# Patient Record
Sex: Female | Born: 1955 | Race: White | Hispanic: No | Marital: Married | Smoking: Current every day smoker
Health system: Southern US, Community
[De-identification: ages and names within clinical notes are randomized; demographics above are authoritative.]

## PROBLEM LIST (undated history)

## (undated) DIAGNOSIS — E785 Hyperlipidemia, unspecified: Secondary | ICD-10-CM

## (undated) DIAGNOSIS — I2510 Atherosclerotic heart disease of native coronary artery without angina pectoris: Secondary | ICD-10-CM

## (undated) DIAGNOSIS — N809 Endometriosis, unspecified: Secondary | ICD-10-CM

## (undated) DIAGNOSIS — I959 Hypotension, unspecified: Secondary | ICD-10-CM

## (undated) DIAGNOSIS — C4490 Unspecified malignant neoplasm of skin, unspecified: Secondary | ICD-10-CM

## (undated) DIAGNOSIS — M5020 Other cervical disc displacement, unspecified cervical region: Secondary | ICD-10-CM

## (undated) DIAGNOSIS — I10 Essential (primary) hypertension: Secondary | ICD-10-CM

## (undated) DIAGNOSIS — Z720 Tobacco use: Secondary | ICD-10-CM

## (undated) HISTORY — PX: ABDOMINAL HYSTERECTOMY: SHX81

## (undated) HISTORY — PX: CARDIAC CATHETERIZATION: SHX172

## (undated) HISTORY — PX: TONSILLECTOMY: SUR1361

## (undated) HISTORY — PX: CHOLECYSTECTOMY: SHX55

---

## 1998-08-06 ENCOUNTER — Encounter: Payer: Self-pay | Admitting: Neurosurgery

## 1998-08-06 ENCOUNTER — Ambulatory Visit (HOSPITAL_COMMUNITY): Admission: RE | Admit: 1998-08-06 | Discharge: 1998-08-06 | Payer: Self-pay | Admitting: Neurosurgery

## 1998-08-15 ENCOUNTER — Ambulatory Visit (HOSPITAL_COMMUNITY): Admission: RE | Admit: 1998-08-15 | Discharge: 1998-08-15 | Payer: Self-pay | Admitting: Neurosurgery

## 1998-09-10 ENCOUNTER — Encounter
Admission: RE | Admit: 1998-09-10 | Discharge: 1998-12-09 | Payer: Self-pay | Admitting: Physical Medicine & Rehabilitation

## 2002-01-29 ENCOUNTER — Emergency Department (HOSPITAL_COMMUNITY): Admission: EM | Admit: 2002-01-29 | Discharge: 2002-01-29 | Payer: Self-pay | Admitting: *Deleted

## 2002-01-29 ENCOUNTER — Encounter: Payer: Self-pay | Admitting: *Deleted

## 2002-08-21 ENCOUNTER — Encounter: Payer: Self-pay | Admitting: General Surgery

## 2002-08-21 ENCOUNTER — Encounter: Payer: Self-pay | Admitting: Emergency Medicine

## 2002-08-21 ENCOUNTER — Inpatient Hospital Stay (HOSPITAL_COMMUNITY): Admission: EM | Admit: 2002-08-21 | Discharge: 2002-08-25 | Payer: Self-pay | Admitting: Emergency Medicine

## 2003-01-31 ENCOUNTER — Encounter: Payer: Self-pay | Admitting: General Surgery

## 2003-01-31 ENCOUNTER — Ambulatory Visit (HOSPITAL_COMMUNITY): Admission: RE | Admit: 2003-01-31 | Discharge: 2003-01-31 | Payer: Self-pay | Admitting: General Surgery

## 2003-02-13 ENCOUNTER — Ambulatory Visit (HOSPITAL_COMMUNITY): Admission: RE | Admit: 2003-02-13 | Discharge: 2003-02-13 | Payer: Self-pay | Admitting: General Surgery

## 2003-02-14 ENCOUNTER — Ambulatory Visit (HOSPITAL_COMMUNITY): Admission: RE | Admit: 2003-02-14 | Discharge: 2003-02-14 | Payer: Self-pay | Admitting: General Surgery

## 2003-02-14 ENCOUNTER — Encounter: Payer: Self-pay | Admitting: General Surgery

## 2003-02-18 ENCOUNTER — Encounter (HOSPITAL_COMMUNITY): Admission: RE | Admit: 2003-02-18 | Discharge: 2003-03-20 | Payer: Self-pay | Admitting: General Surgery

## 2003-02-18 ENCOUNTER — Encounter: Payer: Self-pay | Admitting: General Surgery

## 2003-03-15 ENCOUNTER — Observation Stay (HOSPITAL_COMMUNITY): Admission: RE | Admit: 2003-03-15 | Discharge: 2003-03-16 | Payer: Self-pay | Admitting: General Surgery

## 2005-07-16 ENCOUNTER — Emergency Department (HOSPITAL_COMMUNITY): Admission: EM | Admit: 2005-07-16 | Discharge: 2005-07-16 | Payer: Self-pay | Admitting: Emergency Medicine

## 2006-01-13 ENCOUNTER — Encounter (INDEPENDENT_AMBULATORY_CARE_PROVIDER_SITE_OTHER): Payer: Self-pay | Admitting: Family Medicine

## 2006-01-25 ENCOUNTER — Ambulatory Visit (HOSPITAL_COMMUNITY): Admission: RE | Admit: 2006-01-25 | Discharge: 2006-01-25 | Payer: Self-pay | Admitting: Preventative Medicine

## 2006-01-28 ENCOUNTER — Ambulatory Visit (HOSPITAL_COMMUNITY): Admission: RE | Admit: 2006-01-28 | Discharge: 2006-01-28 | Payer: Self-pay | Admitting: General Surgery

## 2006-03-30 ENCOUNTER — Encounter: Payer: Self-pay | Admitting: Neurosurgery

## 2006-07-05 ENCOUNTER — Encounter (INDEPENDENT_AMBULATORY_CARE_PROVIDER_SITE_OTHER): Payer: Self-pay | Admitting: Family Medicine

## 2006-07-14 ENCOUNTER — Ambulatory Visit: Payer: Self-pay | Admitting: Family Medicine

## 2006-07-15 ENCOUNTER — Encounter (INDEPENDENT_AMBULATORY_CARE_PROVIDER_SITE_OTHER): Payer: Self-pay | Admitting: Family Medicine

## 2006-07-15 LAB — CONVERTED CEMR LAB
Cholesterol: 242 mg/dL
HDL: 44 mg/dL
LDL Cholesterol: 159 mg/dL

## 2006-07-25 ENCOUNTER — Ambulatory Visit: Payer: Self-pay | Admitting: Family Medicine

## 2006-07-25 ENCOUNTER — Ambulatory Visit (HOSPITAL_COMMUNITY): Admission: RE | Admit: 2006-07-25 | Discharge: 2006-07-25 | Payer: Self-pay | Admitting: Family Medicine

## 2006-07-26 ENCOUNTER — Ambulatory Visit: Payer: Self-pay | Admitting: Family Medicine

## 2006-08-16 ENCOUNTER — Ambulatory Visit: Payer: Self-pay | Admitting: Family Medicine

## 2006-09-23 ENCOUNTER — Inpatient Hospital Stay (HOSPITAL_COMMUNITY): Admission: EM | Admit: 2006-09-23 | Discharge: 2006-09-25 | Payer: Self-pay | Admitting: Emergency Medicine

## 2006-09-30 ENCOUNTER — Encounter (INDEPENDENT_AMBULATORY_CARE_PROVIDER_SITE_OTHER): Payer: Self-pay | Admitting: Family Medicine

## 2006-10-19 DIAGNOSIS — J45909 Unspecified asthma, uncomplicated: Secondary | ICD-10-CM | POA: Insufficient documentation

## 2006-10-19 DIAGNOSIS — M545 Low back pain, unspecified: Secondary | ICD-10-CM | POA: Insufficient documentation

## 2006-10-19 DIAGNOSIS — J42 Unspecified chronic bronchitis: Secondary | ICD-10-CM | POA: Insufficient documentation

## 2006-10-19 DIAGNOSIS — R7989 Other specified abnormal findings of blood chemistry: Secondary | ICD-10-CM | POA: Insufficient documentation

## 2006-10-19 DIAGNOSIS — F17200 Nicotine dependence, unspecified, uncomplicated: Secondary | ICD-10-CM | POA: Insufficient documentation

## 2006-10-19 DIAGNOSIS — I2510 Atherosclerotic heart disease of native coronary artery without angina pectoris: Secondary | ICD-10-CM

## 2006-10-19 DIAGNOSIS — E785 Hyperlipidemia, unspecified: Secondary | ICD-10-CM

## 2006-10-28 ENCOUNTER — Ambulatory Visit: Payer: Self-pay | Admitting: Family Medicine

## 2007-01-27 ENCOUNTER — Ambulatory Visit: Payer: Self-pay | Admitting: Family Medicine

## 2007-01-27 DIAGNOSIS — K219 Gastro-esophageal reflux disease without esophagitis: Secondary | ICD-10-CM

## 2007-01-27 LAB — CONVERTED CEMR LAB

## 2007-02-06 ENCOUNTER — Ambulatory Visit (HOSPITAL_COMMUNITY): Admission: RE | Admit: 2007-02-06 | Discharge: 2007-02-06 | Payer: Self-pay | Admitting: Family Medicine

## 2007-02-06 ENCOUNTER — Encounter (INDEPENDENT_AMBULATORY_CARE_PROVIDER_SITE_OTHER): Payer: Self-pay | Admitting: Family Medicine

## 2007-02-14 ENCOUNTER — Encounter (INDEPENDENT_AMBULATORY_CARE_PROVIDER_SITE_OTHER): Payer: Self-pay | Admitting: Family Medicine

## 2007-04-20 ENCOUNTER — Encounter (INDEPENDENT_AMBULATORY_CARE_PROVIDER_SITE_OTHER): Payer: Self-pay | Admitting: Family Medicine

## 2007-04-25 ENCOUNTER — Ambulatory Visit: Payer: Self-pay | Admitting: Family Medicine

## 2007-04-25 LAB — CONVERTED CEMR LAB: HDL goal, serum: 40 mg/dL

## 2007-06-06 ENCOUNTER — Ambulatory Visit: Payer: Self-pay | Admitting: Family Medicine

## 2007-06-06 DIAGNOSIS — G2581 Restless legs syndrome: Secondary | ICD-10-CM | POA: Insufficient documentation

## 2007-06-09 ENCOUNTER — Telehealth (INDEPENDENT_AMBULATORY_CARE_PROVIDER_SITE_OTHER): Payer: Self-pay | Admitting: *Deleted

## 2007-06-09 ENCOUNTER — Encounter (INDEPENDENT_AMBULATORY_CARE_PROVIDER_SITE_OTHER): Payer: Self-pay | Admitting: Family Medicine

## 2007-06-09 LAB — CONVERTED CEMR LAB
Albumin: 4.4 g/dL (ref 3.5–5.2)
CO2: 22 meq/L (ref 19–32)
Calcium: 9.5 mg/dL (ref 8.4–10.5)
Chloride: 105 meq/L (ref 96–112)
Cholesterol: 242 mg/dL — ABNORMAL HIGH (ref 0–200)
Glucose, Bld: 92 mg/dL (ref 70–99)
Potassium: 4.1 meq/L (ref 3.5–5.3)
Sodium: 142 meq/L (ref 135–145)
Total Protein: 7.3 g/dL (ref 6.0–8.3)
Triglycerides: 149 mg/dL (ref <150)

## 2007-06-26 ENCOUNTER — Ambulatory Visit: Payer: Self-pay | Admitting: Family Medicine

## 2007-08-07 ENCOUNTER — Ambulatory Visit: Payer: Self-pay | Admitting: Family Medicine

## 2007-11-07 ENCOUNTER — Ambulatory Visit: Payer: Self-pay | Admitting: Family Medicine

## 2007-11-10 ENCOUNTER — Encounter (INDEPENDENT_AMBULATORY_CARE_PROVIDER_SITE_OTHER): Payer: Self-pay | Admitting: Family Medicine

## 2007-11-10 ENCOUNTER — Telehealth (INDEPENDENT_AMBULATORY_CARE_PROVIDER_SITE_OTHER): Payer: Self-pay | Admitting: *Deleted

## 2007-11-10 LAB — CONVERTED CEMR LAB
AST: 12 units/L (ref 0–37)
Albumin: 4.5 g/dL (ref 3.5–5.2)
BUN: 7 mg/dL (ref 6–23)
Calcium: 9.9 mg/dL (ref 8.4–10.5)
Chloride: 106 meq/L (ref 96–112)
Glucose, Bld: 92 mg/dL (ref 70–99)
HDL: 60 mg/dL (ref >39)
Potassium: 4 meq/L (ref 3.5–5.3)
Total Protein: 7.2 g/dL (ref 6.0–8.3)
Triglycerides: 141 mg/dL (ref <150)

## 2008-08-15 ENCOUNTER — Emergency Department (HOSPITAL_COMMUNITY): Admission: EM | Admit: 2008-08-15 | Discharge: 2008-08-15 | Payer: Self-pay | Admitting: Emergency Medicine

## 2009-05-29 ENCOUNTER — Ambulatory Visit: Payer: Self-pay | Admitting: Family Medicine

## 2009-06-05 ENCOUNTER — Ambulatory Visit: Payer: Self-pay | Admitting: Family Medicine

## 2009-06-05 DIAGNOSIS — C4490 Unspecified malignant neoplasm of skin, unspecified: Secondary | ICD-10-CM

## 2009-06-09 ENCOUNTER — Ambulatory Visit: Payer: Self-pay | Admitting: Family Medicine

## 2009-08-05 ENCOUNTER — Encounter (INDEPENDENT_AMBULATORY_CARE_PROVIDER_SITE_OTHER): Payer: Self-pay | Admitting: Family Medicine

## 2011-04-16 NOTE — H&P (Signed)
NAME:  [name], [name] NO.:  [id number]   MEDICAL RECORD NO.:  [id number]           PATIENT TYPE:  AMB   LOCATION:                                FACILITY:  [facility]   PHYSICIAN:  Dalia Heading, M.D.  DATE OF BIRTH:  Apr 30, 1956   DATE OF ADMISSION:  DATE OF DISCHARGE:  LH                                HISTORY & PHYSICAL   CHIEF COMPLAINT:  Cecal mass, family history of colon carcinoma.   HISTORY OF PRESENT ILLNESS:  The patient is a 55 year old white female who  is referred for endoscopic evaluation.  She needs colonoscopy for a cecal  mass seen on CT scan of the abdomen.  She has been having right-sided  abdominal pain for the past two weeks.  She does have nausea but no  vomiting.  She is status post a cholecystectomy in the past.  She currently  has no weight loss, vomiting, diarrhea, constipation, melena, hematochezia.  She has never had a colonoscopy.  Multiple family members have a history of  colon carcinoma.   PAST MEDICAL HISTORY:  For the most part, unremarkable.   PAST SURGICAL HISTORY:  Cholecystectomy.   CURRENT MEDICATIONS:  Vicodin.   ALLERGIES:  No known drug allergies.   REVIEW OF SYSTEMS:  The patient smokes half a pack of cigarettes a day.  She  denies any alcohol use.  She denies any other cardio or pulmonary  difficulties or bleeding disorders.   PHYSICAL EXAMINATION:  GENERAL:  The patient is a well-developed, well-  nourished white female in no acute distress.  LUNGS:  Clear to auscultation with equal breath sounds bilaterally.  HEART:  Regular rate and rhythm without S3, S4, or murmurs.  ABDOMEN:  Soft, nontender, nondistended.  No hepatosplenomegaly or masses  are noted.  RECTAL:  Deferred to the procedure.   IMPRESSION:  1.  Cecal neoplasm, unspecified.  2.  Family history of colon carcinoma.   PLAN:  The patient was scheduled for a colonoscopy on December 31, 2005.  The  risks and benefits of the procedure including bleeding  and perforation were  fully explained to the patient who gave informed consent.      Dalia Heading, M.D.  Electronically Signed     MAJ/MEDQ  D:  01/27/2006  T:  01/27/2006  Job:  [id number]   cc:   [facility]  Fax: [phone]   Laverle Hobby, M.D.  [address]  [place], [place] [address]

## 2011-04-16 NOTE — Consult Note (Signed)
[name], [name] NO.:  [id number]   MEDICAL RECORD NO.:  [id number]          PATIENT TYPE:  INP   LOCATION:  3735                         FACILITY:  [facility]   PHYSICIAN:  Graylin Shiver, M.D.   DATE OF BIRTH:  1956/07/28   DATE OF CONSULTATION:  DATE OF DISCHARGE:                                   CONSULTATION   DATE OF CONSULTATION:  September 24, 2006   REASON FOR CONSULTATION:  The patient is a 55 year old Caucasian female  admitted to the hospital because of chest pain and an abnormal outpatient  cardiac imaging study.  She underwent cardiac catheterization yesterday.   GI is asked to see the patient because when she eats, she cannot eat much.  She states that after a few bites she takes a deep breath and just cannot  eat anymore for awhile.  She denies dysphagia, odynophagia, nausea,  vomiting, no change in bowel movements, no bleeding.  She gives me no  history of peptic ulcer disease.   PAST HISTORY:  Surgeries:  Hysterectomy, tonsillectomy, cholecystectomy.  Allergies:  CORTISONE.  Medications:  Toprol, aspirin.   Patient had a colonoscopy earlier this year in [place] by a  gastroenterologist there.  She said she had a few polyps.  She does not  remember the name of the gastroenterologist.   REVIEW OF SYSTEMS:  She is currently not having chest pain today or  shortness of breath at this time.  She does not complain of hematemesis,  melena or hematochezia.   PHYSICAL EXAMINATION:  VITAL SIGNS:  Stable.  GENERAL:  She does not appear  in any acute distress.  She is nonicteric.  NECK:  Supple.  HEART:  Regular rhythm, no murmurs.  LUNGS:  Clear.  ABDOMEN:  Bowel sounds are normal, soft, nontender, no hepatosplenomegaly.   IMPRESSION:  1. Chest pain, etiology unclear.  2. Patient reports that when she eats, she cannot eat much and after      taking a few bites she has to take a deep breath and, thus, stops      eating.   PLAN:  I think that  the patient can be further evaluated as an outpatient.  I told her that after she is discharged from the hospital to call [facility]  to make a followup appointment to see me or if it is more convenient for  her, she can  follow up with her gastroenterologist in [place].  At a minimum, I would  do an upper GI series.  It is possible that she could have a hiatal hernia  which is causing her symptoms.  Patient understands and hopefully will be  discharged tomorrow.           ______________________________  Graylin Shiver, M.D.     SFG/MEDQ  D:  09/24/2006  T:  09/25/2006  Job:  [id number]   cc:   Dani Gobble, MD

## 2011-04-16 NOTE — Discharge Summary (Signed)
[name], [name]                   ACCOUNT NO.:  [id number]   MEDICAL RECORD NO.:  [id number]          PATIENT TYPE:  INP   LOCATION:  3735                         FACILITY:  [facility]   PHYSICIAN:  Dani Gobble, MD       DATE OF BIRTH:  Feb 09, 1956   DATE OF ADMISSION:  09/23/2006  DATE OF DISCHARGE:  09/25/2006                                 DISCHARGE SUMMARY   DISCHARGE DIAGNOSES:  1. Chest pain, noncardiac despite a positive Myoview with anterior      ischemia on cardiac cath with nonobstructive coronary disease.  2. Hyperlipidemia.  3. Positive tobacco use.  4. Dysphagia.  5. Hypotension.  6. Significant bradycardia on beta blocker, has stopped beta blocker.  7. Carotid bruit on the left.   HISTORY OF PRESENT ILLNESS:  [age]-old white female with  nonobstructive coronary disease by cath in [facility] in February of  2003 with 50% RCA, 30% left main, 40% LAD proximal.  Has been having chest  tightness, but this has been present since that cath in 2003.  It is  associated with shortness of breath at times.  On September 22, 2006, she  underwent Persantine Myoview at [facility], developed  significant chest tightness with procedure that never went away.  The  tightness now with no associated symptoms.  She was at work today when her  office called her and asked how she was, when she complained of the  tightness, she was instructed to come to the ER ASAP.  Her Persantine  Myoview was positive for anterior ischemia.  Here in the ER, she continued  with chest discomfort, we put her on IV nitro and heparin.   PAST MEDICAL HISTORY:  As stated:  1. Hypertension.  2. Hyperlipidemia.  3. Irritable bowel syndrome.  4. Peptic ulcer disease and reflux disease.  5. Migraine headaches.  6. She has a history of cholecystectomy.  7. Total abdominal hysterectomy with bilateral salpingo-oophorectomy.  8. Tonsillectomy.  9. History of an LDL of 159.   ALLERGIES:  CORTISONE INJECTION CAUSES SWELLING AND HIVES.   OUTPATIENT MEDICATIONS:  1. Metoprolol 25 b.i.d.  2. Aspirin 81 daily.   FAMILY HISTORY:  See H&P.   SOCIAL HISTORY:  See H&P.   REVIEW OF SYSTEMS:  See H&P.   PHYSICAL EXAMINATION AT DISCHARGE:  VITAL SIGNS:  Blood pressure 108 to  102/62.  Pulse 46 and had been down to 39.  Oxygen saturation 98%.  Temp  97.6.  HEART:  S1 and S2.  Regular rate and rhythm.  LUNGS:  Clear.  ABDOMEN:  Positive bowel sounds.  EXTREMITIES:  Without edema.  Groin cath site stable.   LABORATORY DATA:  Hemoglobin 14.5, hematocrit 42.3, platelets 248,  neutrophils 40 and lymphs 51, mono 5, EO2 base of 1, ProTime 13.3, INR of  1.0, PTT 27, heparin infusion prior to her cath.   Chemistry:  Sodium 144, potassium 3.5 hypokalemia was replaced, chloride  111, CO2 27, glucose 99, BUN 3, creatinine 0.8, calcium 9.3, total protein  6.9, albumin 3.8, AST 19, ALT  13, ALP 79, total bili 0.7, magnesium 2.4.   CK is 71, 49 and 15.  MB 0.7, 0.4.  Troponin I 0.03 to 0.01.   Cholesterol, and this was done after one dose of Zocor and after her cath,  cholesterol 29, triglycerides 141, HDL 37 and LDL 64.  TSH was normal at  1.225.  UA had trace leukocytes, few bacteria, but otherwise clear.   EKGs revealed sinus bradycardia and, otherwise, normal EKGs.   PROCEDURE:  Cardiac catheterization, September 23, 2006, by Dr. Lenise Herald, 50% RCA, 30% LAD, there was no left main stenosis documented.  She  was closed with Star close with 1 gram of Ancef IV given.  PPI was added to  medical treatment and she was admitted to telemetry.   HOSPITAL COURSE:  September 24, 2006, patient could hardly move, she felt so  tired and there were other concerns about her dysphagia.  Pulses were normal  in her feet.  We also discussed her heart rate, which would drop down into  the 30s.  Her Lopressor had been held since arrival to 3700.  We continued  to hold it.  Her blood  pressure would also drop down to the 80s at times.  Lisinopril had been added to her medical regimen after the cath.   We did call a GI consult, Dr. Herbert Moors, saw her consult.  He felt she  could be worked up as an outpatient from GI.  They would like to stick with  either Dr. Evette Cristal or Dr. Matthias Hughs.  They will seem them as an outpatient.   By the next morning, September 25, 2006, patient was stable.  Blood pressure  was 108 as stated.  Pulse did drop to 39 occasionally, but she had been  without beta blocker for almost 48 hours.  She was no longer dizzy, tired or  fatigue.  She could walk without problems.  Dr. Elsie Lincoln, on call for Dr.  Domingo Sep, felt she was stable to be discharged home.  She will followup as  an outpatient.  We did discontinue her metoprolol and decreased her  lisinopril though.   DISCHARGE MEDICATIONS:  1. Stop Lopressor.  2. Aspirin 81 mg daily.  3. Lisinopril 5 mg daily.  4. Protonix 40 daily.  5. Zocor 20 mg daily.  6. No smoking.   DISCHARGE INSTRUCTIONS:  1. Low-fat diet.  2. No driving for one day.  3. No lifting for one day.  4. No work for one week.  5. Wash right groin cath site with soap and water.  Call if any bleeding,      swelling or drainage.  6. Follow up with Dr. Domingo Sep October 03, 2006 as before.  7. Call [facility] with for appointment with Dr. Evette Cristal or Dr. Matthias Hughs.   The patient was then discharged home without further issues.      Darcella Gasman. Annie Paras, N.P.    ______________________________  Dani Gobble, MD    LRI/MEDQ  D:  09/25/2006  T:  09/26/2006  Job:  [id number]   cc:   Joellyn Quails, M.D.  Darlin Priestly, MD  Graylin Shiver, M.D.

## 2011-04-16 NOTE — Op Note (Signed)
   NAME:  [name], [name]                             ACCOUNT NO.:  [id number]   MEDICAL RECORD NO.:  [id number]                   PATIENT TYPE:  OBV   LOCATION:  A331                                 FACILITY:  [facility]   PHYSICIAN:  Dirk Dress. Katrinka Blazing, M.D.                DATE OF BIRTH:  1956/05/22   DATE OF PROCEDURE:  03/15/2003  DATE OF DISCHARGE:                                 OPERATIVE REPORT   PREOPERATIVE DIAGNOSIS:  Chronic cholecystitis.   POSTOPERATIVE DIAGNOSIS:  Chronic cholecystitis.   PROCEDURE:  Laparoscopic cholecystectomy.   SURGEON:  Dirk Dress. Katrinka Blazing, M.D.   DESCRIPTION OF PROCEDURE:  Under general anesthesia, the patient's abdomen  was prepped and draped in a sterile field.  A supraumbilical incision was  made and the Veress needle was inserted uneventfully.  The abdomen was  insufflated with 2 L of CO2.  Using a Visiport guide, a 10 mm port was  placed uneventfully.  The laparoscope was placed and the gallbladder was  visualized.  Under videoscopic guidance, a 10 mm port and two 5 mm ports  were placed in the right upper quadrant.  The gallbladder was grasped in  midposition.  The cystic artery was dissected, clipped with four clips, and  divided.  The cystic duct was dissected, clipped with five clips, and  divided.  Using electrocautery, the gallbladder was separated from the  intrahepatic bed without difficulty.  There was minimal bleeding.  The  gallbladder was grasped and retrieved.  Copious irrigation was carried out  until the fluid returned clear.  There was no bleeding, and there was no  evidence of bile leak.  The ductal structures were inspected and were  intact.  CO2 was allowed to escape from the abdomen, and the ports were  removed.  The fascia of the larger ports was closed with 0 Dexon.  The skin  was closed with staples.  Sterile dressings were placed.  The patient was  awakened from anesthesia uneventfully, transferred to a bed, and taken to  the  postanesthetic care unit.                                               Dirk Dress. Katrinka Blazing, M.D.    LCS/MEDQ  D:  03/15/2003  T:  03/15/2003  Job:  [id number]

## 2011-04-16 NOTE — H&P (Signed)
   NAME:  [name], [name]                             ACCOUNT NO.:  [id number]   MEDICAL RECORD NO.:  [id number]                   PATIENT TYPE:  AMB   LOCATION:  DAY                                  FACILITY:  [facility]   PHYSICIAN:  Jerolyn Shin C. Katrinka Blazing, M.D.                DATE OF BIRTH:  July 06, 1956   DATE OF ADMISSION:  DATE OF DISCHARGE:                                HISTORY & PHYSICAL   HISTORY OF PRESENT ILLNESS:  [age]-year-old female with history of  recurrent substernal pain, burping, epigastric discomfort, postprandial  fullness.  The patient was evaluated and was found to have a normal  gallbladder ultrasound, however, she had a HIDA scan with ejection fraction  of 26%.  She underwent EGD which only showed mild gastritis.  The patient  has continued to remain symptomatic and is now scheduled for laparoscopic  cholecystectomy.   PAST HISTORY:  She has gastroesophageal reflux disease, peptic ulcer  disease, irritable bowel syndrome, lumbar disk disease with severe low back  pain and radiculopathy, and chronic anxiety.   SURGERIES:  Total abdominal hysterectomy, bilateral salpingo-oophorectomy  and tonsillectomy.   ALLERGIES:  CORTISONE.   SOCIAL HISTORY:  The patient is married.  She is employed as a Engineer, site.  She smokes one-half pack of cigarettes per day.  She does not drink  or use drugs.   PHYSICAL EXAMINATION:  VITAL SIGNS:  Blood pressure 120/80, pulse 70,  respirations 20.  Weight 166 pounds.  HEENT:  Unremarkable.  NECK:  Neck is supple.  No JVD or bruit.  She has tenderness posteriorly in  the midline.  She has pain with range of motion.  CHEST:  Chest clear to auscultation.  HEART:  Heart regular rate and rhythm without murmur, gallop or rub.  ABDOMEN:  Moderate epigastric and right upper quadrant tenderness.  Normal  bowel sounds.  No masses.  EXTREMITIES:  No joint deformity.  No cyanosis, clubbing or edema.  NEUROLOGIC:  No motor, sensory or cerebellar  deficit, however, she does have  positive straight leg raising bilaterally at about 60 degrees.   IMPRESSION:  1. Chronic cholecystitis with recurrent biliary colic.  2. Gastroesophageal reflux disease.  3. Peptic ulcer disease.  4. Irritable bowel syndrome.  5.     Severe lumbar disk disease.  6. Chronic anxiety.   PLAN:  Laparoscopic cholecystectomy.                                                Dirk Dress. Katrinka Blazing, M.D.    LCS/MEDQ  D:  03/14/2003  T:  03/15/2003  Job:  [id number]

## 2011-04-16 NOTE — Cardiovascular Report (Signed)
NAMESHIMA, COMPERE NO.:  [id number]   MEDICAL RECORD NO.:  [id number]          PATIENT TYPE:  INP   LOCATION:  3735                         FACILITY:  [facility]   PHYSICIAN:  Darlin Priestly, MD  DATE OF BIRTH:  [birth year]   DATE OF PROCEDURE:  DATE OF DISCHARGE:                              CARDIAC CATHETERIZATION   DATE OF PROCEDURE:  September 23, 2006   OPERATIVE PROCEDURES:  1. Left heart catheterization.  2. Coronary angiography.  3. Left ventriculogram.  4. Right femoral StarClose.   ATTENDING:  Dr. Lenise Herald   COMPLICATIONS:  None.   INDICATIONS:  Ms. Demers is a 55 year old female, patient of Dr. Kem Boroughs and Dr. Erby Pian, with a history of ongoing tobacco use,  hypertension, hyperlipidemia, history of catheterization in 2003 in  [place] with scattered noncritical disease.  Recently she is complaining  of increasing chest pain.  She underwent a Cardiolite scan on September 22, 2006, suggesting anterior wall ischemia from the mid ventricle to the apex.  She is now brought for cardiac catheterization to rule out significant CAD.   DESCRIPTION OF OPERATION:  After giving informed written consent, the  patient brought to the cardiac cath lab where right groin was shaved,  prepped, and draped in the sterile fashion.  Anesthesia monitoring  established.  Using a modified Seldinger technique, a #6-French arterial  sheath inserted in the right femoral artery.  A #6-French diagnostic  catheter used to perform diagnostic angiography.   The left main is a medium to large size vessel with no significant disease.   The LAD is a medium vessel which courses to give access to 1 bifurcating  diagonal branch.  The LAD has mild 30% narrowing after the takeoff of the  first diagonal.   The first diagonal is a medium size vessel which bifurcates in its very  proximal portion.  There is no significant disease in the diagonal.   The left circumflex is  a medium size vessel which courses in the AV groove.  It gives rise to 2 obtuse marginal branches.  The AV circumflex has no  significant disease.   The first and second OMs are small vessels with no significant disease.   The third OM is a medium size vessel which bifurcates in the mid segment  with no significant disease.   The right coronary artery is a large vessel which is dominant and gives rise  to both PDA and well as posterolateral branch.  The RCA is noted to have 50%  to 60% mid vessel stenosis, however, this did not appear to be flow-  limiting.  There is TIMI 3 flow in the distal vessel.   PDA and posterolateral branch are both medium size vessels with no  significant disease.   The left ventriculogram reveals a preserved EF of 50%.  There is significant  ectopy noted.   Following conclusion of the case, the right femoral site was then closed  using a StarClose device without complications.  One gram of Ancef was given  prophylactically.   CONCLUSION:  1. Noncritical coronary artery disease.  2. Low normal ejection fraction.  3. Successful closure of the right femoral site using a StarClose device      with 1 g of Ancef given prophylactically.      Darlin Priestly, MD  Electronically Signed     RHM/MEDQ  D:  09/23/2006  T:  09/24/2006  Job:  [id number]   cc:   Dani Gobble, MD  Dr. Erby Pian

## 2011-04-16 NOTE — Op Note (Signed)
   NAME:  [name], [name]                             ACCOUNT NO.:  [id number]   MEDICAL RECORD NO.:  [id number]                   PATIENT TYPE:  INP   LOCATION:  A301                                 FACILITY:  [facility]   PHYSICIAN:  Dirk Dress. Katrinka Blazing, M.D.                DATE OF BIRTH:  [birth year]   DATE OF PROCEDURE:  DATE OF DISCHARGE:                                 OPERATIVE REPORT   PREOPERATIVE DIAGNOSIS:  Right adnexal mass.   POSTOPERATIVE DIAGNOSIS:  Hemorrhagic ruptured ovarian cyst right ovary.   PROCEDURE:  Bilateral salpingo-oophorectomy.   SURGEON:  Dirk Dress. Katrinka Blazing, M.D.   DESCRIPTION OF PROCEDURE:  Under general endotracheal anesthesia the  patient's abdomen was prepped and draped in a sterile field.  Pfannenstiel  incision was made.  Upon entering the abdomen there was free fluid in the  pelvis with a hemorrhagic right ovary with an apparent opening where it had  ruptured.  The left ovary was mildly enlarged, but otherwise was  unremarkable.  She had tubes bilaterally which were very inflamed and  reddened, but there was no evidence of infection nor was there any evidence  of obstruction of hydrosalpinx.  The upper abdomen was unremarkable.  Gallbladder appeared to be normal.  Liver was normal to palpation.  The  patient was placed in Trendelenburg position.  The abdomen was packed off.  The left tuboovarian complex was doubly clamped with the Kelly clamps,  divided, and controlled with ligatures of #0 Dexon.  The right tuboovarian  complex was doubly clamped with Kelly clamps, divided, and controlled with  ligatures of #0 Dexon.  Irrigation of the abdomen was carried out  uneventfully.  There was no bleeding from the opposite site.  The cecum was  mobilized and was inspected.  There was evidence of a previous appendectomy  with the scar in the area of the appendiceal stump.  No other abnormality of  the cecum was noted.  The cecum was very mobile and extended down into the  pelvis.  Sponge, needle, and instrument and blade counts were verified as  correct x2.  The abdomen was closed using #0 Biosyn on the muscle and  peritoneum, #1 Prolene on the fascia, 2-0 Biosyn in the subcutaneous tissue  and 4-0 Dexon on the skin.  Sterile dressing was placed.  She was awakened  from anesthesia uneventfully, transferred to a bed and taken to the  postanesthetic care unit.                                                Dirk Dress. Katrinka Blazing, M.D.    LCS/MEDQ  D:  08/22/2002  T:  08/22/2002  Job:  [id number]

## 2011-04-16 NOTE — H&P (Signed)
NAME:  [name], [name]                             ACCOUNT NO.:  [id number]   MEDICAL RECORD NO.:  [id number]                   PATIENT TYPE:  EMS   LOCATION:  ED                                   FACILITY:  [facility]   PHYSICIAN:  Dirk Dress. Katrinka Blazing, M.D.                DATE OF BIRTH:  1956-05-12   DATE OF ADMISSION:  08/21/2002  DATE OF DISCHARGE:                                HISTORY & PHYSICAL   HISTORY OF PRESENT ILLNESS:  A 55 year old female with a history of acute  onset of severe pain in the right lower quadrant on Saturday.  The pain  became more intense and she developed nausea with vomiting.  The nausea and  vomiting persisted and was quite severe yesterday and today.  She describes  the pain as a very sharp jabbing-type pain.  She has pain with walking.  She  denies upper abdominal pain.  She denies fever.  She has had some cold  chills.  She has mild tenderness in her right CVA area.  There is no history  of injury.  The patient was seen in the emergency room where she was noted  to have an exquisitely tender abdomen without fever or leukocytosis.  CT  reveals a complex cyst of the right adnexa, 3.7 x 2.8 cm.  The patient has  exquisite tenderness on exam and will need to have pelvic laparotomy.   PAST MEDICAL HISTORY:  The patient has a history of a ruptured disk in her  neck.  She relates that she has at least three disks, which she has had for  many years.  There is no immediate documentation of this.  She does,  however, have tingling and numbness of her left arm extending down into her  left hand.  This is an intermittent occurrence and often times resolves  spontaneously.  She has atherosclerotic heart disease, which is very mild.  She had a cardiac catheterization in February of 2003 and was found to have  very mild three-vessel disease with stenosis in the range of 30%, 40%, and  less than 50%.  She has been medically treated for this and is on no  medication for it at  this time.   PAST SURGICAL HISTORY:  Hysterectomy and tonsillectomy.   MEDICATIONS:  She is on no chronic medications.   ALLERGIES:  CORTISONE, which she states causes edema and hives.   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  She has  three children.  She smokes at least a half of a packs of cigarettes per  day.  She does not drink or use drugs.   PHYSICAL EXAMINATION:  GENERAL APPEARANCE:  She appears to be in moderate  distress even though she has had 10 mg of morphine and 25 mg of Phenergan IV  for pain.  VITAL SIGNS:  Blood pressure  120/70, pulse 60, respirations 20, temperature  97.1 degrees.  HEENT:  Unremarkable.  NECK:  Supple without JVD or bruit.  There is mild tenderness posteriorly at  the base of the neck.  She has good range of motion of the neck on flexion,  extension, and rotation.  CHEST:  A few coarse rhonchi, which clear with cough.  A few basilar rales  bilaterally.  HEART:  Regular rate and rhythm without murmur, rub, or gallop.  ABDOMEN:  Nondistended.  Soft in most of the abdomen, but she has tenderness  with guarding in the right lower quadrant proceeding to the right side of  the suprapubic area.  PELVIC:  Bimanual reveals exquisite tenderness on the right side with  minimal tenderness on the left side.  She has significant guarding and it is  very difficult to palpate a definite mass.  RECTAL:  Very painful for her, though I do not feel a mass.  EXTREMITIES:  No cyanosis, clubbing, or edema.  No joint deformity.  NEUROLOGIC:  No focal motor, sensory, or cerebellar deficit.  I cannot  detect any strength deficit in the left upper extremity nor is there any  detectable numbness.   IMPRESSION:  1. Complex mass, right adnexa.  2. Chronic bronchitis.  3. 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  4. Atherosclerotic heart disease with three-vessel disease and less than 50%     stenosis in  each vessel.   PLAN:  She will be admitted for pain control and control of her nausea and  vomiting.  Will check a CBC in the morning.  Will proceed with sedimentation  rate and chest x-ray.  Will review her echocardiogram, stress test, and  cardiac catheterization if possible.  She will be scheduled for pelvic  laparotomy in the morning with plan to do probable bilateral oophorectomy.                                               Dirk Dress. Katrinka Blazing, M.D.    LCS/MEDQ  D:  08/21/2002  T:  08/21/2002  Job:  [id number]

## 2011-04-16 NOTE — Discharge Summary (Signed)
   NAME:  Rebecca, Burns NO.:  [id number]   MEDICAL RECORD NO.:  [id number]                    PATIENT TYPE:   LOCATION:                                       FACILITY:   PHYSICIAN:  Dirk Dress. Katrinka Blazing, M.D.                DATE OF BIRTH:   DATE OF ADMISSION:  08/21/2002  DATE OF DISCHARGE:  08/25/2002                                 DISCHARGE SUMMARY   DISCHARGE DIAGNOSES:  1. Hemorrhagic ovarian cyst, right adnexa.  2. Chronic bronchitis.  3. 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  4. Atherosclerotic heart disease with three-vessel disease, asymptomatic.   SPECIAL PROCEDURE:  Bilateral salpingo-oophorectomy.   DISPOSITION:  The patient discharged home in stable improved condition with  plans for follow-up in the office on September 06, 2002.   DISCHARGE MEDICATIONS:  1. Premarin 0.625 mg q.d.  2. Tylox one to two q.4h. as needed for pain.   SUMMARY:  A 55 year old female with a history of acute onset of severe pain  in the right lower quadrant.  The pain became more intense and she developed  nausea with vomiting.  The nausea and vomiting persisted.  She had no fever  or chills.  She also had mild tenderness in the right CVA region.  She was  seen in the emergency room with an exquisitely tender abdomen.  CT of the  abdomen revealed a complex mass of the right adnexa which was 3.7 x 2.8 cm.  The patient was counseled for possible laparotomy or laparoscopy.  Because  of previous surgery we elected to proceed with laparotomy.  This was done on  August 22, 2002.  A ruptured hemorrhagic ovarian cyst was found.  Bilateral salpingo-oophorectomy was done as the patient was previously  counseled.  She had nausea and vomiting in the early postoperative period  but otherwise did quite well.  She was discharged home on the morning of  post day #2 in satisfactory condition.                                               Dirk Dress.  Katrinka Blazing, M.D.    LCS/MEDQ  D:  11/17/2002  T:  11/19/2002  Job:  [id number]

## 2011-04-16 NOTE — H&P (Signed)
   NAME:  [name], [name]                             ACCOUNT NO.:  [id number]   MEDICAL RECORD NO.:  [id number]                   PATIENT TYPE:  AMB   LOCATION:  DAY                                  FACILITY:  [facility]   PHYSICIAN:  Jerolyn Shin C. Katrinka Blazing, M.D.                DATE OF BIRTH:  August 22, 1956   DATE OF ADMISSION:  DATE OF DISCHARGE:                                HISTORY & PHYSICAL   HISTORY OF PRESENT ILLNESS:  [age]-year-old female with history of  substernal pain, recurrent burping, epigastric discomfort and postprandial  fullness.  She has epigastric discomfort on exam.  She is felt to have  gastroesophageal reflux disease and peptic ulcer disease and is scheduled  for upper endoscopy.   PAST HISTORY:  Past history positive for irritable bowel syndrome, lumbar  disk disease with severe low back pain with radiculopathy, chronic anxiety.   SURGERY:  1. Total abdominal hysterectomy.  2. Bilateral salpingo-oophorectomy.  3. Tonsillectomy.   ALLERGIES:  CORTISONE.   SOCIAL HISTORY:  She is married, employed as a Electrical engineer.  Tobacco:  A  half a pack of cigarettes per day.  She does not drink or use drugs.   PHYSICAL EXAMINATION:  VITAL SIGNS:  Blood pressure 120/70, pulse 78,  respirations 20.  Weight 163 pounds.  HEENT:  Unremarkable.  NECK:  Positive tenderness on palpation posteriorly.  Positive tenderness  with range of motion, especially flexion and extension.  No thyromegaly or  adenopathy.  CHEST:  Chest clear to auscultation.  HEART:  Regular rate and rhythm without murmur, gallop or rub.  ABDOMEN:  Moderate epigastric tenderness.  Normal bowel sounds.  EXTREMITIES:  No cyanosis, clubbing or edema.  NEUROLOGIC:  Positive straight leg raising bilaterally, otherwise,  unremarkable.   IMPRESSION:  1. Peptic ulcer disease and gastroesophageal reflux disease.  2.     Chronic irritable bowel syndrome.  3. Chronic severe low back pain with radiculopathy.   PLAN:   The patient will have EGD, gallbladder ultrasound and HIDA scan.                                               Dirk Dress. Katrinka Blazing, M.D.    LCS/MEDQ  D:  02/12/2003  T:  02/13/2003  Job:  [id number]

## 2016-05-28 ENCOUNTER — Inpatient Hospital Stay (HOSPITAL_COMMUNITY)
Admission: EM | Admit: 2016-05-28 | Discharge: 2016-05-31 | DRG: 470 | Disposition: A | Discharge status: Home / Self Care | Payer: Managed Care, Other (non HMO) | Attending: Internal Medicine | Admitting: Internal Medicine

## 2016-05-28 ENCOUNTER — Encounter (HOSPITAL_COMMUNITY): Payer: Self-pay

## 2016-05-28 ENCOUNTER — Emergency Department (HOSPITAL_COMMUNITY): Payer: Managed Care, Other (non HMO)

## 2016-05-28 DIAGNOSIS — W108XXA Fall (on) (from) other stairs and steps, initial encounter: Secondary | ICD-10-CM | POA: Diagnosis present

## 2016-05-28 DIAGNOSIS — F17200 Nicotine dependence, unspecified, uncomplicated: Secondary | ICD-10-CM | POA: Diagnosis not present

## 2016-05-28 DIAGNOSIS — F17210 Nicotine dependence, cigarettes, uncomplicated: Secondary | ICD-10-CM | POA: Diagnosis present

## 2016-05-28 DIAGNOSIS — E876 Hypokalemia: Secondary | ICD-10-CM | POA: Diagnosis present

## 2016-05-28 DIAGNOSIS — D62 Acute posthemorrhagic anemia: Secondary | ICD-10-CM | POA: Diagnosis not present

## 2016-05-28 DIAGNOSIS — Z955 Presence of coronary angioplasty implant and graft: Secondary | ICD-10-CM | POA: Diagnosis not present

## 2016-05-28 DIAGNOSIS — J449 Chronic obstructive pulmonary disease, unspecified: Secondary | ICD-10-CM | POA: Diagnosis present

## 2016-05-28 DIAGNOSIS — E785 Hyperlipidemia, unspecified: Secondary | ICD-10-CM | POA: Diagnosis present

## 2016-05-28 DIAGNOSIS — K219 Gastro-esophageal reflux disease without esophagitis: Secondary | ICD-10-CM | POA: Diagnosis present

## 2016-05-28 DIAGNOSIS — Z96642 Presence of left artificial hip joint: Secondary | ICD-10-CM

## 2016-05-28 DIAGNOSIS — J42 Unspecified chronic bronchitis: Secondary | ICD-10-CM | POA: Diagnosis present

## 2016-05-28 DIAGNOSIS — I2510 Atherosclerotic heart disease of native coronary artery without angina pectoris: Secondary | ICD-10-CM | POA: Diagnosis not present

## 2016-05-28 DIAGNOSIS — I10 Essential (primary) hypertension: Secondary | ICD-10-CM | POA: Diagnosis present

## 2016-05-28 DIAGNOSIS — S72002A Fracture of unspecified part of neck of left femur, initial encounter for closed fracture: Principal | ICD-10-CM | POA: Diagnosis present

## 2016-05-28 DIAGNOSIS — Z888 Allergy status to other drugs, medicaments and biological substances status: Secondary | ICD-10-CM

## 2016-05-28 DIAGNOSIS — Z85828 Personal history of other malignant neoplasm of skin: Secondary | ICD-10-CM | POA: Diagnosis not present

## 2016-05-28 DIAGNOSIS — T148XXA Other injury of unspecified body region, initial encounter: Secondary | ICD-10-CM

## 2016-05-28 DIAGNOSIS — M810 Age-related osteoporosis without current pathological fracture: Secondary | ICD-10-CM | POA: Diagnosis present

## 2016-05-28 DIAGNOSIS — S72002D Fracture of unspecified part of neck of left femur, subsequent encounter for closed fracture with routine healing: Secondary | ICD-10-CM | POA: Diagnosis not present

## 2016-05-28 HISTORY — DX: Hypotension, unspecified: I95.9

## 2016-05-28 HISTORY — DX: Unspecified malignant neoplasm of skin, unspecified: C44.90

## 2016-05-28 HISTORY — DX: Tobacco use: Z72.0

## 2016-05-28 HISTORY — DX: Atherosclerotic heart disease of native coronary artery without angina pectoris: I25.10

## 2016-05-28 HISTORY — DX: Hyperlipidemia, unspecified: E78.5

## 2016-05-28 HISTORY — DX: Endometriosis, unspecified: N80.9

## 2016-05-28 HISTORY — DX: Other cervical disc displacement, unspecified cervical region: M50.20

## 2016-05-28 LAB — CBC WITH DIFFERENTIAL/PLATELET
Basophils Absolute: 0.1 10*3/uL (ref 0.0–0.1)
Basophils Relative: 1 %
EOS ABS: 0.1 10*3/uL (ref 0.0–0.7)
EOS PCT: 1 %
HCT: 43.5 % (ref 36.0–46.0)
HEMOGLOBIN: 14.7 g/dL (ref 12.0–15.0)
LYMPHS ABS: 1.4 10*3/uL (ref 0.7–4.0)
Lymphocytes Relative: 17 %
MCH: 32 pg (ref 26.0–34.0)
MCHC: 33.8 g/dL (ref 30.0–36.0)
MCV: 94.6 fL (ref 78.0–100.0)
MONOS PCT: 5 %
Monocytes Absolute: 0.4 10*3/uL (ref 0.1–1.0)
Neutro Abs: 6.2 10*3/uL (ref 1.7–7.7)
Neutrophils Relative %: 76 %
PLATELETS: 264 10*3/uL (ref 150–400)
RBC: 4.6 MIL/uL (ref 3.87–5.11)
RDW: 13.2 % (ref 11.5–15.5)
WBC: 8.1 10*3/uL (ref 4.0–10.5)

## 2016-05-28 LAB — URINALYSIS, ROUTINE W REFLEX MICROSCOPIC
BILIRUBIN URINE: NEGATIVE
GLUCOSE, UA: NEGATIVE mg/dL
HGB URINE DIPSTICK: NEGATIVE
KETONES UR: NEGATIVE mg/dL
LEUKOCYTES UA: NEGATIVE
Nitrite: NEGATIVE
PH: 6 (ref 5.0–8.0)
PROTEIN: NEGATIVE mg/dL
Specific Gravity, Urine: 1.004 — ABNORMAL LOW (ref 1.005–1.030)

## 2016-05-28 LAB — BASIC METABOLIC PANEL
Anion gap: 9 (ref 5–15)
CHLORIDE: 104 mmol/L (ref 101–111)
CO2: 26 mmol/L (ref 22–32)
CREATININE: 0.9 mg/dL (ref 0.44–1.00)
Calcium: 9.1 mg/dL (ref 8.9–10.3)
GFR calc Af Amer: >60 mL/min (ref >60)
GFR calc non Af Amer: >60 mL/min (ref >60)
Glucose, Bld: 101 mg/dL — ABNORMAL HIGH (ref 65–99)
Potassium: 2.7 mmol/L — CL (ref 3.5–5.1)
Sodium: 139 mmol/L (ref 135–145)

## 2016-05-28 LAB — PROTIME-INR
INR: 0.99 (ref 0.00–1.49)
PROTHROMBIN TIME: 13.3 s (ref 11.6–15.2)

## 2016-05-28 LAB — TYPE AND SCREEN
ABO/RH(D): A POS
Antibody Screen: NEGATIVE

## 2016-05-28 LAB — MAGNESIUM: Magnesium: 2.1 mg/dL (ref 1.7–2.4)

## 2016-05-28 LAB — ABO/RH: ABO/RH(D): A POS

## 2016-05-28 MED ORDER — MORPHINE SULFATE (PF) 2 MG/ML IV SOLN
0.5000 mg | INTRAVENOUS | Status: DC | PRN
  Administered 2016-05-28 (×2): 0.5 mg via INTRAVENOUS
  Filled 2016-05-28 (×2): qty 1

## 2016-05-28 MED ORDER — SODIUM CHLORIDE 0.9 % IV SOLN: INTRAVENOUS | Status: DC

## 2016-05-28 MED ORDER — HYDROCODONE-ACETAMINOPHEN 5-325 MG PO TABS
1.0000 | ORAL_TABLET | Freq: Four times a day (QID) | ORAL | Status: DC | PRN
  Administered 2016-05-28: 2 via ORAL
  Filled 2016-05-28: qty 2

## 2016-05-28 MED ORDER — MORPHINE SULFATE (PF) 4 MG/ML IV SOLN
4.0000 mg | INTRAVENOUS | Status: DC | PRN
  Administered 2016-05-28: 4 mg via INTRAVENOUS
  Filled 2016-05-28: qty 1

## 2016-05-28 MED ORDER — POTASSIUM CHLORIDE CRYS ER 20 MEQ PO TBCR
30.0000 meq | EXTENDED_RELEASE_TABLET | Freq: Four times a day (QID) | ORAL | Status: AC
  Administered 2016-05-28 (×2): 30 meq via ORAL
  Filled 2016-05-28: qty 2
  Filled 2016-05-28: qty 1

## 2016-05-28 MED ORDER — CEFAZOLIN SODIUM-DEXTROSE 2-4 GM/100ML-% IV SOLN
2.0000 g | INTRAVENOUS | Status: AC
  Administered 2016-05-28: 2 g via INTRAVENOUS
  Filled 2016-05-28: qty 100

## 2016-05-28 MED ORDER — POTASSIUM CHLORIDE 10 MEQ/100ML IV SOLN
10.0000 meq | INTRAVENOUS | Status: AC
  Administered 2016-05-28 (×2): 10 meq via INTRAVENOUS
  Filled 2016-05-28: qty 100

## 2016-05-28 MED ORDER — POTASSIUM CHLORIDE 10 MEQ/100ML IV SOLN
10.0000 meq | INTRAVENOUS | Status: AC
  Administered 2016-05-28 (×3): 10 meq via INTRAVENOUS
  Filled 2016-05-28 (×3): qty 100

## 2016-05-28 MED ORDER — DEXTROSE-NACL 5-0.45 % IV SOLN: 100.0000 mL/h | INTRAVENOUS | Status: DC

## 2016-05-28 MED ORDER — CHLORHEXIDINE GLUCONATE 4 % EX LIQD
60.0000 mL | Freq: Once | CUTANEOUS | Status: AC
  Administered 2016-05-28: 4 via TOPICAL
  Filled 2016-05-28: qty 60

## 2016-05-28 MED ORDER — SODIUM CHLORIDE 0.9 % IV BOLUS (SEPSIS)
1000.0000 mL | Freq: Once | INTRAVENOUS | Status: AC
  Administered 2016-05-28: 1000 mL via INTRAVENOUS

## 2016-05-28 MED ORDER — POVIDONE-IODINE 10 % EX SWAB: 2.0000 "application " | Freq: Once | CUTANEOUS | Status: DC

## 2016-05-28 MED ORDER — SENNOSIDES-DOCUSATE SODIUM 8.6-50 MG PO TABS: 1.0000 | ORAL_TABLET | Freq: Every evening | ORAL | Status: DC | PRN

## 2016-05-28 MED ORDER — HYDRALAZINE HCL 20 MG/ML IJ SOLN: 5.0000 mg | Freq: Three times a day (TID) | INTRAMUSCULAR | Status: DC | PRN

## 2016-05-28 MED ORDER — SODIUM CHLORIDE 0.9 % IV SOLN
INTRAVENOUS | Status: DC
  Administered 2016-05-28: 11:00:00 via INTRAVENOUS

## 2016-05-28 MED ORDER — HYDROCODONE-ACETAMINOPHEN 5-325 MG PO TABS
1.0000 | ORAL_TABLET | ORAL | Status: DC | PRN
  Administered 2016-05-28: 2 via ORAL
  Filled 2016-05-28: qty 2

## 2016-05-28 MED ORDER — ALBUTEROL SULFATE (2.5 MG/3ML) 0.083% IN NEBU: 2.5000 mg | INHALATION_SOLUTION | Freq: Four times a day (QID) | RESPIRATORY_TRACT | Status: DC | PRN

## 2016-05-28 MED ORDER — ASPIRIN EC 81 MG PO TBEC
81.0000 mg | DELAYED_RELEASE_TABLET | Freq: Every day | ORAL | Status: DC
  Administered 2016-05-28: 81 mg via ORAL
  Filled 2016-05-28: qty 1

## 2016-05-28 MED ORDER — BISACODYL 5 MG PO TBEC: 5.0000 mg | DELAYED_RELEASE_TABLET | Freq: Every day | ORAL | Status: DC | PRN

## 2016-05-28 MED ORDER — IPRATROPIUM-ALBUTEROL 0.5-2.5 (3) MG/3ML IN SOLN
3.0000 mL | Freq: Four times a day (QID) | RESPIRATORY_TRACT | Status: DC
  Filled 2016-05-28 (×2): qty 3

## 2016-05-28 MED ORDER — ACETAMINOPHEN 500 MG PO TABS
1000.0000 mg | ORAL_TABLET | Freq: Once | ORAL | Status: AC
  Administered 2016-05-28: 1000 mg via ORAL

## 2016-05-28 MED ORDER — FLEET ENEMA 7-19 GM/118ML RE ENEM
1.0000 | ENEMA | Freq: Once | RECTAL | Status: DC | PRN
Start: 2016-05-28 — End: 2016-05-31

## 2016-05-28 MED ORDER — POTASSIUM CHLORIDE 10 MEQ/100ML IV SOLN
10.0000 meq | INTRAVENOUS | Status: AC
  Administered 2016-05-28: 10 meq via INTRAVENOUS
  Filled 2016-05-28 (×2): qty 100

## 2016-05-28 NOTE — Anesthesia Preprocedure Evaluation (Addendum)
Anesthesia Evaluation  Patient identified by MRN, date of birth, ID band Patient awake    Reviewed: Allergy & Precautions, NPO status , Patient's Chart, lab work & pertinent test results  Airway Mallampati: II  TM Distance: >3 FB Neck ROM: Full    Dental no notable dental hx.    Pulmonary Current Smoker,    Pulmonary exam normal breath sounds clear to auscultation       Cardiovascular Normal cardiovascular exam Rhythm:Regular Rate:Normal     Neuro/Psych negative neurological ROS  negative psych ROS   GI/Hepatic negative GI ROS, Neg liver ROS,   Endo/Other  negative endocrine ROS  Renal/GU negative Renal ROS  negative genitourinary   Musculoskeletal negative musculoskeletal ROS (+)   Abdominal   Peds negative pediatric ROS (+)  Hematology negative hematology ROS (+)   Anesthesia Other Findings   Reproductive/Obstetrics negative OB ROS                            Anesthesia Physical Anesthesia Plan  ASA: II  Anesthesia Plan: General   Post-op Pain Management:    Induction: Intravenous  Airway Management Planned: Oral ETT  Additional Equipment:   Intra-op Plan:   Post-operative Plan: Extubation in OR  Informed Consent: I have reviewed the patients History and Physical, chart, labs and discussed the procedure including the risks, benefits and alternatives for the proposed anesthesia with the patient or authorized representative who has indicated his/her understanding and acceptance.   Dental advisory given  Plan Discussed with: CRNA and Surgeon  Anesthesia Plan Comments:         Anesthesia Quick Evaluation

## 2016-05-28 NOTE — Progress Notes (Signed)
PT Cancellation Note  Order received for Physical Therapy.  Noted pt with hip fracture and possible surgery tomorrow.  Please re-order PT after surgery. Thanks  [name], [name]  [phone] 05/28/2016

## 2016-05-28 NOTE — Consult Note (Signed)
ORTHOPAEDIC CONSULTATION  REQUESTING PHYSICIAN: Albertine Patricia, MD  Chief Complaint: left hip pain  HPI: [name] is a 60 y.o. female who complains of a mechanical fall today. She complains of left hip pain. She denies syncope.  Past Medical History  Diagnosis Date  . Skin cancer   . Coronary artery disease   . Ruptured cervical disc   . Endometriosis   . Tobacco abuse   . Hyperlipidemia   . Hypotension    Past Surgical History  Procedure Laterality Date  . Abdominal hysterectomy    . Cholecystectomy    . Cardiac catheterization    . Tonsillectomy     Social History   Social History  . Marital Status: Married    Spouse Name: N/A  . Number of Children: N/A  . Years of Education: N/A   Social History Main Topics  . Smoking status: Current Every Day Smoker -- 1.00 packs/day  . Smokeless tobacco: None  . Alcohol Use: None  . Drug Use: None  . Sexual Activity: Not Asked   Other Topics Concern  . None   Social History Narrative   History reviewed. No pertinent family history. Allergies  Allergen Reactions  . Cortisone     REACTION: Fluid retention and hives   Prior to Admission medications   Not on File   Dg Chest 1 View  05/28/2016  CLINICAL DATA:  Pain following fall EXAM: CHEST 1 VIEW COMPARISON:  September 23, 2006 FINDINGS: There is no edema or consolidation. The heart size and pulmonary vascularity are normal. No adenopathy. There is fatty prominence at the right cardiophrenic angle, stable. No pneumothorax. No bone lesions. IMPRESSION: No edema or consolidation. Electronically Signed   By: Lowella Grip III M.D.   On: 05/28/2016 08:10   Dg Hip Unilat With Pelvis 2-3 Views Left  05/28/2016  CLINICAL DATA:  Pain following fall EXAM: DG HIP (WITH OR WITHOUT PELVIS) 2-3V LEFT COMPARISON:  None. FINDINGS: Frontal pelvis as well as frontal and lateral left hip images were obtained. There is a subcapital femoral neck fracture on the left with  varus angulation and mild impaction at the fracture site. No other fractures are evident. No dislocation. There is slight symmetric narrowing of both hip joints. No erosive change. 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 There are foci of vascular calcification in the pelvis. IMPRESSION: Left-sided subcapital femoral neck fracture with varus angulation and mild impaction at the fracture site. No other fracture. No dislocation. Slight symmetric narrowing of both hip joints noted. Periarticular osteoporosis noted. Atherosclerosis noted in several pelvic arterial vessels. Electronically Signed   By: Lowella Grip III M.D.   On: 05/28/2016 08:08    Positive ROS: All other systems have been reviewed and were otherwise negative with the exception of those mentioned in the HPI and as above.  Labs cbc  Recent Labs  05/28/16 0746  WBC 8.1  HGB 14.7  HCT 43.5  PLT 264    Labs inflam No results for input(s): CRP in the last 72 hours.  Invalid input(s): ESR  Labs coag  Recent Labs  05/28/16 0746  INR 0.99     Recent Labs  05/28/16 0746  NA 139  K 2.7*  CL 104  CO2 26  GLUCOSE 101*  BUN <5*  CREATININE 0.90  CALCIUM 9.1    Physical Exam: Filed Vitals:   05/28/16 1357 05/28/16 1531  BP: 106/58 114/59  Pulse: 54 59  Temp:  98.8  F (37.1 C)  Resp: 18 16   General: Alert, no acute distress Cardiovascular: No pedal edema Respiratory: No cyanosis, no use of accessory musculature GI: No organomegaly, abdomen is soft and non-tender Skin: No lesions in the area of chief complaint other than those listed below in MSK exam.  Neurologic: Sensation intact distally save for the below mentioned MSK exam Psychiatric: Patient is competent for consent with normal mood and affect Lymphatic: No axillary or cervical lymphadenopathy  MUSCULOSKELETAL:  Left lower extremity compartments are soft some ecchymosis she is neurovascularly intact to her toes with good pulses Other  extremities are atraumatic with painless ROM and NVI.  Assessment: Left femoral neck fracture  Plan: I've discussed with Dr. Ninfa Linden he will perform a left hip hemiarthroplasty tomorrow morning risk stratification and clearance for surgery pending   Rebecca Butters, MD Cell [phone]   05/28/2016 3:43 PM

## 2016-05-28 NOTE — ED Notes (Signed)
EDP made aware of patient Potassium.

## 2016-05-28 NOTE — ED Provider Notes (Signed)
CSN: [id number]     Arrival date & time 05/28/16  [id number] History   First MD Initiated Contact with Patient 05/28/16 0700     No chief complaint on file.     HPI Patient presents to the emergency department after a slip and fall today while she was walking down steps.  She landed on her left hip and presents with left lower extremity shortening and external rotation.  She denies head injury.  Denies neck pain.  No chest pain shortness of breath.  Denies abdominal pain.  Can wiggle her toes on her left foot.    Past Medical History  Diagnosis Date  . Skin cancer   . Coronary artery disease   . Ruptured cervical disc   . Endometriosis    Past Surgical History  Procedure Laterality Date  . Abdominal hysterectomy     History reviewed. No pertinent family history. Social History  Substance Use Topics  . Smoking status: Current Every Day Smoker -- 1.00 packs/day  . Smokeless tobacco: None  . Alcohol Use: None   OB History    No data available     Review of Systems  All other systems reviewed and are negative.     Allergies  Cortisone  Home Medications   Prior to Admission medications   Not on File   BP 133/78 mmHg  Pulse 56  Temp(Src) 98.7 F (37.1 C) (Oral)  Resp 18  Ht 5\' 6"  (1.676 m)  Wt 140 lb (63.504 kg)  BMI 22.61 kg/m2  SpO2 96% Physical Exam  Constitutional: She is oriented to person, place, and time. She appears well-developed and well-nourished. No distress.  HENT:  Head: Normocephalic and atraumatic.  Eyes: EOM are normal.  Neck: Normal range of motion.  Cardiovascular: Normal rate, regular rhythm and normal heart sounds.   Pulmonary/Chest: Effort normal and breath sounds normal.  Abdominal: Soft. She exhibits no distension. There is no tenderness.  Musculoskeletal:  Pain with range of motion of the left hip.  Left leg shortening and external rotation.  Normal PT and DP pulse in the left foot.  Neurological: She is alert and oriented to person,  place, and time.  Skin: Skin is warm and dry.  Psychiatric: She has a normal mood and affect. Judgment normal.  Nursing note and vitals reviewed.   ED Course  Procedures (including critical care time) Labs Review Labs Reviewed  BASIC METABOLIC PANEL - Abnormal; Notable for the following:    Potassium 2.7 (*)    Glucose, Bld 101 (*)    BUN <5 (*)    All other components within normal limits  CBC WITH DIFFERENTIAL/PLATELET  PROTIME-INR  TYPE AND SCREEN  ABO/RH    Imaging Review Dg Chest 1 View  05/28/2016  CLINICAL DATA:  Pain following fall EXAM: CHEST 1 VIEW COMPARISON:  September 23, 2006 FINDINGS: There is no edema or consolidation. The heart size and pulmonary vascularity are normal. No adenopathy. There is fatty prominence at the right cardiophrenic angle, stable. No pneumothorax. No bone lesions. IMPRESSION: No edema or consolidation. Electronically Signed   By: Lowella Grip III M.D.   On: 05/28/2016 08:10   Dg Hip Unilat With Pelvis 2-3 Views Left  05/28/2016  CLINICAL DATA:  Pain following fall EXAM: DG HIP (WITH OR WITHOUT PELVIS) 2-3V LEFT COMPARISON:  None. FINDINGS: Frontal pelvis as well as frontal and lateral left hip images were obtained. There is a subcapital femoral neck fracture on the left with varus angulation and  mild impaction at the fracture site. No other fractures are evident. No dislocation. There is slight symmetric narrowing of both hip joints. No erosive change. 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 There are foci of vascular calcification in the pelvis. IMPRESSION: Left-sided subcapital femoral neck fracture with varus angulation and mild impaction at the fracture site. No other fracture. No dislocation. Slight symmetric narrowing of both hip joints noted. Periarticular osteoporosis noted. Atherosclerosis noted in several pelvic arterial vessels. Electronically Signed   By: Lowella Grip III M.D.   On: 05/28/2016 08:08   I have personally  reviewed and evaluated these images and lab results as part of my medical decision-making.   EKG Interpretation   Date/Time:  Friday May 28 2016 07:37:24 EDT Ventricular Rate:  59 PR Interval:    QRS Duration: 96 QT Interval:  442 QTC Calculation: 438 R Axis:   88 Text Interpretation:  Sinus rhythm Short PR interval Borderline right axis  deviation Minimal ST depression, diffuse leads No significant change was  found Confirmed by Henderson Frampton  MD, Miki Blank ([id number]) on 05/28/2016 9:41:15 AM      MDM   Final diagnoses:  None    Patient with subcapital left hip fracture.  Orthopedic consultation with Dr. Fredonia Highland.  Hospitalist admission.  Patient does not want any medication for pain right now.    Jola Schmidt, MD 05/28/16 [phone]

## 2016-05-28 NOTE — Progress Notes (Signed)
Pt admitted to the room from ED; pt A&O x4; pt IV intact and transfusing; pt oriented to the unit and room; ice applied to left hip; pt denies any numbness or tingling except pain to hip. Pt foley inserted per order; incentive spirometer given to pt and education completed with pt; MD paged and notified of pt arrival to the floor; pt resting comfortably in bed with call light within reach. Will continue to closely monitor. Delia Heady RN

## 2016-05-28 NOTE — ED Notes (Signed)
Pt presents with shortening and rotated L extremity after falling at 0525 this morning.  Pt reports missing 2 steps, landing on concrete on L hip.

## 2016-05-28 NOTE — ED Notes (Signed)
Pt transported to and from radiology on stretcher with tech, tolerated well.  Pt declines pain medication at this time, family sitting with pt.

## 2016-05-28 NOTE — Progress Notes (Signed)
OT Cancellation Note  Patient Details Name: [name] MRN: [id number] DOB: [birth year]   Cancelled Treatment:    Reason Eval/Treat Not Completed: Medical issues which prohibited therapy. Pt admitted with hip fx, to have surgical repair tomorow. Please reorder following surgery. Thank you.  [name] 05/28/2016, 3:36 PM

## 2016-05-28 NOTE — H&P (Signed)
History and Physical    [name] [id number] DOB: April 30, 1956 DOA: 05/28/2016   PCP: Weston Settle   Patient coming from:  Home  Chief Complaint: Left hip fracture   HPI: [name] is a 60 y.o. female with medical history significant for CAD, HLD, Tobacco abuse, not seen by PCP in many years, presenting to the emergency department after a full experienced today as she was walking down her steps.The patient states that it was very dark, and she did not see the last step which caused her to land on cementon her left hip, with subsequent decreas  in her range of motion and with pain with external rotation. She remained about 45 minutes on the floor and feels her custom found her and was able to call for help. Pain on admission was severe, now well controlled with meds. She denies any preceding symptoms such as dizziness, nausea, chest pain or palpitations. She denies any vision changes. The patient did not heat head or had any acute confusional issues prior to this fall . She denies any neck pain. She denies any shortness of breath, or cough. She denies any bowel or urine incontinence after a fall. No seizures or confusion noted. No history of stroke or TIA. She can move her left foot without difficulty and sensation remains intact.         ED Course:  BP 135/75 mmHg  Pulse 58  Temp(Src) 98.7 F (37.1 C) (Oral)  Resp 18  Ht 5\' 6"  (1.676 m)  Wt 63.504 kg (140 lb)  BMI 22.61 kg/m2  SpO2 96%   K 2.7  CBC normal  CXR without acute respiratory abnormalities Left hix  X ray  Left-sided subcapital femoral neck fracture with varus angulation and mild impaction at the fracture site. No other fracture. No dislocation. Slight symmetric narrowing of both hip joints noted. Periarticular osteoporosis noted.  Review of Systems: As per HPI otherwise 10 point review of systems negative.   Past Medical History  Diagnosis Date  . Skin cancer   . Coronary artery disease   . Ruptured cervical  disc   . Endometriosis   . Tobacco abuse   . Hyperlipidemia   . Hypotension     Past Surgical History  Procedure Laterality Date  . Abdominal hysterectomy    . Cholecystectomy    . Cardiac catheterization      Social History Social History   Social History  . Marital Status: Married    Spouse Name: N/A  . Number of Children: N/A  . Years of Education: N/A   Occupational History  . Not on file.   Social History Main Topics  . Smoking status: Current Every Day Smoker -- 1.00 packs/day  . Smokeless tobacco: Not on file  . Alcohol Use: Not on file  . Drug Use: Not on file  . Sexual Activity: Not on file   Other Topics Concern  . Not on file   Social History Narrative     Allergies  Allergen Reactions  . Cortisone     REACTION: Fluid retention and hives    History reviewed. No pertinent family history.    Prior to Admission medications   Not on File    Physical Exam:    Filed Vitals:   05/28/16 0710 05/28/16 0909 05/28/16 1008  BP: 166/77 133/78 135/75  Pulse: 59 56 58  Temp: 98.7 F (37.1 C)    TempSrc: Oral    Resp: 18 18 18   Height: 5'  6" (1.676 m)    Weight: 63.504 kg (140 lb)    SpO2: 97% 96% 96%       Constitutional: NAD, calm, comfortable   Filed Vitals:   05/28/16 0710 05/28/16 0909 05/28/16 1008  BP: 166/77 133/78 135/75  Pulse: 59 56 58  Temp: 98.7 F (37.1 C)    TempSrc: Oral    Resp: 18 18 18   Height: 5\' 6"  (1.676 m)    Weight: 63.504 kg (140 lb)    SpO2: 97% 96% 96%   Eyes: PERRL, lids and conjunctivae normal ENMT: Mucous membranes are moist. Posterior pharynx clear of any exudate or lesions.Several missing teeth Neck: normal, supple, no masses, no thyromegaly Respiratory: clear to auscultation bilaterally, no wheezing, no crackles. Mild atelectatic sounds  Normal respiratory effort. No accessory muscle use.  Cardiovascular: Regular rate and rhythm, no murmurs / rubs / gallops. No extremity edema. 2+ pedal pulses. No  carotid bruits.  Abdomen: no tenderness, no masses palpated. No hepatosplenomegaly. Bowel sounds positive.  Musculoskeletal: no clubbing / cyanosis. Decreased ROM L hip Normal muscle tone.  Skin: no rashes, lesions, ulcers. Multiple tattoos Neurologic: CN 2-12 grossly intact. Sensation intact, DTR normal. Strength 5/5 in all 4.  Psychiatric: Normal judgment and insight. Alert and oriented x 3. Normal mood.     Labs on Admission: I have personally reviewed following labs and imaging studies  CBC:  Recent Labs Lab 05/28/16 0746  WBC 8.1  NEUTROABS 6.2  HGB 14.7  HCT 43.5  MCV 94.6  PLT [id number]    Basic Metabolic Panel:  Recent Labs Lab 05/28/16 0746  NA 139  K 2.7*  CL 104  CO2 26  GLUCOSE 101*  BUN <5*  CREATININE 0.90  CALCIUM 9.1    GFR: Estimated Creatinine Clearance: 62.2 mL/min (by C-G formula based on Cr of 0.9).  Liver Function Tests: No results for input(s): AST, ALT, ALKPHOS, BILITOT, PROT, ALBUMIN in the last 168 hours. No results for input(s): LIPASE, AMYLASE in the last 168 hours. No results for input(s): AMMONIA in the last 168 hours.  Coagulation Profile:  Recent Labs Lab 05/28/16 0746  INR 0.99    Cardiac Enzymes: No results for input(s): CKTOTAL, CKMB, CKMBINDEX, TROPONINI in the last 168 hours.  BNP (last 3 results) No results for input(s): PROBNP in the last 8760 hours.  HbA1C: No results for input(s): HGBA1C in the last 72 hours.  CBG: No results for input(s): GLUCAP in the last 168 hours.  Lipid Profile: No results for input(s): CHOL, HDL, LDLCALC, TRIG, CHOLHDL, LDLDIRECT in the last 72 hours.  Thyroid Function Tests: No results for input(s): TSH, T4TOTAL, FREET4, T3FREE, THYROIDAB in the last 72 hours.  Anemia Panel: No results for input(s): VITAMINB12, FOLATE, FERRITIN, TIBC, IRON, RETICCTPCT in the last 72 hours.  Urine analysis: No results found for: COLORURINE, APPEARANCEUR, LABSPEC, PHURINE, GLUCOSEU, HGBUR,  BILIRUBINUR, KETONESUR, PROTEINUR, UROBILINOGEN, NITRITE, LEUKOCYTESUR  Sepsis Labs: @LABRCNTIP (procalcitonin:4,lacticidven:4) )No results found for this or any previous visit (from the past 240 hour(s)).   Radiological Exams on Admission: Dg Chest 1 View  05/28/2016  CLINICAL DATA:  Pain following fall EXAM: CHEST 1 VIEW COMPARISON:  September 23, 2006 FINDINGS: There is no edema or consolidation. The heart size and pulmonary vascularity are normal. No adenopathy. There is fatty prominence at the right cardiophrenic angle, stable. No pneumothorax. No bone lesions. IMPRESSION: No edema or consolidation. Electronically Signed   By: Lowella Grip III M.D.   On: 05/28/2016 08:10   Dg  Hip Unilat With Pelvis 2-3 Views Left  05/28/2016  CLINICAL DATA:  Pain following fall EXAM: DG HIP (WITH OR WITHOUT PELVIS) 2-3V LEFT COMPARISON:  None. FINDINGS: Frontal pelvis as well as frontal and lateral left hip images were obtained. There is a subcapital femoral neck fracture on the left with varus angulation and mild impaction at the fracture site. No other fractures are evident. No dislocation. There is slight symmetric narrowing of both hip joints. No erosive change. 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 There are foci of vascular calcification in the pelvis. IMPRESSION: Left-sided subcapital femoral neck fracture with varus angulation and mild impaction at the fracture site. No other fracture. No dislocation. Slight symmetric narrowing of both hip joints noted. Periarticular osteoporosis noted. Atherosclerosis noted in several pelvic arterial vessels. Electronically Signed   By: Lowella Grip III M.D.   On: 05/28/2016 08:08    EKG: Independently reviewed.  Assessment/Plan Active Problems:   TOBACCO ABUSE   Coronary atherosclerosis   BRONCHITIS, CHRONIC NOS   GERD   Hip fracture, left (HCC)   Hypertension   Hypokalemia   Closed left hip fracture (HCC)     Left Hip Fracture hip x ray  remarkable for Left-sided subcapital femoral neck fracture with varus angulation and mild impaction at the fracture site. No other fracture. No dislocation. Received  IV pain meds, immobilized.   Admit to med surg, anticipating surgery as per Ortho on 7/1 NPO after midnight SCDs IVF Pain control PT/OT consult  Tobacco abuse without  nicotine withdrawal -Counseled cessation Duonebs q 6 h Incentive Spirometry   CAD,  EKG without ACS, Had a cardiac catheterization with stent in [place]  In 2007 many years ago for non obstructive 3 vessel disease, patient is cardiac pain free at this time. Place patient on ASA today   Hypertension BP 135/75 mmHg  Pulse 58 Was not on antihypertensives prior to presentation  Add Hydralazine Q6 hours as needed for BP 160/90  May need to start on regular BB prior to discharge    Hypokalemia, unknown etiology. Not on diuretics. No urine or bowel incontinence  EKG SR no ACS . Current K 2.7    Received 10  meq IV x 2 at the ED Will add 30 meq po x 2 doses and 10 meq IV x 4 Will recheck  Check Mg Repeat CMET in am  Hyperlipidemia Check lipid panel    DVT prophylaxis:  SCD's   Code Status:   Full    Family Communication:  Discussed withhusband  Disposition Plan: Expect patient to be discharged to home after condition improves Consults called:   Ortho Admission status:  Inpatient  Medsurg   [facility] E, PA-C [facility]   If 7PM-7AM, please contact night-coverage [email or web address] Password [email or web address]  05/28/2016, 10:59 AM

## 2016-05-29 ENCOUNTER — Inpatient Hospital Stay (HOSPITAL_COMMUNITY): Payer: Managed Care, Other (non HMO)

## 2016-05-29 ENCOUNTER — Encounter (HOSPITAL_COMMUNITY): Payer: Self-pay | Admitting: Certified Registered Nurse Anesthetist

## 2016-05-29 ENCOUNTER — Inpatient Hospital Stay (HOSPITAL_COMMUNITY): Payer: Managed Care, Other (non HMO) | Admitting: Anesthesiology

## 2016-05-29 ENCOUNTER — Encounter (HOSPITAL_COMMUNITY)
Admission: EM | Disposition: A | Discharge status: Home / Self Care | Payer: Self-pay | Source: Home / Self Care | Attending: Internal Medicine

## 2016-05-29 DIAGNOSIS — E876 Hypokalemia: Secondary | ICD-10-CM

## 2016-05-29 DIAGNOSIS — I10 Essential (primary) hypertension: Secondary | ICD-10-CM

## 2016-05-29 DIAGNOSIS — I2510 Atherosclerotic heart disease of native coronary artery without angina pectoris: Secondary | ICD-10-CM

## 2016-05-29 DIAGNOSIS — S72002D Fracture of unspecified part of neck of left femur, subsequent encounter for closed fracture with routine healing: Secondary | ICD-10-CM

## 2016-05-29 HISTORY — PX: ANTERIOR APPROACH HEMI HIP ARTHROPLASTY: SHX6690

## 2016-05-29 LAB — LIPID PANEL
CHOL/HDL RATIO: 4.1 ratio
Cholesterol: 164 mg/dL (ref 0–200)
HDL: 40 mg/dL — ABNORMAL LOW (ref >40)
LDL Cholesterol: 105 mg/dL — ABNORMAL HIGH (ref 0–99)
Triglycerides: 95 mg/dL (ref <150)
VLDL: 19 mg/dL (ref 0–40)

## 2016-05-29 LAB — BASIC METABOLIC PANEL
Anion gap: 3 — ABNORMAL LOW (ref 5–15)
CO2: 22 mmol/L (ref 22–32)
CREATININE: 0.73 mg/dL (ref 0.44–1.00)
Calcium: 8.1 mg/dL — ABNORMAL LOW (ref 8.9–10.3)
Chloride: 113 mmol/L — ABNORMAL HIGH (ref 101–111)
GFR calc Af Amer: >60 mL/min (ref >60)
GLUCOSE: 104 mg/dL — AB (ref 65–99)
POTASSIUM: 4 mmol/L (ref 3.5–5.1)
SODIUM: 138 mmol/L (ref 135–145)

## 2016-05-29 LAB — MRSA PCR SCREENING: MRSA by PCR: NEGATIVE

## 2016-05-29 LAB — CBC
HCT: 36.6 % (ref 36.0–46.0)
Hemoglobin: 11.6 g/dL — ABNORMAL LOW (ref 12.0–15.0)
MCH: 30.5 pg (ref 26.0–34.0)
MCHC: 31.7 g/dL (ref 30.0–36.0)
MCV: 96.3 fL (ref 78.0–100.0)
PLATELETS: 212 10*3/uL (ref 150–400)
RBC: 3.8 MIL/uL — AB (ref 3.87–5.11)
RDW: 13.4 % (ref 11.5–15.5)
WBC: 11.5 10*3/uL — ABNORMAL HIGH (ref 4.0–10.5)

## 2016-05-29 SURGERY — HEMIARTHROPLASTY, HIP, DIRECT ANTERIOR APPROACH, FOR FRACTURE
Anesthesia: General | Site: Hip | Laterality: Left

## 2016-05-29 MED ORDER — HYDROCODONE-ACETAMINOPHEN 5-325 MG PO TABS
1.0000 | ORAL_TABLET | Freq: Four times a day (QID) | ORAL | Status: DC | PRN
  Administered 2016-05-29: 2 via ORAL
  Filled 2016-05-29: qty 2

## 2016-05-29 MED ORDER — PROMETHAZINE HCL 25 MG/ML IJ SOLN: 6.2500 mg | INTRAMUSCULAR | Status: DC | PRN

## 2016-05-29 MED ORDER — CEFAZOLIN SODIUM-DEXTROSE 2-4 GM/100ML-% IV SOLN
2.0000 g | Freq: Four times a day (QID) | INTRAVENOUS | Status: AC
  Administered 2016-05-29 (×2): 2 g via INTRAVENOUS
  Filled 2016-05-29 (×2): qty 100

## 2016-05-29 MED ORDER — FENTANYL CITRATE (PF) 100 MCG/2ML IJ SOLN
INTRAMUSCULAR | Status: DC | PRN
  Administered 2016-05-29: 50 ug via INTRAVENOUS
  Administered 2016-05-29 (×2): 100 ug via INTRAVENOUS

## 2016-05-29 MED ORDER — PROPOFOL 10 MG/ML IV BOLUS
INTRAVENOUS | Status: DC | PRN
  Administered 2016-05-29: 80 mg via INTRAVENOUS

## 2016-05-29 MED ORDER — SUGAMMADEX SODIUM 200 MG/2ML IV SOLN
INTRAVENOUS | Status: DC | PRN
  Administered 2016-05-29: 200 mg via INTRAVENOUS

## 2016-05-29 MED ORDER — HYDROMORPHONE HCL 1 MG/ML IJ SOLN
INTRAMUSCULAR | Status: AC
  Administered 2016-05-29: 0.5 mg via INTRAVENOUS
  Filled 2016-05-29: qty 1

## 2016-05-29 MED ORDER — CEFAZOLIN SODIUM-DEXTROSE 2-4 GM/100ML-% IV SOLN
2.0000 g | Freq: Once | INTRAVENOUS | Status: AC
  Administered 2016-05-29: 2 g via INTRAVENOUS
  Filled 2016-05-29 (×2): qty 100

## 2016-05-29 MED ORDER — SODIUM CHLORIDE 0.9 % IV SOLN
INTRAVENOUS | Status: DC
  Administered 2016-05-29: 11:00:00 via INTRAVENOUS

## 2016-05-29 MED ORDER — EPHEDRINE 5 MG/ML INJ
INTRAVENOUS | Status: AC
  Filled 2016-05-29: qty 10

## 2016-05-29 MED ORDER — DOCUSATE SODIUM 100 MG PO CAPS
100.0000 mg | ORAL_CAPSULE | Freq: Two times a day (BID) | ORAL | Status: DC
  Administered 2016-05-29 – 2016-05-31 (×5): 100 mg via ORAL
  Filled 2016-05-29 (×5): qty 1

## 2016-05-29 MED ORDER — GLYCOPYRROLATE 0.2 MG/ML IV SOSY
PREFILLED_SYRINGE | INTRAVENOUS | Status: AC
  Filled 2016-05-29: qty 3

## 2016-05-29 MED ORDER — METHOCARBAMOL 1000 MG/10ML IJ SOLN
500.0000 mg | Freq: Four times a day (QID) | INTRAVENOUS | Status: DC | PRN
  Filled 2016-05-29: qty 5

## 2016-05-29 MED ORDER — MIDAZOLAM HCL 5 MG/5ML IJ SOLN
INTRAMUSCULAR | Status: DC | PRN
  Administered 2016-05-29: 2 mg via INTRAVENOUS

## 2016-05-29 MED ORDER — METOCLOPRAMIDE HCL 5 MG/ML IJ SOLN: 5.0000 mg | Freq: Three times a day (TID) | INTRAMUSCULAR | Status: DC | PRN

## 2016-05-29 MED ORDER — HYDROMORPHONE HCL 1 MG/ML IJ SOLN
0.2500 mg | INTRAMUSCULAR | Status: DC | PRN
  Administered 2016-05-29 (×4): 0.5 mg via INTRAVENOUS

## 2016-05-29 MED ORDER — ARTIFICIAL TEARS OP OINT
TOPICAL_OINTMENT | OPHTHALMIC | Status: DC | PRN
  Administered 2016-05-29: 1 via OPHTHALMIC

## 2016-05-29 MED ORDER — SUGAMMADEX SODIUM 500 MG/5ML IV SOLN
INTRAVENOUS | Status: AC
  Filled 2016-05-29: qty 5

## 2016-05-29 MED ORDER — 0.9 % SODIUM CHLORIDE (POUR BTL) OPTIME
TOPICAL | Status: DC | PRN
Start: 2016-05-29 — End: 2016-05-29
  Administered 2016-05-29: 1000 mL

## 2016-05-29 MED ORDER — ACETAMINOPHEN 650 MG RE SUPP: 650.0000 mg | Freq: Four times a day (QID) | RECTAL | Status: DC | PRN

## 2016-05-29 MED ORDER — ROCURONIUM BROMIDE 100 MG/10ML IV SOLN
INTRAVENOUS | Status: DC | PRN
  Administered 2016-05-29: 40 mg via INTRAVENOUS

## 2016-05-29 MED ORDER — PHENOL 1.4 % MT LIQD: 1.0000 | OROMUCOSAL | Status: DC | PRN

## 2016-05-29 MED ORDER — MENTHOL 3 MG MT LOZG: 1.0000 | LOZENGE | OROMUCOSAL | Status: DC | PRN

## 2016-05-29 MED ORDER — METHOCARBAMOL 500 MG PO TABS: 500.0000 mg | ORAL_TABLET | Freq: Four times a day (QID) | ORAL | Status: DC | PRN

## 2016-05-29 MED ORDER — ONDANSETRON HCL 4 MG/2ML IJ SOLN
INTRAMUSCULAR | Status: AC
  Filled 2016-05-29: qty 2

## 2016-05-29 MED ORDER — METOCLOPRAMIDE HCL 5 MG PO TABS: 5.0000 mg | ORAL_TABLET | Freq: Three times a day (TID) | ORAL | Status: DC | PRN

## 2016-05-29 MED ORDER — ONDANSETRON HCL 4 MG/2ML IJ SOLN
INTRAMUSCULAR | Status: DC | PRN
  Administered 2016-05-29: 4 mg via INTRAVENOUS

## 2016-05-29 MED ORDER — EPHEDRINE SULFATE 50 MG/ML IJ SOLN
INTRAMUSCULAR | Status: DC | PRN
  Administered 2016-05-29 (×3): 5 mg via INTRAVENOUS

## 2016-05-29 MED ORDER — ASPIRIN EC 325 MG PO TBEC
325.0000 mg | DELAYED_RELEASE_TABLET | Freq: Two times a day (BID) | ORAL | Status: DC
  Administered 2016-05-30 – 2016-05-31 (×3): 325 mg via ORAL
  Filled 2016-05-29 (×3): qty 1

## 2016-05-29 MED ORDER — LIDOCAINE 2% (20 MG/ML) 5 ML SYRINGE
INTRAMUSCULAR | Status: AC
  Filled 2016-05-29: qty 5

## 2016-05-29 MED ORDER — HYDROMORPHONE HCL 1 MG/ML IJ SOLN: 0.1000 mg | INTRAMUSCULAR | Status: DC | PRN

## 2016-05-29 MED ORDER — FENTANYL CITRATE (PF) 250 MCG/5ML IJ SOLN
INTRAMUSCULAR | Status: AC
  Filled 2016-05-29: qty 5

## 2016-05-29 MED ORDER — OXYCODONE HCL 5 MG PO TABS
5.0000 mg | ORAL_TABLET | ORAL | Status: DC | PRN
  Administered 2016-05-30: 10 mg via ORAL
  Filled 2016-05-29: qty 2

## 2016-05-29 MED ORDER — ACETAMINOPHEN 325 MG PO TABS
650.0000 mg | ORAL_TABLET | Freq: Four times a day (QID) | ORAL | Status: DC | PRN
  Administered 2016-05-30: 650 mg via ORAL
  Filled 2016-05-29: qty 2

## 2016-05-29 MED ORDER — PROPOFOL 10 MG/ML IV BOLUS
INTRAVENOUS | Status: AC
  Filled 2016-05-29: qty 20

## 2016-05-29 MED ORDER — MORPHINE SULFATE (PF) 2 MG/ML IV SOLN: 0.5000 mg | INTRAVENOUS | Status: DC | PRN

## 2016-05-29 MED ORDER — PNEUMOCOCCAL VAC POLYVALENT 25 MCG/0.5ML IJ INJ: 0.5000 mL | INJECTION | INTRAMUSCULAR | Status: DC | PRN

## 2016-05-29 MED ORDER — ONDANSETRON HCL 4 MG PO TABS: 4.0000 mg | ORAL_TABLET | Freq: Four times a day (QID) | ORAL | Status: DC | PRN

## 2016-05-29 MED ORDER — ONDANSETRON HCL 4 MG/2ML IJ SOLN
4.0000 mg | Freq: Four times a day (QID) | INTRAMUSCULAR | Status: DC | PRN
  Administered 2016-05-29: 4 mg via INTRAVENOUS
  Filled 2016-05-29: qty 2

## 2016-05-29 MED ORDER — LACTATED RINGERS IV SOLN
INTRAVENOUS | Status: DC | PRN
  Administered 2016-05-29 (×2): via INTRAVENOUS

## 2016-05-29 MED ORDER — MIDAZOLAM HCL 2 MG/2ML IJ SOLN
INTRAMUSCULAR | Status: AC
  Filled 2016-05-29: qty 2

## 2016-05-29 MED ORDER — GLYCOPYRROLATE 0.2 MG/ML IJ SOLN
INTRAMUSCULAR | Status: DC | PRN
  Administered 2016-05-29: 0.2 mg via INTRAVENOUS

## 2016-05-29 MED ORDER — SODIUM CHLORIDE 0.9 % IR SOLN
Status: DC | PRN
Start: 2016-05-29 — End: 2016-05-29
  Administered 2016-05-29: 1000 mL

## 2016-05-29 MED ORDER — LIDOCAINE HCL (CARDIAC) 20 MG/ML IV SOLN
INTRAVENOUS | Status: DC | PRN
Start: 2016-05-29 — End: 2016-05-29
  Administered 2016-05-29: 60 mg via INTRAVENOUS

## 2016-05-29 SURGICAL SUPPLY — 47 items
BLADE SAW SGTL 18X1.27X75 (BLADE) ×2 IMPLANT
BLADE SAW SGTL 18X1.27X75MM (BLADE) ×1
CAPT HIP TOTAL 2 ×2 IMPLANT
CELLS DAT CNTRL 66122 CELL SVR (MISCELLANEOUS) ×1 IMPLANT
CLOSURE STERI-STRIP 1/2X4 (GAUZE/BANDAGES/DRESSINGS) ×1
CLSR STERI-STRIP ANTIMIC 1/2X4 (GAUZE/BANDAGES/DRESSINGS) ×1 IMPLANT
COVER SURGICAL LIGHT HANDLE (MISCELLANEOUS) ×3 IMPLANT
DRAPE C-ARM 42X72 X-RAY (DRAPES) ×3 IMPLANT
DRAPE STERI IOBAN 125X83 (DRAPES) ×3 IMPLANT
DRAPE U-SHAPE 47X51 STRL (DRAPES) ×9 IMPLANT
DRSG AQUACEL AG ADV 3.5X10 (GAUZE/BANDAGES/DRESSINGS) ×3 IMPLANT
DURAPREP 26ML APPLICATOR (WOUND CARE) ×3 IMPLANT
ELECT BLADE 4.0 EZ CLEAN MEGAD (MISCELLANEOUS) ×3
ELECT BLADE 6.5 EXT (BLADE) ×2 IMPLANT
ELECT REM PT RETURN 9FT ADLT (ELECTROSURGICAL) ×3
ELECTRODE BLDE 4.0 EZ CLN MEGD (MISCELLANEOUS) ×1 IMPLANT
ELECTRODE REM PT RTRN 9FT ADLT (ELECTROSURGICAL) ×1 IMPLANT
FACESHIELD WRAPAROUND (MASK) ×3 IMPLANT
FACESHIELD WRAPAROUND OR TEAM (MASK) ×2 IMPLANT
GLOVE BIOGEL PI IND STRL 8 (GLOVE) ×2 IMPLANT
GLOVE BIOGEL PI INDICATOR 8 (GLOVE) ×2
GLOVE ECLIPSE 8.0 STRL XLNG CF (GLOVE) ×1 IMPLANT
GLOVE ORTHO TXT STRL SZ7.5 (GLOVE) ×4 IMPLANT
GOWN STRL REUS W/ TWL LRG LVL3 (GOWN DISPOSABLE) ×2 IMPLANT
GOWN STRL REUS W/ TWL XL LVL3 (GOWN DISPOSABLE) ×2 IMPLANT
GOWN STRL REUS W/TWL LRG LVL3 (GOWN DISPOSABLE) ×6
GOWN STRL REUS W/TWL XL LVL3 (GOWN DISPOSABLE) ×6
HANDPIECE INTERPULSE COAX TIP (DISPOSABLE) ×3
KIT BASIN OR (CUSTOM PROCEDURE TRAY) ×3 IMPLANT
KIT ROOM TURNOVER OR (KITS) ×3 IMPLANT
MANIFOLD NEPTUNE II (INSTRUMENTS) ×3 IMPLANT
NS IRRIG 1000ML POUR BTL (IV SOLUTION) ×3 IMPLANT
PACK TOTAL JOINT (CUSTOM PROCEDURE TRAY) ×3 IMPLANT
PAD ARMBOARD 7.5X6 YLW CONV (MISCELLANEOUS) ×3 IMPLANT
RETRACTOR WND ALEXIS 18 MED (MISCELLANEOUS) ×1 IMPLANT
RTRCTR WOUND ALEXIS 18CM MED (MISCELLANEOUS) ×3
SET HNDPC FAN SPRY TIP SCT (DISPOSABLE) ×1 IMPLANT
SUT ETHIBOND NAB CT1 #1 30IN (SUTURE) ×3 IMPLANT
SUT MNCRL AB 4-0 PS2 18 (SUTURE) IMPLANT
SUT VIC AB 0 CT1 27 (SUTURE) ×3
SUT VIC AB 0 CT1 27XBRD ANBCTR (SUTURE) ×1 IMPLANT
SUT VIC AB 1 CT1 27 (SUTURE) ×3
SUT VIC AB 1 CT1 27XBRD ANBCTR (SUTURE) ×1 IMPLANT
SUT VIC AB 2-0 CT1 27 (SUTURE) ×3
SUT VIC AB 2-0 CT1 TAPERPNT 27 (SUTURE) ×1 IMPLANT
TOWEL OR 17X24 6PK STRL BLUE (TOWEL DISPOSABLE) ×3 IMPLANT
TOWEL OR 17X26 10 PK STRL BLUE (TOWEL DISPOSABLE) ×3 IMPLANT

## 2016-05-29 NOTE — Anesthesia Procedure Notes (Signed)
Procedure Name: Intubation Date/Time: 05/29/2016 7:47 AM Performed by: Rogers Blocker Pre-anesthesia Checklist: Patient identified, Emergency Drugs available, Suction available, Patient being monitored and Timeout performed Patient Re-evaluated:Patient Re-evaluated prior to inductionOxygen Delivery Method: Circle system utilized Preoxygenation: Pre-oxygenation with 100% oxygen Intubation Type: IV induction Ventilation: Mask ventilation without difficulty Laryngoscope Size: Miller and 2 Grade View: Grade I Tube type: Oral Tube size: 7.5 mm Number of attempts: 1 Airway Equipment and Method: Stylet Placement Confirmation: ETT inserted through vocal cords under direct vision,  positive ETCO2,  CO2 detector and breath sounds checked- equal and bilateral Secured at: 22 cm Tube secured with: Tape Dental Injury: Teeth and Oropharynx as per pre-operative assessment

## 2016-05-29 NOTE — Progress Notes (Signed)
PROGRESS NOTE  [name] [id number] DOB: [birth year] DOA: 05/28/2016 PCP: Weston Settle   LOS: 1 day   Brief Narrative: 60 year old female with history of CAD, tobacco abuse, presents with mechanical fall, resulting in left hip fracture  Assessment & Plan: Principal Problem:   Closed left hip fracture (HCC) Active Problems:   TOBACCO ABUSE   Coronary atherosclerosis   BRONCHITIS, CHRONIC NOS   GERD   Hip fracture, left (HCC)   Hypertension   Hypokalemia   Left Hip Fracture  - hip x ray remarkable for Left-sided subcapital femoral neck fracture with varus angulation and mild impaction at the fracture site. - orthopedic surgery consulted, patient underwent anterior approach left THA - PT tomorrow, may need SNF  Tobacco abuse - Counseled cessation - Duonebs q 6 h - Incentive Spirometry   CAD - had a cardiac catheterization with stent in [place] 2007 - no chest pain   Hypertension - Was not on antihypertensives prior to presentation  - BP soft today, hold adding medications  Hypokalemia,  - resolved after repletion  Hyperlipidemia - Check lipid panel    DVT prophylaxis: per ortho post op Code Status: Full Family Communication: d/w husband bedside Disposition Plan: SNF 3 days  Consultants:   Orthopedic surgery   Procedures:  left THA 7/1   Antimicrobials:  Peri op Ancef   Subjective: - seen post op, sleepy, without complaints   Objective: Filed Vitals:   05/29/16 1008 05/29/16 1023 05/29/16 1038 05/29/16 1100  BP: 117/62 91/42 93/52  91/42  Pulse: 52 52 49 64  Temp:  97.6 F (36.4 C)  98.1 F (36.7 C)  TempSrc:    Oral  Resp: 11 9 13 16   Height:      Weight:      SpO2: 97% 96% 96% 100%    Intake/Output Summary (Last 24 hours) at 05/29/16 1240 Last data filed at 05/29/16 1000  Gross per 24 hour  Intake   1200 ml  Output   1850 ml  Net   -650 ml   Filed Weights   05/28/16 0710  Weight: 63.504 kg (140 lb)     Examination: Constitutional: NAD Filed Vitals:   05/29/16 1008 05/29/16 1023 05/29/16 1038 05/29/16 1100  BP: 117/62 91/42 93/52  91/42  Pulse: 52 52 49 64  Temp:  97.6 F (36.4 C)  98.1 F (36.7 C)  TempSrc:    Oral  Resp: 11 9 13 16   Height:      Weight:      SpO2: 97% 96% 96% 100%   Eyes: PERRL ENMT: Mucous membranes are moist. Respiratory: clear to auscultation bilaterally, no wheezing, no crackles.  Cardiovascular: Regular rate and rhythm, no murmurs / rubs / gallops.  Abdomen: no tenderness. Bowel sounds positive Neurologic: non focal Psychiatric: Normal judgment and insight. Alert and oriented x 3. Normal mood.    Data Reviewed: I have personally reviewed following labs and imaging studies  CBC:  Recent Labs Lab 05/28/16 0746 05/29/16 1112  WBC 8.1 11.5*  NEUTROABS 6.2  --   HGB 14.7 11.6*  HCT 43.5 36.6  MCV 94.6 96.3  PLT 264 [id number]   Basic Metabolic Panel:  Recent Labs Lab 05/28/16 0746 05/28/16 1104 05/29/16 1112  NA 139  --  138  K 2.7*  --  4.0  CL 104  --  113*  CO2 26  --  22  GLUCOSE 101*  --  104*  BUN <5*  --  <5*  CREATININE 0.90  --  0.73  CALCIUM 9.1  --  8.1*  MG  --  2.1  --    Coagulation Profile:  Recent Labs Lab 05/28/16 0746  INR 0.99   Lipid Profile:  Recent Labs  05/29/16 1112  CHOL 164  HDL 40*  LDLCALC 105*  TRIG 95  CHOLHDL 4.1   Urine analysis:    Component Value Date/Time   COLORURINE YELLOW 05/28/2016 [employer] 05/28/2016 1647   LABSPEC 1.004* 05/28/2016 1647   PHURINE 6.0 05/28/2016 1647   GLUCOSEU NEGATIVE 05/28/2016 1647   HGBUR NEGATIVE 05/28/2016 1647   BILIRUBINUR NEGATIVE 05/28/2016 1647   KETONESUR NEGATIVE 05/28/2016 1647   PROTEINUR NEGATIVE 05/28/2016 1647   NITRITE NEGATIVE 05/28/2016 1647   LEUKOCYTESUR NEGATIVE 05/28/2016 1647   Sepsis Labs: Invalid input(s): PROCALCITONIN, LACTICIDVEN  Recent Results (from the past 240 hour(s))  MRSA PCR Screening      Status: None   Collection Time: 05/28/16 11:50 PM  Result Value Ref Range Status   MRSA by PCR NEGATIVE NEGATIVE Final    Comment:        The GeneXpert MRSA Assay (FDA approved for NASAL specimens only), is one component of a comprehensive MRSA colonization surveillance program. It is not intended to diagnose MRSA infection nor to guide or monitor treatment for MRSA infections.       Radiology Studies: Dg Chest 1 View  05/28/2016  CLINICAL DATA:  Pain following fall EXAM: CHEST 1 VIEW COMPARISON:  September 23, 2006 FINDINGS: There is no edema or consolidation. The heart size and pulmonary vascularity are normal. No adenopathy. There is fatty prominence at the right cardiophrenic angle, stable. No pneumothorax. No bone lesions. IMPRESSION: No edema or consolidation. Electronically Signed   By: Lowella Grip III M.D.   On: 05/28/2016 08:10   Pelvis Portable  05/29/2016  CLINICAL DATA:  Status post left hip replacement EXAM: PORTABLE PELVIS 1-2 VIEWS COMPARISON:  None. FINDINGS: Left hip prosthesis is noted. Air is noted in the surgical bed. No acute bony abnormality is seen. IMPRESSION: Status post left hip replacement Electronically Signed   By: Inez Catalina M.D.   On: 05/29/2016 10:02   Dg Hip Operative Unilat W Or W/o Pelvis Left  05/29/2016  CLINICAL DATA:  Left total hip arthroplasty EXAM: OPERATIVE LEFT HIP (WITH PELVIS IF PERFORMED) 4 VIEWS TECHNIQUE: Fluoroscopic spot image(s) were submitted for interpretation post-operatively. COMPARISON:  05/28/2016 left hip radiographs FINDINGS: Fluoroscopy time 36 seconds. Four spot fluoroscopic nondiagnostic left hip radiographs demonstrate postsurgical changes from left total hip arthroplasty. IMPRESSION: Intraoperative fluoroscopic guidance for left total hip arthroplasty. Electronically Signed   By: Ilona Sorrel M.D.   On: 05/29/2016 09:35   Dg Hip Unilat With Pelvis 2-3 Views Left  05/28/2016  CLINICAL DATA:  Pain following fall EXAM: DG  HIP (WITH OR WITHOUT PELVIS) 2-3V LEFT COMPARISON:  None. FINDINGS: Frontal pelvis as well as frontal and lateral left hip images were obtained. There is a subcapital femoral neck fracture on the left with varus angulation and mild impaction at the fracture site. No other fractures are evident. No dislocation. There is slight symmetric narrowing of both hip joints. No erosive change. 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 There are foci of vascular calcification in the pelvis. IMPRESSION: Left-sided subcapital femoral neck fracture with varus angulation and mild impaction at the fracture site. No other fracture. No dislocation. Slight symmetric narrowing of both hip joints noted. Periarticular osteoporosis noted. Atherosclerosis noted in several pelvic arterial vessels. Electronically  Signed   By: Lowella Grip III M.D.   On: 05/28/2016 08:08     Scheduled Meds: . aspirin EC  325 mg Oral BID PC  .  ceFAZolin (ANCEF) IV  2 g Intravenous Q6H  . docusate sodium  100 mg Oral BID   Continuous Infusions: . sodium chloride 75 mL/hr at 05/28/16 1051  . sodium chloride      Marzetta Board, MD, PhD [facility] Pager [phone]  If 7PM-7AM, please contact night-coverage [email or web address] Password [email or web address] 05/29/2016, 12:40 PM

## 2016-05-29 NOTE — Transfer of Care (Signed)
Immediate Anesthesia Transfer of Care Note  Patient: [name]  Procedure(s) Performed: Procedure(s): ANTERIOR APPROACH TOTAL HIP ARTHROPLASTY  (Left)  Patient Location: PACU  Anesthesia Type:General  Level of Consciousness: awake, alert , oriented and patient cooperative  Airway & Oxygen Therapy: Patient Spontanous Breathing and Patient connected to face mask oxygen  Post-op Assessment: Report given to RN, Post -op Vital signs reviewed and stable and Patient moving all extremities X 4  Post vital signs: Reviewed and stable  Last Vitals:  Filed Vitals:   05/28/16 2054 05/29/16 0445  BP: 118/59 129/66  Pulse: 59 51  Temp: 37.4 C 36.8 C  Resp: 16 16    Last Pain:  Filed Vitals:   05/29/16 0652  PainSc: Asleep      Patients Stated Pain Goal: 0 [phone] [phone])  Complications: No apparent anesthesia complications

## 2016-05-29 NOTE — Anesthesia Postprocedure Evaluation (Signed)
Anesthesia Post Note  Patient: [name]  Procedure(s) Performed: Procedure(s) (LRB): ANTERIOR APPROACH TOTAL HIP ARTHROPLASTY  (Left)  Patient location during evaluation: PACU Anesthesia Type: General Level of consciousness: awake and alert Pain management: pain level controlled Vital Signs Assessment: post-procedure vital signs reviewed and stable Respiratory status: spontaneous breathing, nonlabored ventilation, respiratory function stable and patient connected to nasal cannula oxygen Cardiovascular status: blood pressure returned to baseline and stable Postop Assessment: no signs of nausea or vomiting Anesthetic complications: no    Last Vitals:  Filed Vitals:   05/29/16 0921 05/29/16 0938  BP: 122/62 130/66  Pulse: 56 59  Temp: 36.6 C   Resp: 13 15    Last Pain:  Filed Vitals:   05/29/16 0949  PainSc: 8                  Londan Coplen S

## 2016-05-29 NOTE — Brief Op Note (Signed)
05/28/2016 - 05/29/2016  9:08 AM  PATIENT:  Sylvester Harder  60 y.o. female  PRE-OPERATIVE DIAGNOSIS:  Femoral neck fracture  POST-OPERATIVE DIAGNOSIS:  Femoral neck fracture  PROCEDURE:  Procedure(s): ANTERIOR APPROACH TOTAL HIP ARTHROPLASTY  (Left)  SURGEON:  Surgeon(s) and Role:    * Naiping Ephriam Jenkins, MD - Assisting    * Mcarthur Rossetti, MD - Primary  ANESTHESIA:   general  EBL:  Total I/O In: 1000 [I.V.:1000] Out: 550 [Urine:450; Blood:100]  COUNTS:  YES  DICTATION: .Other Dictation: Dictation Number [id number]  PLAN OF CARE: Admit to inpatient   PATIENT DISPOSITION:  PACU - hemodynamically stable.   Delay start of Pharmacological VTE agent (>24hrs) due to surgical blood loss or risk of bleeding: no

## 2016-05-29 NOTE — Progress Notes (Signed)
Patient ID: [name], female   DOB: September 11, 1956, 60 y.o.   MRN: [id number] I have seen [name] and discussed in detail the plan.  We will proceed to surgery today for a left anterior total hip replacement instead of a hemiarthroplasty given the fact that she is only 60 yo.  The risks and benefits of surgery have been discussed in detail.  I did meet her husband as well.

## 2016-05-29 NOTE — Op Note (Signed)
NAMECHERYLEE, KREKE NO.:  [id number]  MEDICAL RECORD NO.:  [id number]  LOCATION:  [facility]                         FACILITY:  [facility]  PHYSICIAN:  Lind Guest. Ninfa Linden, M.D.DATE OF BIRTH:  1956/01/11  DATE OF PROCEDURE:  05/29/2016 DATE OF DISCHARGE:                              OPERATIVE REPORT   PREOPERATIVE DIAGNOSIS:  Left hip with displaced femoral neck fracture.  POSTOPERATIVE DIAGNOSIS:  Left hip with displaced femoral neck fracture.  PROCEDURE:  Left total hip arthroplasty through direct anterior approach.  IMPLANTS:  DePuy Sector Gription acetabular component size 52, size 36+4 polyethylene liner, size 11 Corail femoral component with standard offset, size 36+5 ceramic hip ball.  SURGEON:  Lind Guest. Ninfa Linden, M.D.  ASSISTANT:  Eduard Roux, MD  ANESTHESIA:  General.  ANTIBIOTICS:  2 g of IV Ancef.  BLOOD LOSS:  100 mL.  COMPLICATIONS:  None.  INDICATIONS:  Ms. 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  She got up early yesterday morning and missed her last step due to darkness and fell directly on her left hip. She had the inability to ambulate and EMS took her to the emergency room.  She was found to have a displaced left hip femoral neck fracture. The consulting orthopedic surgeon saw her as well as [facility]. She was admitted to the Hospitalist Service and now presents today for definitive treatment of this left hip fracture.  I talked to her about total hip arthroplasty versus a hemiarthroplasty and agreed we would go with a total hip arthroplasty due to the fact that she is only 60 years old.  The risks and benefits of surgery were explained to her after a long thorough discussion, I talked to her husband as well.  PROCEDURE DESCRIPTION:  After informed consent was obtained, appropriate left hip was marked.  She was  brought to the operating room, where general anesthesia was obtained while she was on her stretcher.  She already had a Foley catheter in place and traction boots were placed on both her feet.  Next, she was placed supine on the HANA fracture table with the perineal post in place and both legs in inline skeletal traction devices, but no traction applied.  Her left operative hip was then prepped and draped with DuraPrep and sterile drapes.  A time-out was called and she was identified as correct patient, correct left hip. I then made an incision inferior and posterior to the anterior superior iliac spine and carried this just slightly obliquely down the leg.  I dissected down the tensor fascia lata muscle and tensor fascia was divided longitudinally, so I could proceed with a direct anterior approach to the hip.  I identified and cauterized the circumflex vessels and then identified the hip capsule, placing Cobra retractors around the medial and lateral femoral neck.  I then opened up the hip capsule in L- type format finding moderate hematoma.  I was able to irrigate this out easily.  I then placed Cobra retractors within the hip capsule and made a new femoral  neck cut just distal to her fracture, but proximal to the lesser trochanter and completed this with an osteotome and placed a corkscrew guide in the femoral head and removed the femoral head in its entirety and found it to be a decent size femoral head.  I then placed a bent Hohmann over the medial acetabular rim obliquely and cleaned remnants of the acetabular labrum and other debris from within the hip joint.  I then began reaming under direct visualization from a size 42 reamer up to a size 52 with all reamers under direct visualization and the last 2 reamers under direct fluoroscopy so I could obtain my depth of reaming, my inclination, and anteversion.  Once I was pleased with this, I placed the real DePuy Sector Gription  acetabular component size 52, and then a 36+4 neutral polyethylene liner since I had medialized the cup a little.  I then went to the femur.  With the leg externally rotated to 120 degrees, extended and abducted, I was able to place a Mueller retractor medially and a Hohmann retractor behind the greater trochanter.  I released the lateral joint capsule and used a box cutting osteotome to enter femoral canal and a rongeur to lateralize.  I then began broaching from a size 8 broach using the Corail broaching system up to a size 11.  With the size 11, we trialed a standard offset femoral neck and a 36+1.5 hip ball, rolled leg back over and up with traction and rotation reducing the pelvis.  Her range of motion was full with excellent stability, but I felt like she needed a little bit more leg length and offset.  We then dislocated the hip again and removed the trial components.  I placed the real Corail femoral component size 11 this time with standard offset and then a real 36+5 ceramic hip ball, rolled leg back over and up with traction and rotation reducing the pelvis.  We were pleased with leg length, offset, range of motion, and stability.  We then irrigated the soft tissue with normal saline solution using pulsatile lavage.  We closed the joint capsule with interrupted #1 Ethibond suture followed by running #1 Vicryl in the tensor fascia, 0-Vicryl in the deep tissue, 2-0 Vicryl in the subcutaneous tissue, and 4-0 Monocryl subcuticular stitch.  Steri-Strips were applied as well as an Aquacel dressing.  She was taken off the HANA table, awakened, extubated, and taken to the recovery room in stable condition.  All final counts were correct.  There were no complications noted.     Lind Guest. Ninfa Linden, M.D.     CYB/MEDQ  D:  05/29/2016  T:  05/29/2016  Job:  [id number]

## 2016-05-30 DIAGNOSIS — F17200 Nicotine dependence, unspecified, uncomplicated: Secondary | ICD-10-CM

## 2016-05-30 LAB — BASIC METABOLIC PANEL
Anion gap: 5 (ref 5–15)
CALCIUM: 8.4 mg/dL — AB (ref 8.9–10.3)
CO2: 27 mmol/L (ref 22–32)
CREATININE: 0.77 mg/dL (ref 0.44–1.00)
Chloride: 106 mmol/L (ref 101–111)
GFR calc non Af Amer: >60 mL/min (ref >60)
Glucose, Bld: 109 mg/dL — ABNORMAL HIGH (ref 65–99)
Potassium: 4.2 mmol/L (ref 3.5–5.1)
SODIUM: 138 mmol/L (ref 135–145)

## 2016-05-30 LAB — CBC
HEMATOCRIT: 35.4 % — AB (ref 36.0–46.0)
HEMOGLOBIN: 11.6 g/dL — AB (ref 12.0–15.0)
MCH: 31.4 pg (ref 26.0–34.0)
MCHC: 32.8 g/dL (ref 30.0–36.0)
MCV: 95.7 fL (ref 78.0–100.0)
Platelets: 197 10*3/uL (ref 150–400)
RBC: 3.7 MIL/uL — AB (ref 3.87–5.11)
RDW: 13.1 % (ref 11.5–15.5)
WBC: 8 10*3/uL (ref 4.0–10.5)

## 2016-05-30 MED ORDER — ASPIRIN 325 MG PO TBEC: 325.0000 mg | DELAYED_RELEASE_TABLET | Freq: Two times a day (BID) | ORAL | Status: DC

## 2016-05-30 MED ORDER — METHOCARBAMOL 500 MG PO TABS: 500.0000 mg | ORAL_TABLET | Freq: Four times a day (QID) | ORAL | Status: DC | PRN

## 2016-05-30 MED ORDER — OXYCODONE-ACETAMINOPHEN 5-325 MG PO TABS: 1.0000 | ORAL_TABLET | ORAL | Status: DC | PRN

## 2016-05-30 NOTE — Progress Notes (Signed)
Subjective: 1 Day Post-Op Procedure(s) (LRB): ANTERIOR APPROACH TOTAL HIP ARTHROPLASTY  (Left) Patient reports pain as moderate.  Tolerated surgery well.  Making progress with therapy.  Vitals and labs stable.  Objective: Vital signs in last 24 hours: Temp:  [98.2 F (36.8 C)-100.5 F (38.1 C)] 100.5 F (38.1 C) (07/01 2000) Pulse Rate:  [60-70] 70 (07/02 0508) Resp:  [18] 18 (07/02 0508) BP: (103-140)/(50-59) 130/59 mmHg (07/02 0508) SpO2:  [90 %-98 %] 90 % (07/02 0508)  Intake/Output from previous day: 07/01 0701 - 07/02 0700 In: 1560 [P.O.:560; I.V.:1000] Out: 3300 [Urine:3200; Blood:100] Intake/Output this shift:     Recent Labs  05/28/16 0746 05/29/16 1112 05/30/16 0439  HGB 14.7 11.6* 11.6*    Recent Labs  05/29/16 1112 05/30/16 0439  WBC 11.5* 8.0  RBC 3.80* 3.70*  HCT 36.6 35.4*  PLT 212 197    Recent Labs  05/29/16 1112 05/30/16 0439  NA 138 138  K 4.0 4.2  CL 113* 106  CO2 22 27  BUN <5* <5*  CREATININE 0.73 0.77  GLUCOSE 104* 109*  CALCIUM 8.1* 8.4*    Recent Labs  05/28/16 0746  INR 0.99    Sensation intact distally Intact pulses distally Dorsiflexion/Plantar flexion intact Incision: scant drainage  Assessment/Plan: 1 Day Post-Op Procedure(s) (LRB): ANTERIOR APPROACH TOTAL HIP ARTHROPLASTY  (Left) Up with therapy Discharge home with home health soon. Aspirin 325 mg BID for DVT coverage.  [name] Y 05/30/2016, 1:53 PM

## 2016-05-30 NOTE — Progress Notes (Signed)
Occupational Therapy Evaluation Patient Details Name: [name] MRN: [id number] DOB: 1956-08-02 Today's Date: 05/30/2016    History of Present Illness Pt is a 60 y.o. female who sustained a L hip fx from a fall down her steps at home. She underwent anterior THA 05-29-16.   Clinical Impression   PTA, pt independent with ADL and mobility and worked at [employer]. Pt requires S with mobility @ RW level and min A with LB ADL. Will follow up in am to address shower transfers and LB ADL. Pt very motivated to return to PLOF.     Follow Up Recommendations  No OT follow up;Supervision - Intermittent    Equipment Recommendations  3 in 1 bedside comode    Recommendations for Other Services       Precautions / Restrictions Precautions Precautions: Fall Restrictions Weight Bearing Restrictions: Yes LLE Weight Bearing: Weight bearing as tolerated      Mobility Bed Mobility Overal bed mobility: Needs Assistance Bed Mobility: Sit to Supine     Supine to sit: Supervision     General bed mobility comments: increased time  Transfers Overall transfer level: Needs assistance Equipment used: Rolling walker (2 wheeled) Transfers: Sit to/from [facility] Sit to Stand: Supervision Stand pivot transfers: Supervision           Balance Overall balance assessment: Needs assistance Sitting-balance support: No upper extremity supported;Feet supported Sitting balance-Leahy Scale: Normal     Standing balance support: Bilateral upper extremity supported;During functional activity Standing balance-Leahy Scale: Good                              ADL Overall ADL's : Needs assistance/impaired     Grooming: Modified independent;Standing   Upper Body Bathing: Set up;Sitting   Lower Body Bathing: Minimal assistance;Sit to/from stand   Upper Body Dressing : Set up;Sitting   Lower Body Dressing: Minimal assistance;Sit to/from stand   Toilet Transfer:  Supervision/safety;RW;Comfort height toilet   Toileting- Clothing Manipulation and Hygiene: Modified independent;Sit to/from stand       Functional mobility during ADLs: Supervision/safety;Rolling walker;Cueing for safety       Vision     Perception     Praxis      Pertinent Vitals/Pain Pain Assessment: 0-10 Pain Score: 1  Pain Location: L hip Pain Descriptors / Indicators: Discomfort Pain Intervention(s): Limited activity within patient's tolerance     Hand Dominance     Extremity/Trunk Assessment Upper Extremity Assessment Upper Extremity Assessment: Overall WFL for tasks assessed   Lower Extremity Assessment Lower Extremity Assessment: Defer to PT evaluation RLE Deficits / Details: WFL LLE: Unable to fully assess due to pain   Cervical / Trunk Assessment Cervical / Trunk Assessment: Normal   Communication Communication Communication: No difficulties   Cognition Arousal/Alertness: Awake/alert Behavior During Therapy: WFL for tasks assessed/performed Overall Cognitive Status: Within Functional Limits for tasks assessed                     General Comments       Exercises       Shoulder Instructions      Home Living Family/patient expects to be discharged to:: Private residence Living Arrangements: Spouse/significant other Available Help at Discharge: Family;Available 24 hours/day Type of Home: Mobile home Home Access: Stairs to enter Entrance Stairs-Number of Steps: 4 Entrance Stairs-Rails: Right;Left;Can reach both Home Layout: One level     Bathroom Shower/Tub: Walk-in shower Shower/tub characteristics:  Door [employer] Toilet: Standard Bathroom Accessibility: Yes How Accessible: Accessible via walker Home Equipment: None          Prior Functioning/Environment Level of Independence: Independent        Comments: Active. Drives. Works as a Systems developer at [employer].    OT Diagnosis: Generalized weakness;Acute pain   OT Problem  List: Decreased strength;Decreased range of motion;Decreased activity tolerance;Impaired balance (sitting and/or standing);Decreased safety awareness;Decreased knowledge of use of DME or AE;Pain   OT Treatment/Interventions: Self-care/ADL training;DME and/or AE instruction;Therapeutic activities;Patient/family education    OT Goals(Current goals can be found in the care plan section) Acute Rehab OT Goals Patient Stated Goal: return to work OT Goal Formulation: With patient Time For Goal Achievement: 06/06/16 Potential to Achieve Goals: Good  OT Frequency: Min 2X/week   Barriers to D/C:            Co-evaluation              End of Session Equipment Utilized During Treatment: Gait belt;Rolling walker Nurse Communication: Mobility status  Activity Tolerance: Patient tolerated treatment well Patient left: in bed;with call bell/phone within reach;with bed alarm set   Time: 1525-1545 OT Time Calculation (min): 20 min Charges:  OT General Charges $OT Visit: 1 Procedure OT Evaluation $OT Eval Low Complexity: 1 Procedure OT Treatments $Self Care/Home Management : 8-22 mins G-Codes:    Dim Meisinger,HILLARY 06-09-16, 1:51 PM   [facility], OTR/L  [phone] 2016/06/09

## 2016-05-30 NOTE — Evaluation (Signed)
Physical Therapy Evaluation Patient Details Name: [name] MRN: [id number] DOB: [birth year] Today's Date: 05/30/2016   History of Present Illness  Pt is a 60 y.o. female who sustained a L hip fx from a fall down her steps at home. She underwent anterior THA 05-29-16.  Clinical Impression  Pt is s/p THA resulting in the deficits listed below (see PT Problem List). On eval, she required min assist with bed mobility, min guard assist transfers, and min guard assist gait with RW 200 feet. Pt will benefit from skilled PT to increase their independence and safety with mobility to allow discharge to the venue listed below.      Follow Up Recommendations Supervision - Intermittent;Outpatient PT    Equipment Recommendations  Rolling walker with 5" wheels;3in1 (PT)    Recommendations for Other Services       Precautions / Restrictions Precautions Precautions: Fall Restrictions Weight Bearing Restrictions: Yes LLE Weight Bearing: Weight bearing as tolerated      Mobility  Bed Mobility Overal bed mobility: Needs Assistance Bed Mobility: Supine to Sit     Supine to sit: Min assist;HOB elevated     General bed mobility comments: +rail, min assist with LLE  Transfers Overall transfer level: Needs assistance Equipment used: Rolling walker (2 wheeled) Transfers: Sit to/from [facility] Sit to Stand: Min guard Stand pivot transfers: Min guard       General transfer comment: verbal cues for hand placement  Ambulation/Gait Ambulation/Gait assistance: Min guard Ambulation Distance (Feet): 200 Feet Assistive device: Rolling walker (2 wheeled) Gait Pattern/deviations: Step-through pattern;Decreased stride length;Decreased stance time - left Gait velocity: decreased      Stairs            Wheelchair Mobility    Modified Rankin (Stroke Patients Only)       Balance Overall balance assessment: Needs assistance Sitting-balance support: No upper extremity  supported;Feet supported Sitting balance-Leahy Scale: Normal     Standing balance support: Bilateral upper extremity supported;During functional activity Standing balance-Leahy Scale: Good                               Pertinent Vitals/Pain Pain Assessment: 0-10 Pain Score: 2  Pain Location: LLE Pain Descriptors / Indicators: Sore Pain Intervention(s): Monitored during session;Repositioned    Home Living Family/patient expects to be discharged to:: Private residence Living Arrangements: Spouse/significant other Available Help at Discharge: Family;Available 24 hours/day Type of Home: Mobile home Home Access: Stairs to enter Entrance Stairs-Rails: Right;Left;Can reach both Entrance Stairs-Number of Steps: 4 Home Layout: One level Home Equipment: None      Prior Function Level of Independence: Independent         Comments: Active. Drives. Works as a Systems developer at [employer].     Hand Dominance        Extremity/Trunk Assessment   Upper Extremity Assessment: Overall WFL for tasks assessed           Lower Extremity Assessment: RLE deficits/detail;LLE deficits/detail RLE Deficits / Details: WFL    Cervical / Trunk Assessment: Normal  Communication   Communication: No difficulties  Cognition Arousal/Alertness: Awake/alert Behavior During Therapy: WFL for tasks assessed/performed Overall Cognitive Status: Within Functional Limits for tasks assessed                      General Comments      Exercises        Assessment/Plan  PT Assessment Patient needs continued PT services  PT Diagnosis Difficulty walking;Acute pain   PT Problem List Decreased activity tolerance;Decreased balance;Decreased mobility;Pain;Decreased knowledge of use of DME  PT Treatment Interventions DME instruction;Gait training;Stair training;Functional mobility training;Therapeutic activities;Therapeutic exercise;Balance training;Patient/family education   PT  Goals (Current goals can be found in the Care Plan section) Acute Rehab PT Goals Patient Stated Goal: return to work PT Goal Formulation: With patient Time For Goal Achievement: 06/06/16 Potential to Achieve Goals: Good    Frequency Min 6X/week   Barriers to discharge        Co-evaluation               End of Session Equipment Utilized During Treatment: Gait belt Activity Tolerance: Patient tolerated treatment well Patient left: in chair;with family/visitor present;with call bell/phone within reach Nurse Communication: Mobility status         Time: 1115-1141 PT Time Calculation (min) (ACUTE ONLY): 26 min   Charges:   PT Evaluation $PT Eval Moderate Complexity: 1 Procedure PT Treatments $Gait Training: 8-22 mins   PT G Codes:        Lorriane Shire 05/30/2016, 1:28 PM

## 2016-05-30 NOTE — Discharge Instructions (Signed)

## 2016-05-30 NOTE — Progress Notes (Signed)
PROGRESS NOTE  [name] [id number] DOB: 14-Mar-1956 DOA: 05/28/2016 PCP: Weston Settle   LOS: 2 days   Brief Narrative: 60 year old female with history of CAD, tobacco abuse, presents with mechanical fall, resulting in left hip fracture  Assessment & Plan: Principal Problem:   Closed left hip fracture (HCC) Active Problems:   TOBACCO ABUSE   Coronary atherosclerosis   BRONCHITIS, CHRONIC NOS   GERD   Hip fracture, left (HCC)   Hypertension   Hypokalemia   Left Hip Fracture  - hip x ray remarkable for Left-sided subcapital femoral neck fracture with varus angulation and mild impaction at the fracture site. - orthopedic surgery consulted, patient underwent anterior approach left THA 7/1 - PT to see today   ABLA - mild, stable Hb - repeat tomorrow  Tobacco abuse - Duonebs q 6 h - Incentive Spirometry  - discussed today re cessation again, not interested in quitting  CAD - had a cardiac catheterization with stent in [place] 2007 - no chest pain   Hypertension - Was not on antihypertensives prior to presentation  - BP remains within normal parameters today, 130/59. Will need further monitoring as an outpatient  Hypokalemia,  - resolved after repletion  Hyperlipidemia   DVT prophylaxis: per ortho post op Code Status: Full Family Communication: no family bedside Disposition Plan: home vs SNF 1-2 days  Consultants:   Orthopedic surgery   Procedures:  left THA 7/1   Antimicrobials:  Peri op Ancef   Subjective: - eating breakfast, no significant pain  Objective: Filed Vitals:   05/29/16 1148 05/29/16 1500 05/29/16 2000 05/30/16 0508  BP: 95/51 103/50 140/58 130/59  Pulse: 62 60 67 70  Temp: 98 F (36.7 C) 98.2 F (36.8 C) 100.5 F (38.1 C)   TempSrc: Oral Oral Oral   Resp: 18 18 18 18   Height:      Weight:      SpO2: 99% 98% 94% 90%    Intake/Output Summary (Last 24 hours) at 05/30/16 1436 Last data filed at 05/30/16  1130  Gross per 24 hour  Intake    360 ml  Output   3200 ml  Net  -2840 ml   Filed Weights   05/28/16 0710  Weight: 63.504 kg (140 lb)    Examination: Constitutional: NAD Filed Vitals:   05/29/16 1148 05/29/16 1500 05/29/16 2000 05/30/16 0508  BP: 95/51 103/50 140/58 130/59  Pulse: 62 60 67 70  Temp: 98 F (36.7 C) 98.2 F (36.8 C) 100.5 F (38.1 C)   TempSrc: Oral Oral Oral   Resp: 18 18 18 18   Height:      Weight:      SpO2: 99% 98% 94% 90%   Eyes: PERRL ENMT: Mucous membranes are moist. Respiratory: clear to auscultation bilaterally, no wheezing, no crackles.  Cardiovascular: Regular rate and rhythm, no murmurs / rubs / gallops.  Abdomen: no tenderness. Bowel sounds positive Neurologic: non focal Psychiatric: Normal judgment and insight. Alert and oriented x 3. Normal mood.    Data Reviewed: I have personally reviewed following labs and imaging studies  CBC:  Recent Labs Lab 05/28/16 0746 05/29/16 1112 05/30/16 0439  WBC 8.1 11.5* 8.0  NEUTROABS 6.2  --   --   HGB 14.7 11.6* 11.6*  HCT 43.5 36.6 35.4*  MCV 94.6 96.3 95.7  PLT 264 212 [id number]   Basic Metabolic Panel:  Recent Labs Lab 05/28/16 0746 05/28/16 1104 05/29/16 1112 05/30/16 0439  NA 139  --  138 138  K 2.7*  --  4.0 4.2  CL 104  --  113* 106  CO2 26  --  22 27  GLUCOSE 101*  --  104* 109*  BUN <5*  --  <5* <5*  CREATININE 0.90  --  0.73 0.77  CALCIUM 9.1  --  8.1* 8.4*  MG  --  2.1  --   --    Coagulation Profile:  Recent Labs Lab 05/28/16 0746  INR 0.99   Lipid Profile:  Recent Labs  05/29/16 1112  CHOL 164  HDL 40*  LDLCALC 105*  TRIG 95  CHOLHDL 4.1   Urine analysis:    Component Value Date/Time   COLORURINE YELLOW 05/28/2016 [place] 05/28/2016 1647   LABSPEC 1.004* 05/28/2016 1647   PHURINE 6.0 05/28/2016 1647   GLUCOSEU NEGATIVE 05/28/2016 1647   HGBUR NEGATIVE 05/28/2016 1647   BILIRUBINUR NEGATIVE 05/28/2016 1647   KETONESUR NEGATIVE  05/28/2016 1647   PROTEINUR NEGATIVE 05/28/2016 1647   NITRITE NEGATIVE 05/28/2016 1647   LEUKOCYTESUR NEGATIVE 05/28/2016 1647   Sepsis Labs: Invalid input(s): PROCALCITONIN, LACTICIDVEN  Recent Results (from the past 240 hour(s))  MRSA PCR Screening     Status: None   Collection Time: 05/28/16 11:50 PM  Result Value Ref Range Status   MRSA by PCR NEGATIVE NEGATIVE Final    Comment:        The GeneXpert MRSA Assay (FDA approved for NASAL specimens only), is one component of a comprehensive MRSA colonization surveillance program. It is not intended to diagnose MRSA infection nor to guide or monitor treatment for MRSA infections.       Radiology Studies: Pelvis Portable  05/29/2016  CLINICAL DATA:  Status post left hip replacement EXAM: PORTABLE PELVIS 1-2 VIEWS COMPARISON:  None. FINDINGS: Left hip prosthesis is noted. Air is noted in the surgical bed. No acute bony abnormality is seen. IMPRESSION: Status post left hip replacement Electronically Signed   By: Inez Catalina M.D.   On: 05/29/2016 10:02   Dg Hip Operative Unilat W Or W/o Pelvis Left  05/29/2016  CLINICAL DATA:  Left total hip arthroplasty EXAM: OPERATIVE LEFT HIP (WITH PELVIS IF PERFORMED) 4 VIEWS TECHNIQUE: Fluoroscopic spot image(s) were submitted for interpretation post-operatively. COMPARISON:  05/28/2016 left hip radiographs FINDINGS: Fluoroscopy time 36 seconds. Four spot fluoroscopic nondiagnostic left hip radiographs demonstrate postsurgical changes from left total hip arthroplasty. IMPRESSION: Intraoperative fluoroscopic guidance for left total hip arthroplasty. Electronically Signed   By: Ilona Sorrel M.D.   On: 05/29/2016 09:35     Scheduled Meds: . aspirin EC  325 mg Oral BID PC  . docusate sodium  100 mg Oral BID   Continuous Infusions: . sodium chloride 75 mL/hr at 05/28/16 1051  . sodium chloride 50 mL/hr at 05/29/16 1115    Marzetta Board, MD, PhD [facility] Pager [phone]  If  7PM-7AM, please contact night-coverage [email or web address] Password [facility] 05/30/2016, 2:36 PM

## 2016-05-30 NOTE — Progress Notes (Signed)
Orthopedic Tech Progress Note Patient Details:  [name] 09-Nov-1956 [id number]  Ortho Devices Ortho Device/Splint Location: Trapeze bar Ortho Device/Splint Interventions: Application   [place] Pink 05/30/2016, 12:49 PM

## 2016-05-30 NOTE — Progress Notes (Signed)
   Subjective:  Patient reports pain as mild.    Objective:   VITALS:   Filed Vitals:   05/29/16 1148 05/29/16 1500 05/29/16 2000 05/30/16 0508  BP: 95/51 103/50 140/58 130/59  Pulse: 62 60 67 70  Temp: 98 F (36.7 C) 98.2 F (36.8 C) 100.5 F (38.1 C)   TempSrc: Oral Oral Oral   Resp: 18 18 18 18   Height:      Weight:      SpO2: 99% 98% 94% 90%    Neurologically intact Neurovascular intact Sensation intact distally Intact pulses distally Dorsiflexion/Plantar flexion intact Incision: dressing C/D/I and no drainage No cellulitis present Compartment soft   Lab Results  Component Value Date   WBC 8.0 05/30/2016   HGB 11.6* 05/30/2016   HCT 35.4* 05/30/2016   MCV 95.7 05/30/2016   PLT 197 05/30/2016     Assessment/Plan:  1 Day Post-Op   - Expected postop acute blood loss anemia - will monitor for symptoms - Up with PT/OT - DVT ppx - SCDs, ambulation, aspirin - WBAT operative extremity - Pain control - Discharge planning per hospitalist  Marianna Payment 05/30/2016, 8:37 AM [phone]

## 2016-05-31 ENCOUNTER — Encounter (HOSPITAL_COMMUNITY): Payer: Self-pay | Admitting: Orthopaedic Surgery

## 2016-05-31 LAB — CBC
HCT: 31.8 % — ABNORMAL LOW (ref 36.0–46.0)
HEMOGLOBIN: 10.5 g/dL — AB (ref 12.0–15.0)
MCH: 31.3 pg (ref 26.0–34.0)
MCHC: 33 g/dL (ref 30.0–36.0)
MCV: 94.9 fL (ref 78.0–100.0)
Platelets: 192 10*3/uL (ref 150–400)
RBC: 3.35 MIL/uL — AB (ref 3.87–5.11)
RDW: 13.1 % (ref 11.5–15.5)
WBC: 7.7 10*3/uL (ref 4.0–10.5)

## 2016-05-31 MED ORDER — UNABLE TO FIND: Status: DC

## 2016-05-31 MED ORDER — PANTOPRAZOLE SODIUM 40 MG PO TBEC: 40.0000 mg | DELAYED_RELEASE_TABLET | Freq: Every day | ORAL | Status: DC

## 2016-05-31 NOTE — Progress Notes (Signed)
OT Cancellation Note  Patient Details Name: [name] MRN: [id number] DOB: 07-14-1956   Cancelled Treatment:    Reason Eval/Treat Not Completed: Other (comment). Pt preparing to d/c home. DME had been delivered. Pt dressed and ambulating with RW from bathroom  to bed upon entering room. Pt's son in room with her to take her home  Britt Bottom 05/31/2016, 2:34 PM

## 2016-05-31 NOTE — Progress Notes (Signed)
PT Cancellation Note  Patient Details Name: [name] MRN: [id number] DOB: May 18, 1956   Cancelled Treatment:    Reason Eval/Treat Not Completed: Other (comment) (pt ride here and ready to d/c, refused therapy and reports she is confident to negotiate stairs at home.  )   [name] 05/31/2016, 2:54 PM  Governor Rooks, PTA pager [phone]

## 2016-05-31 NOTE — Discharge Summary (Signed)
Physician Discharge Summary  [name] [id number] DOB: [birth year] DOA: 05/28/2016  PCP: Weston Settle  Admit date: 05/28/2016 Discharge date: 05/31/2016  Admitted From: home Disposition:  home  Recommendations for Outpatient Follow-up:  1. Follow up with Dr. Ninfa Linden in 2 weeks   Home Health: none, outpatient PT Equipment/Devices: Rolling walker; 3 in 1  Discharge Condition: stable CODE STATUS: Full Diet recommendation: regular  HPI: [name] is a 60 y.o. female with medical history significant for CAD, HLD, Tobacco abuse, not seen by PCP in many years, presenting to the emergency department after a full experienced today as she was walking down her steps.The patient states that it was very dark, and she did not see the last step which caused her to land on cementon her left hip, with subsequent decreas in her range of motion and with pain with external rotation. She remained about 45 minutes on the floor and feels her custom found her and was able to call for help. Pain on admission was severe, now well controlled with meds. She denies any preceding symptoms such as dizziness, nausea, chest pain or palpitations. She denies any vision changes. The patient did not heat head or had any acute confusional issues prior to this fall . She denies any neck pain. She denies any shortness of breath, or cough. She denies any bowel or urine incontinence after a fall. No seizures or confusion noted. No history of stroke or TIA. She can move her left foot without difficulty and sensation remains intact.   Hospital Course: Discharge Diagnoses:  Principal Problem:   Closed left hip fracture (HCC) Active Problems:   TOBACCO ABUSE   Coronary atherosclerosis   BRONCHITIS, CHRONIC NOS   GERD   Hip fracture, left (HCC)   Hypertension   Hypokalemia  Left Hip Fracture - hip x ray remarkable for Left-sided subcapital femoral neck fracture with varus angulation and mild impaction at the  fracture site. Orthopedic surgery consulted, patient underwent anterior approach left THA 7/1. Patient did well post op, and on PT evaluation was able to ambulate 200 ft, PT recommending outpatient PT.  ABLA - mild, stable Hb Tobacco abuse - discussed re cessation, not interested in quitting CAD - had a cardiac catheterization with stent in [facility] 2007, no chest pain and fairly active at home without anginal symptoms Hypertension - carries a diagnosis however she is normotensive currently.  Hypokalemia - resolved after repletion   Discharge Instructions  Discharge Instructions    Full weight bearing    Complete by:  As directed             Medication List    TAKE these medications        aspirin 325 MG EC tablet  Take 1 tablet (325 mg total) by mouth 2 (two) times daily after a meal.     methocarbamol 500 MG tablet  Commonly known as:  ROBAXIN  Take 1 tablet (500 mg total) by mouth every 6 (six) hours as needed for muscle spasms.     oxyCODONE-acetaminophen 5-325 MG tablet  Commonly known as:  ROXICET  Take 1-2 tablets by mouth every 4 (four) hours as needed for severe pain.     pantoprazole 40 MG tablet  Commonly known as:  PROTONIX  Take 1 tablet (40 mg total) by mouth daily.     UNABLE TO FIND  Outpatient PT           Follow-up Information    Follow up with  FERREIRA,CORNELIUS.   Specialty:  Family Medicine   Contact information:   [address] [place] [address] [phone]       Follow up with Mcarthur Rossetti, MD. Schedule an appointment as soon as possible for a visit in 2 weeks.   Specialty:  Orthopedic Surgery   Contact information:   [address] [place] [address] [phone]      Allergies  Allergen Reactions  . Cortisone     REACTION: Fluid retention and hives    Consultations:  Orthopedic surgery   Procedures/Studies: anterior approach left THA 7/1  Dg Chest 1 View  05/28/2016  CLINICAL  DATA:  Pain following fall EXAM: CHEST 1 VIEW COMPARISON:  September 23, 2006 FINDINGS: There is no edema or consolidation. The heart size and pulmonary vascularity are normal. No adenopathy. There is fatty prominence at the right cardiophrenic angle, stable. No pneumothorax. No bone lesions. IMPRESSION: No edema or consolidation. Electronically Signed   By: Lowella Grip III M.D.   On: 05/28/2016 08:10   Pelvis Portable  05/29/2016  CLINICAL DATA:  Status post left hip replacement EXAM: PORTABLE PELVIS 1-2 VIEWS COMPARISON:  None. FINDINGS: Left hip prosthesis is noted. Air is noted in the surgical bed. No acute bony abnormality is seen. IMPRESSION: Status post left hip replacement Electronically Signed   By: Inez Catalina M.D.   On: 05/29/2016 10:02   Dg Hip Operative Unilat W Or W/o Pelvis Left  05/29/2016  CLINICAL DATA:  Left total hip arthroplasty EXAM: OPERATIVE LEFT HIP (WITH PELVIS IF PERFORMED) 4 VIEWS TECHNIQUE: Fluoroscopic spot image(s) were submitted for interpretation post-operatively. COMPARISON:  05/28/2016 left hip radiographs FINDINGS: Fluoroscopy time 36 seconds. Four spot fluoroscopic nondiagnostic left hip radiographs demonstrate postsurgical changes from left total hip arthroplasty. IMPRESSION: Intraoperative fluoroscopic guidance for left total hip arthroplasty. Electronically Signed   By: Ilona Sorrel M.D.   On: 05/29/2016 09:35   Dg Hip Unilat With Pelvis 2-3 Views Left  05/28/2016  CLINICAL DATA:  Pain following fall EXAM: DG HIP (WITH OR WITHOUT PELVIS) 2-3V LEFT COMPARISON:  None. FINDINGS: Frontal pelvis as well as frontal and lateral left hip images were obtained. There is a subcapital femoral neck fracture on the left with varus angulation and mild impaction at the fracture site. No other fractures are evident. No dislocation. There is slight symmetric narrowing of both hip joints. No erosive change. 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 There are foci of vascular  calcification in the pelvis. IMPRESSION: Left-sided subcapital femoral neck fracture with varus angulation and mild impaction at the fracture site. No other fracture. No dislocation. Slight symmetric narrowing of both hip joints noted. Periarticular osteoporosis noted. Atherosclerosis noted in several pelvic arterial vessels. Electronically Signed   By: Lowella Grip III M.D.   On: 05/28/2016 08:08      Subjective: - no complaints, ambulating in the room, anxious to go home   Discharge Exam: Filed Vitals:   05/30/16 1937 05/31/16 0349  BP: 114/54 109/49  Pulse: 80 66  Temp: 98.7 F (37.1 C) 99.7 F (37.6 C)  Resp: 18 18   Filed Vitals:   05/30/16 0508 05/30/16 1500 05/30/16 1937 05/31/16 0349  BP: 130/59 124/53 114/54 109/49  Pulse: 70 65 80 66  Temp:  98.4 F (36.9 C) 98.7 F (37.1 C) 99.7 F (37.6 C)  TempSrc:  Oral Oral Oral  Resp: 18 18 18 18   Height:      Weight:  SpO2: 90% 98% 97% 93%    General: Pt is alert, awake, not in acute distress Cardiovascular: RRR, S1/S2 +, no rubs, no gallops Respiratory: CTA bilaterally, no wheezing, no rhonchi Abdominal: Soft, NT, ND, bowel sounds + Extremities: no edema, no cyanosis    The results of significant diagnostics from this hospitalization (including imaging, microbiology, ancillary and laboratory) are listed below for reference.     Microbiology: Recent Results (from the past 240 hour(s))  MRSA PCR Screening     Status: None   Collection Time: 05/28/16 11:50 PM  Result Value Ref Range Status   MRSA by PCR NEGATIVE NEGATIVE Final    Comment:        The GeneXpert MRSA Assay (FDA approved for NASAL specimens only), is one component of a comprehensive MRSA colonization surveillance program. It is not intended to diagnose MRSA infection nor to guide or monitor treatment for MRSA infections.      Labs: BNP (last 3 results) No results for input(s): BNP in the last 8760 hours. Basic Metabolic  Panel:  Recent Labs Lab 05/28/16 0746 05/28/16 1104 05/29/16 1112 05/30/16 0439  NA 139  --  138 138  K 2.7*  --  4.0 4.2  CL 104  --  113* 106  CO2 26  --  22 27  GLUCOSE 101*  --  104* 109*  BUN <5*  --  <5* <5*  CREATININE 0.90  --  0.73 0.77  CALCIUM 9.1  --  8.1* 8.4*  MG  --  2.1  --   --    Liver Function Tests: No results for input(s): AST, ALT, ALKPHOS, BILITOT, PROT, ALBUMIN in the last 168 hours. No results for input(s): LIPASE, AMYLASE in the last 168 hours. No results for input(s): AMMONIA in the last 168 hours. CBC:  Recent Labs Lab 05/28/16 0746 05/29/16 1112 05/30/16 0439 05/31/16 0308  WBC 8.1 11.5* 8.0 7.7  NEUTROABS 6.2  --   --   --   HGB 14.7 11.6* 11.6* 10.5*  HCT 43.5 36.6 35.4* 31.8*  MCV 94.6 96.3 95.7 94.9  PLT 264 212 197 192   Cardiac Enzymes: No results for input(s): CKTOTAL, CKMB, CKMBINDEX, TROPONINI in the last 168 hours. BNP: Invalid input(s): POCBNP CBG: No results for input(s): GLUCAP in the last 168 hours. D-Dimer No results for input(s): DDIMER in the last 72 hours. Hgb A1c No results for input(s): HGBA1C in the last 72 hours. Lipid Profile  Recent Labs  05/29/16 1112  CHOL 164  HDL 40*  LDLCALC 105*  TRIG 95  CHOLHDL 4.1   Thyroid function studies No results for input(s): TSH, T4TOTAL, T3FREE, THYROIDAB in the last 72 hours.  Invalid input(s): FREET3 Anemia work up No results for input(s): VITAMINB12, FOLATE, FERRITIN, TIBC, IRON, RETICCTPCT in the last 72 hours. Urinalysis    Component Value Date/Time   COLORURINE YELLOW 05/28/2016 [place] 05/28/2016 1647   LABSPEC 1.004* 05/28/2016 1647   PHURINE 6.0 05/28/2016 1647   GLUCOSEU NEGATIVE 05/28/2016 1647   HGBUR NEGATIVE 05/28/2016 1647   BILIRUBINUR NEGATIVE 05/28/2016 1647   KETONESUR NEGATIVE 05/28/2016 1647   PROTEINUR NEGATIVE 05/28/2016 1647   NITRITE NEGATIVE 05/28/2016 1647   LEUKOCYTESUR NEGATIVE 05/28/2016 1647   Sepsis  Labs Invalid input(s): PROCALCITONIN,  WBC,  LACTICIDVEN Microbiology Recent Results (from the past 240 hour(s))  MRSA PCR Screening     Status: None   Collection Time: 05/28/16 11:50 PM  Result Value Ref Range Status  MRSA by PCR NEGATIVE NEGATIVE Final    Comment:        The GeneXpert MRSA Assay (FDA approved for NASAL specimens only), is one component of a comprehensive MRSA colonization surveillance program. It is not intended to diagnose MRSA infection nor to guide or monitor treatment for MRSA infections.      Time coordinating discharge: Over 30 minutes  SIGNED:  Marzetta Board, MD  [facility] 05/31/2016, 3:13 PM Pager [phone]  If 7PM-7AM, please contact night-coverage [email or web address] Password [email or web address]

## 2019-05-01 ENCOUNTER — Emergency Department (HOSPITAL_COMMUNITY): Payer: Self-pay

## 2019-05-01 ENCOUNTER — Other Ambulatory Visit: Payer: Self-pay

## 2019-05-01 ENCOUNTER — Inpatient Hospital Stay (HOSPITAL_COMMUNITY)
Admission: EM | Admit: 2019-05-01 | Discharge: 2019-05-04 | DRG: 167 | Disposition: A | Discharge status: Home / Self Care | Payer: Self-pay | Attending: Internal Medicine | Admitting: Internal Medicine

## 2019-05-01 ENCOUNTER — Other Ambulatory Visit (HOSPITAL_COMMUNITY): Payer: Self-pay

## 2019-05-01 ENCOUNTER — Encounter (HOSPITAL_COMMUNITY): Payer: Self-pay

## 2019-05-01 DIAGNOSIS — Z85828 Personal history of other malignant neoplasm of skin: Secondary | ICD-10-CM

## 2019-05-01 DIAGNOSIS — I739 Peripheral vascular disease, unspecified: Secondary | ICD-10-CM | POA: Diagnosis present

## 2019-05-01 DIAGNOSIS — H5461 Unqualified visual loss, right eye, normal vision left eye: Secondary | ICD-10-CM | POA: Diagnosis present

## 2019-05-01 DIAGNOSIS — E663 Overweight: Secondary | ICD-10-CM | POA: Diagnosis present

## 2019-05-01 DIAGNOSIS — E876 Hypokalemia: Secondary | ICD-10-CM | POA: Diagnosis present

## 2019-05-01 DIAGNOSIS — Z1159 Encounter for screening for other viral diseases: Secondary | ICD-10-CM

## 2019-05-01 DIAGNOSIS — J189 Pneumonia, unspecified organism: Secondary | ICD-10-CM | POA: Diagnosis present

## 2019-05-01 DIAGNOSIS — E785 Hyperlipidemia, unspecified: Secondary | ICD-10-CM | POA: Diagnosis present

## 2019-05-01 DIAGNOSIS — G8929 Other chronic pain: Secondary | ICD-10-CM | POA: Diagnosis present

## 2019-05-01 DIAGNOSIS — J181 Lobar pneumonia, unspecified organism: Principal | ICD-10-CM

## 2019-05-01 DIAGNOSIS — I2510 Atherosclerotic heart disease of native coronary artery without angina pectoris: Secondary | ICD-10-CM | POA: Diagnosis present

## 2019-05-01 DIAGNOSIS — Z7982 Long term (current) use of aspirin: Secondary | ICD-10-CM

## 2019-05-01 DIAGNOSIS — Z87891 Personal history of nicotine dependence: Secondary | ICD-10-CM

## 2019-05-01 DIAGNOSIS — Z79891 Long term (current) use of opiate analgesic: Secondary | ICD-10-CM

## 2019-05-01 DIAGNOSIS — M25552 Pain in left hip: Secondary | ICD-10-CM | POA: Diagnosis present

## 2019-05-01 DIAGNOSIS — Z90710 Acquired absence of both cervix and uterus: Secondary | ICD-10-CM

## 2019-05-01 DIAGNOSIS — Z96642 Presence of left artificial hip joint: Secondary | ICD-10-CM | POA: Diagnosis present

## 2019-05-01 DIAGNOSIS — N179 Acute kidney failure, unspecified: Secondary | ICD-10-CM

## 2019-05-01 DIAGNOSIS — Z6828 Body mass index (BMI) 28.0-28.9, adult: Secondary | ICD-10-CM

## 2019-05-01 DIAGNOSIS — Z809 Family history of malignant neoplasm, unspecified: Secondary | ICD-10-CM

## 2019-05-01 DIAGNOSIS — R519 Headache, unspecified: Secondary | ICD-10-CM

## 2019-05-01 LAB — URINALYSIS, ROUTINE W REFLEX MICROSCOPIC
Bilirubin Urine: NEGATIVE
Glucose, UA: NEGATIVE mg/dL
Ketones, ur: NEGATIVE mg/dL
Leukocytes,Ua: NEGATIVE
Nitrite: NEGATIVE
Protein, ur: 100 mg/dL — AB
Specific Gravity, Urine: 1.021 (ref 1.005–1.030)
pH: 5 (ref 5.0–8.0)

## 2019-05-01 LAB — COMPREHENSIVE METABOLIC PANEL
ALT: 24 U/L (ref 0–44)
AST: 33 U/L (ref 15–41)
Albumin: 2.8 g/dL — ABNORMAL LOW (ref 3.5–5.0)
Alkaline Phosphatase: 120 U/L (ref 38–126)
Anion gap: 17 — ABNORMAL HIGH (ref 5–15)
BUN: 39 mg/dL — ABNORMAL HIGH (ref 8–23)
CO2: 15 mmol/L — ABNORMAL LOW (ref 22–32)
Calcium: 8.4 mg/dL — ABNORMAL LOW (ref 8.9–10.3)
Chloride: 98 mmol/L (ref 98–111)
Creatinine, Ser: 2.15 mg/dL — ABNORMAL HIGH (ref 0.44–1.00)
GFR calc Af Amer: 28 mL/min — ABNORMAL LOW (ref >60)
GFR calc non Af Amer: 24 mL/min — ABNORMAL LOW (ref >60)
Glucose, Bld: 135 mg/dL — ABNORMAL HIGH (ref 70–99)
Potassium: 3.2 mmol/L — ABNORMAL LOW (ref 3.5–5.1)
Sodium: 130 mmol/L — ABNORMAL LOW (ref 135–145)
Total Bilirubin: 0.2 mg/dL — ABNORMAL LOW (ref 0.3–1.2)
Total Protein: 7 g/dL (ref 6.5–8.1)

## 2019-05-01 LAB — CBC WITH DIFFERENTIAL/PLATELET
Abs Immature Granulocytes: 0.17 10*3/uL — ABNORMAL HIGH (ref 0.00–0.07)
Basophils Absolute: 0.1 10*3/uL (ref 0.0–0.1)
Basophils Relative: 1 %
Eosinophils Absolute: 0 10*3/uL (ref 0.0–0.5)
Eosinophils Relative: 0 %
HCT: 38.3 % (ref 36.0–46.0)
Hemoglobin: 13.3 g/dL (ref 12.0–15.0)
Immature Granulocytes: 1 %
Lymphocytes Relative: 11 %
Lymphs Abs: 1.3 10*3/uL (ref 0.7–4.0)
MCH: 32.8 pg (ref 26.0–34.0)
MCHC: 34.7 g/dL (ref 30.0–36.0)
MCV: 94.3 fL (ref 80.0–100.0)
Monocytes Absolute: 0.6 10*3/uL (ref 0.1–1.0)
Monocytes Relative: 5 %
Neutro Abs: 9.7 10*3/uL — ABNORMAL HIGH (ref 1.7–7.7)
Neutrophils Relative %: 82 %
Platelets: 313 10*3/uL (ref 150–400)
RBC: 4.06 MIL/uL (ref 3.87–5.11)
RDW: 13.2 % (ref 11.5–15.5)
WBC: 11.8 10*3/uL — ABNORMAL HIGH (ref 4.0–10.5)
nRBC: 0 % (ref 0.0–0.2)

## 2019-05-01 LAB — C-REACTIVE PROTEIN: CRP: 48 mg/dL — ABNORMAL HIGH (ref <1.0)

## 2019-05-01 LAB — SEDIMENTATION RATE: Sed Rate: 108 mm/hr — ABNORMAL HIGH (ref 0–22)

## 2019-05-01 LAB — SARS CORONAVIRUS 2 BY RT PCR (HOSPITAL ORDER, PERFORMED IN ~~LOC~~ HOSPITAL LAB): SARS Coronavirus 2: NEGATIVE

## 2019-05-01 LAB — LACTIC ACID, PLASMA: Lactic Acid, Venous: 2.5 mmol/L (ref 0.5–1.9)

## 2019-05-01 MED ORDER — SODIUM CHLORIDE 0.9 % IV SOLN
500.0000 mg | Freq: Once | INTRAVENOUS | Status: AC
  Administered 2019-05-01: 500 mg via INTRAVENOUS
  Filled 2019-05-01: qty 500

## 2019-05-01 MED ORDER — SODIUM CHLORIDE 0.9 % IV SOLN
1.0000 g | Freq: Once | INTRAVENOUS | Status: AC
  Administered 2019-05-01: 21:00:00 1 g via INTRAVENOUS
  Filled 2019-05-01: qty 10

## 2019-05-01 MED ORDER — ACETAMINOPHEN 325 MG PO TABS
650.0000 mg | ORAL_TABLET | Freq: Once | ORAL | Status: AC
  Administered 2019-05-01: 650 mg via ORAL
  Filled 2019-05-01: qty 2

## 2019-05-01 MED ORDER — SODIUM CHLORIDE 0.9 % IV BOLUS
1000.0000 mL | Freq: Once | INTRAVENOUS | Status: AC
  Administered 2019-05-01: 1000 mL via INTRAVENOUS

## 2019-05-01 NOTE — ED Triage Notes (Addendum)
Pt states she has been having headaches for the last couple weeks on the right side of her head. Pt denies weakness or dizziness. Pt denies fever but temp in triage 101.2

## 2019-05-01 NOTE — ED Notes (Signed)
Pt called x1

## 2019-05-01 NOTE — ED Notes (Signed)
ED Provider at bedside. 

## 2019-05-01 NOTE — ED Provider Notes (Signed)
[facility] EMERGENCY DEPARTMENT Provider Note   CSN: [id number] Arrival date & time: 05/01/19  1505    History   Chief Complaint Chief Complaint  Patient presents with   Headache    HPI [name] is a 63 y.o. female presenting today for headache.  Patient reports that she has had a right-sided headache for approximately 1 year she reports that headache has been constant daily without aggravating or alleviating factors.  Patient reports over the last 1.5 months her pain has worsened she describes a severe constant aching throb sometimes worsened with palpation of the right temporalis muscle without alleviating factors.  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  She has no other concerns or complaints today.  On arrival patient noted to be febrile and tachycardic she was unaware that she was febrile prior to being told by triage staff.  Patient denies neck pain/stiffness, photophobia, chest pain/shortness of breath, cough, abdominal pain, nausea/vomiting, diarrhea, extremity pain, numbness/weakness or tingling, dizziness/lightheadedness or any additional concerns.  Of note patient reports that she quit smoking 2 weeks ago.     HPI  Past Medical History:  Diagnosis Date   Coronary artery disease    Endometriosis    Hyperlipidemia    Hypotension    Ruptured cervical disc    Skin cancer    Tobacco abuse     Patient Active Problem List   Diagnosis Date Noted   Hip fracture, left ([place]) 05/28/2016   Hypertension 05/28/2016   Hypokalemia 05/28/2016   Closed left hip fracture ([place]) 05/28/2016   CARCINOMA, SKIN, SQUAMOUS CELL 06/05/2009   RESTLESS LEG SYNDROME 06/06/2007   GERD 01/27/2007   HYPERLIPIDEMIA 10/19/2006   TOBACCO ABUSE 10/19/2006   Coronary atherosclerosis 10/19/2006   BRONCHITIS, CHRONIC NOS 10/19/2006   ASTHMA 10/19/2006   LOW BACK PAIN 10/19/2006   HYPERGLYCEMIA 10/19/2006    Past  Surgical History:  Procedure Laterality Date   ABDOMINAL HYSTERECTOMY     ANTERIOR APPROACH HEMI HIP ARTHROPLASTY Left 05/29/2016   Procedure: ANTERIOR APPROACH TOTAL HIP ARTHROPLASTY ;  Surgeon: Mcarthur Rossetti, MD;  Location: [facility];  Service: Orthopedics;  Laterality: Left;   CARDIAC CATHETERIZATION     CHOLECYSTECTOMY     TONSILLECTOMY       OB History   No obstetric history on file.      Home Medications    Prior to Admission medications   Medication Sig Start Date End Date Taking? Authorizing Provider  aspirin EC 325 MG EC tablet Take 1 tablet (325 mg total) by mouth 2 (two) times daily after a meal. 05/30/16   Mcarthur Rossetti, MD  methocarbamol (ROBAXIN) 500 MG tablet Take 1 tablet (500 mg total) by mouth every 6 (six) hours as needed for muscle spasms. 05/30/16   Mcarthur Rossetti, MD  oxyCODONE-acetaminophen (ROXICET) 5-325 MG tablet Take 1-2 tablets by mouth every 4 (four) hours as needed for severe pain. 05/30/16   Mcarthur Rossetti, MD  pantoprazole (PROTONIX) 40 MG tablet Take 1 tablet (40 mg total) by mouth daily. 05/31/16   Caren Griffins, MD    Family History No family history on file.  Social History Social History   Tobacco Use   Smoking status: Former Smoker    Packs/day: 1.00   Smokeless tobacco: Never Used  Substance Use Topics   Alcohol use: Not Currently   Drug use: Not Currently     Allergies  Cortisone   Review of Systems Review of Systems  Constitutional: Positive for fever (Febrile on arrival, unaware prior). Negative for chills.  Respiratory: Negative.  Negative for cough and shortness of breath.   Cardiovascular: Negative.  Negative for chest pain.  Gastrointestinal: Negative.  Negative for abdominal pain, nausea and vomiting.  Genitourinary: Negative.  Negative for dysuria and hematuria.  Musculoskeletal: Negative.  Negative for back pain, neck pain and neck stiffness.  Neurological: Positive for  headaches. Negative for dizziness, syncope, weakness and numbness.  All other systems reviewed and are negative.  Physical Exam Updated Vital Signs BP (!) 109/52    Pulse 83    Temp 98.6 F (37 C) (Oral)    Resp (!) 22    Ht 5' 5.5" (1.664 m)    Wt 79.4 kg    SpO2 98%    BMI 28.68 kg/m   Physical Exam Constitutional:      General: She is not in acute distress.    Appearance: Normal appearance. She is well-developed. She is not ill-appearing or diaphoretic.  HENT:     Head: Normocephalic and atraumatic. No raccoon eyes, Battle's sign, abrasion or contusion.     Jaw: There is normal jaw occlusion. No trismus.      Comments: Patient reports mild tenderness around the right temporalis muscle/temporal artery    Right Ear: External ear normal.     Left Ear: External ear normal.     Ears:     Comments: Hearing grossly intact bilaterally    Nose: Nose normal. No rhinorrhea.     Right Nostril: No epistaxis.     Left Nostril: No epistaxis.     Mouth/Throat:     Lips: Pink.     Mouth: Mucous membranes are moist.     Pharynx: Oropharynx is clear.  Eyes:     General: Gaze aligned appropriately.     Extraocular Movements: Extraocular movements intact.     Conjunctiva/sclera: Conjunctivae normal.     Pupils: Pupils are equal, round, and reactive to light.     Comments: Visual fields grossly intact to left eye Reports totally blind in right eye x3 years  Neck:     Musculoskeletal: Full passive range of motion without pain, normal range of motion and neck supple. No neck rigidity.     Trachea: Trachea and phonation normal. No tracheal tenderness or tracheal deviation.     Meningeal: Brudzinski's sign and Kernig's sign absent.  Cardiovascular:     Rate and Rhythm: Normal rate and regular rhythm.     Pulses:          Dorsalis pedis pulses are 2+ on the right side and 2+ on the left side.     Heart sounds: Normal heart sounds.  Pulmonary:     Effort: Pulmonary effort is normal. No  tachypnea, accessory muscle usage or respiratory distress.     Breath sounds: Normal air entry. Examination of the right-middle field reveals rhonchi. Rhonchi present.  Chest:     Chest wall: No deformity, tenderness or crepitus.  Abdominal:     General: Bowel sounds are normal. There is no distension.     Palpations: Abdomen is soft.     Tenderness: There is no abdominal tenderness. There is no guarding or rebound.  Musculoskeletal:     Right lower leg: Normal.     Left lower leg: Normal.     Comments: No midline C/T/L spinal tenderness to palpation, no paraspinal muscle tenderness, no deformity, crepitus, or  step-off noted. No sign of injury to the neck or back.  Feet:     Right foot:     Protective Sensation: 5 sites tested. 5 sites sensed.     Left foot:     Protective Sensation: 5 sites tested. 5 sites sensed.  Skin:    General: Skin is warm and dry.  Neurological:     Mental Status: She is alert and oriented to person, place, and time.     GCS: GCS eye subscore is 4. GCS verbal subscore is 5. GCS motor subscore is 6.     Comments: Mental Status: Alert, oriented, thought content appropriate, able to give a coherent history. Speech fluent without evidence of aphasia. Able to follow 2 step commands without difficulty. Cranial Nerves: ZO:XWRUEAV reports blindness to the right eye x3 years, states that she is unable to see any light which is not new, pupils equal, round, reactive to light, visual fields grossly intact to left eye. III,IV, VI: ptosis not present, extra-ocular motions intact bilaterally V,VII: smile symmetric, eyebrows raise symmetric, facial light touch sensation equal VIII: hearing grossly normal to voice X: uvula elevates symmetrically XI: bilateral shoulder shrug symmetric and strong XII: midline tongue extension without fassiculations Motor: Normal tone. 5/5 strength in upper and lower extremities bilaterally including strong and equal grip strength and  dorsiflexion/plantar flexion Sensory: Sensation intact to light touch in all extremities.Negative Romberg.  Deep Tendon Reflexes: 2+ and symmetric in patella Cerebellar: normal finger-to-nose maze with bilateral upper extremities. Normal heel-to -shin balance bilaterally of the lower extremity. No pronator drift.  Gait: normal gait and balance CV: distal pulses palpable throughout  Psychiatric:        Mood and Affect: Mood normal.        Behavior: Behavior is cooperative.      ED Treatments / Results  Labs (all labs ordered are listed, but only abnormal results are displayed) Labs Reviewed  CBC WITH DIFFERENTIAL/PLATELET - Abnormal; Notable for the following components:      Result Value   WBC 11.8 (*)    Neutro Abs 9.7 (*)    Abs Immature Granulocytes 0.17 (*)    All other components within normal limits  COMPREHENSIVE METABOLIC PANEL - Abnormal; Notable for the following components:   Sodium 130 (*)    Potassium 3.2 (*)    CO2 15 (*)    Glucose, Bld 135 (*)    BUN 39 (*)    Creatinine, Ser 2.15 (*)    Calcium 8.4 (*)    Albumin 2.8 (*)    Total Bilirubin 0.2 (*)    GFR calc non Af Amer 24 (*)    GFR calc Af Amer 28 (*)    Anion gap 17 (*)    All other components within normal limits  URINALYSIS, ROUTINE W REFLEX MICROSCOPIC - Abnormal; Notable for the following components:   Color, Urine AMBER (*)    APPearance CLOUDY (*)    Hgb urine dipstick SMALL (*)    Protein, ur 100 (*)    Bacteria, UA FEW (*)    All other components within normal limits  LACTIC ACID, PLASMA - Abnormal; Notable for the following components:   Lactic Acid, Venous 2.5 (*)    All other components within normal limits  SEDIMENTATION RATE - Abnormal; Notable for the following components:   Sed Rate 108 (*)    All other components within normal limits  C-REACTIVE PROTEIN - Abnormal; Notable for the following components:   CRP  48.0 (*)    All other components within normal limits  CULTURE, BLOOD  (ROUTINE X 2)  CULTURE, BLOOD (ROUTINE X 2)  SARS CORONAVIRUS 2 (HOSPITAL ORDER, [place] [facility])  URINE CULTURE    EKG None  Radiology Dg Chest 2 View  Result Date: 05/01/2019 CLINICAL DATA:  Fever. EXAM: CHEST - 2 VIEW COMPARISON:  Radiograph May 28, 2016. FINDINGS: The heart size and mediastinal contours are within normal limits. No pneumothorax or pleural effusion is noted. Left lung is clear. Large rounded density is seen in the right lower lobe concerning for possible malignancy or pneumonia. The visualized skeletal structures are unremarkable. IMPRESSION: Large rounded density seen in right lower lobe concerning for pneumonia or malignancy. CT scan of the chest with intravenous contrast is recommended for further evaluation. Electronically Signed   By: Marijo Conception M.D.   On: 05/01/2019 19:31   Ct Head Wo Contrast  Result Date: 05/01/2019 CLINICAL DATA:  Headache EXAM: CT HEAD WITHOUT CONTRAST TECHNIQUE: Contiguous axial images were obtained from the base of the skull through the vertex without intravenous contrast. COMPARISON:  07/25/2006 FINDINGS: Brain: There is no mass, hemorrhage or extra-axial collection. The size and configuration of the ventricles and extra-axial CSF spaces are normal. There is hypoattenuation of the white matter, most commonly indicating chronic small vessel disease. Vascular: No abnormal hyperdensity of the major intracranial arteries or dural venous sinuses. No intracranial atherosclerosis. Skull: The visualized skull base, calvarium and extracranial soft tissues are normal. Sinuses/Orbits: No fluid levels or advanced mucosal thickening of the visualized paranasal sinuses. No mastoid or middle ear effusion. The orbits are normal. IMPRESSION: Mild chronic small vessel disease without acute intracranial abnormality. Electronically Signed   By: Ulyses Jarred M.D.   On: 05/01/2019 23:44   Ct Chest Wo Contrast  Result Date: 05/01/2019 CLINICAL  DATA:  Unresolved complicated pneumonia. EXAM: CT CHEST WITHOUT CONTRAST TECHNIQUE: Multidetector CT imaging of the chest was performed following the standard protocol without IV contrast. COMPARISON:  CT of the pelvis dated 01/25/2006 FINDINGS: Cardiovascular: There is an aberrant right subclavian artery, a normal variant. Aortic calcifications are noted. The heart size is normal. There are a few coronary artery calcifications. There is no significant pericardial effusion. Mediastinum/Nodes: There are prominent mediastinal mediastinal and hilar lymph nodes. No significant axillary adenopathy. No pathologically enlarged supraclavicular lymph nodes. Lungs/Pleura: There is consolidation involving approximately 50% of the right lower lobe. There is no pneumothorax. No large pleural effusion. Upper Abdomen: No acute abnormality. Musculoskeletal: There is a chronic appearing fracture involving the T12 vertebral body. There is no acute displaced fracture identified on today's exam. IMPRESSION: 1. Right lower lobe pneumonia. Follow-up to radiologic resolution is recommended. 2. Prominent mediastinal and hilar lymph nodes, presumably reactive. 3. Incidentally noted aberrant right subclavian artery, a normal variant. Aortic Atherosclerosis (ICD10-I70.0). Electronically Signed   By: Constance Holster M.D.   On: 05/01/2019 23:33    Procedures Procedures (including critical care time)  Medications Ordered in ED Medications  acetaminophen (TYLENOL) tablet 650 mg (650 mg Oral Given 05/01/19 1538)  sodium chloride 0.9 % bolus 1,000 mL (0 mLs Intravenous Stopped 05/01/19 2121)  cefTRIAXone (ROCEPHIN) 1 g in sodium chloride 0.9 % 100 mL IVPB (0 g Intravenous Stopped 05/01/19 2121)  azithromycin (ZITHROMAX) 500 mg in sodium chloride 0.9 % 250 mL IVPB (0 mg Intravenous Stopped 05/01/19 2240)     Initial Impression / Assessment and Plan / ED Course  I have reviewed the triage  vital signs and the nursing notes.  Pertinent  labs & imaging results that were available during my care of the patient were reviewed by me and considered in my medical decision making (see chart for details).  Clinical Course as of May 01 18  Tue May 01, 2019  2345 Discussed case with hospitalist Dr. Darrick Meigs will see patient for admission.   [BM]    Clinical Course User Index [BM] Deliah Boston, PA-C    CBC with leukocytosis of 11.8 CMP with AKI, 2.15 elevated BUN Lactic 2.5 COVID-19 negative Blood cultures pending ESR and CRP are elevated today however suspect this is secondary to her community-acquired pneumonia, based on examination and history of illness do not suspect temporal arteritis at this time, discussed with Dr. Ashok Cordia Urinalysis small hemoglobin, protein and few bacteria, patient without urinary symptoms do not suspect UTI  CT Head:  IMPRESSION:  Mild chronic small vessel disease without acute intracranial  abnormality.   DG Chest:  IMPRESSION:  Large rounded density seen in right lower lobe concerning for  pneumonia or malignancy. CT scan of the chest with intravenous  contrast is recommended for further evaluation.   CT Chest:  IMPRESSION:  1. Right lower lobe pneumonia. Follow-up to radiologic resolution is  recommended.  2. Prominent mediastinal and hilar lymph nodes, presumably reactive.  3. Incidentally noted aberrant right subclavian artery, a normal  variant.    Aortic Atherosclerosis (ICD10-I70.0).  - Fluid bolus given, azithromycin and Rocephin given.  Patient reevaluated resting comfortably no acute distress updated on plan of care, agreeable for admission, no new complaints or concerns. - Discussed case with Dr. Darrick Meigs from hospitalist service who will see patient for admission.   Note: Portions of this report may have been transcribed using voice recognition software. Every effort was made to ensure accuracy; however, inadvertent computerized transcription errors may still be  present. Final Clinical Impressions(s) / ED Diagnoses   Final diagnoses:  AKI (acute kidney injury) ([place])  Nonintractable headache, unspecified chronicity pattern, unspecified headache type  Community acquired pneumonia of right lower lobe of lung [facility])    ED Discharge Orders    None       Gari Crown 05/02/19 0019    Sherwood Gambler, MD 05/08/19 [phone]

## 2019-05-02 ENCOUNTER — Inpatient Hospital Stay (HOSPITAL_COMMUNITY): Payer: Self-pay

## 2019-05-02 DIAGNOSIS — N179 Acute kidney failure, unspecified: Secondary | ICD-10-CM

## 2019-05-02 DIAGNOSIS — J189 Pneumonia, unspecified organism: Secondary | ICD-10-CM | POA: Diagnosis present

## 2019-05-02 DIAGNOSIS — R519 Headache, unspecified: Secondary | ICD-10-CM

## 2019-05-02 DIAGNOSIS — J181 Lobar pneumonia, unspecified organism: Principal | ICD-10-CM

## 2019-05-02 LAB — COMPREHENSIVE METABOLIC PANEL
ALT: 22 U/L (ref 0–44)
AST: 30 U/L (ref 15–41)
Albumin: 2.6 g/dL — ABNORMAL LOW (ref 3.5–5.0)
Alkaline Phosphatase: 120 U/L (ref 38–126)
Anion gap: 15 (ref 5–15)
BUN: 34 mg/dL — ABNORMAL HIGH (ref 8–23)
CO2: 20 mmol/L — ABNORMAL LOW (ref 22–32)
Calcium: 8.3 mg/dL — ABNORMAL LOW (ref 8.9–10.3)
Chloride: 102 mmol/L (ref 98–111)
Creatinine, Ser: 1.6 mg/dL — ABNORMAL HIGH (ref 0.44–1.00)
GFR calc Af Amer: 39 mL/min — ABNORMAL LOW (ref >60)
GFR calc non Af Amer: 34 mL/min — ABNORMAL LOW (ref >60)
Glucose, Bld: 110 mg/dL — ABNORMAL HIGH (ref 70–99)
Potassium: 3.2 mmol/L — ABNORMAL LOW (ref 3.5–5.1)
Sodium: 137 mmol/L (ref 135–145)
Total Bilirubin: 0.3 mg/dL (ref 0.3–1.2)
Total Protein: 6.6 g/dL (ref 6.5–8.1)

## 2019-05-02 LAB — STREP PNEUMONIAE URINARY ANTIGEN: Strep Pneumo Urinary Antigen: POSITIVE — AB

## 2019-05-02 LAB — CBC
HCT: 39.1 % (ref 36.0–46.0)
Hemoglobin: 12.9 g/dL (ref 12.0–15.0)
MCH: 31.9 pg (ref 26.0–34.0)
MCHC: 33 g/dL (ref 30.0–36.0)
MCV: 96.8 fL (ref 80.0–100.0)
Platelets: 346 10*3/uL (ref 150–400)
RBC: 4.04 MIL/uL (ref 3.87–5.11)
RDW: 13.1 % (ref 11.5–15.5)
WBC: 12 10*3/uL — ABNORMAL HIGH (ref 4.0–10.5)
nRBC: 0 % (ref 0.0–0.2)

## 2019-05-02 LAB — SODIUM, URINE, RANDOM: Sodium, Ur: 17 mmol/L

## 2019-05-02 LAB — CREATININE, URINE, RANDOM: Creatinine, Urine: 125.01 mg/dL

## 2019-05-02 MED ORDER — METHOCARBAMOL 500 MG PO TABS: 500.0000 mg | ORAL_TABLET | Freq: Four times a day (QID) | ORAL | Status: DC | PRN

## 2019-05-02 MED ORDER — OXYCODONE-ACETAMINOPHEN 5-325 MG PO TABS
1.0000 | ORAL_TABLET | ORAL | Status: DC | PRN
  Administered 2019-05-03: 1 via ORAL
  Filled 2019-05-02: qty 1

## 2019-05-02 MED ORDER — PANTOPRAZOLE SODIUM 40 MG PO TBEC
40.0000 mg | DELAYED_RELEASE_TABLET | Freq: Every day | ORAL | Status: DC
  Administered 2019-05-02 – 2019-05-04 (×3): 40 mg via ORAL
  Filled 2019-05-02 (×3): qty 1

## 2019-05-02 MED ORDER — ONDANSETRON HCL 4 MG/2ML IJ SOLN: 4.0000 mg | Freq: Four times a day (QID) | INTRAMUSCULAR | Status: DC | PRN

## 2019-05-02 MED ORDER — ASPIRIN EC 325 MG PO TBEC
325.0000 mg | DELAYED_RELEASE_TABLET | Freq: Two times a day (BID) | ORAL | Status: DC
  Administered 2019-05-02 – 2019-05-04 (×5): 325 mg via ORAL
  Filled 2019-05-02 (×5): qty 1

## 2019-05-02 MED ORDER — POTASSIUM CHLORIDE CRYS ER 20 MEQ PO TBCR
40.0000 meq | EXTENDED_RELEASE_TABLET | Freq: Once | ORAL | Status: AC
  Administered 2019-05-02: 40 meq via ORAL
  Filled 2019-05-02: qty 2

## 2019-05-02 MED ORDER — ENOXAPARIN SODIUM 40 MG/0.4ML ~~LOC~~ SOLN
40.0000 mg | SUBCUTANEOUS | Status: DC
  Administered 2019-05-02 – 2019-05-04 (×3): 40 mg via SUBCUTANEOUS
  Filled 2019-05-02 (×3): qty 0.4

## 2019-05-02 MED ORDER — SODIUM CHLORIDE 0.9 % IV SOLN
500.0000 mg | INTRAVENOUS | Status: DC
  Administered 2019-05-02 – 2019-05-03 (×2): 500 mg via INTRAVENOUS
  Filled 2019-05-02 (×4): qty 500

## 2019-05-02 MED ORDER — SODIUM CHLORIDE 0.9 % IV SOLN
1.0000 g | INTRAVENOUS | Status: DC
  Administered 2019-05-02 – 2019-05-03 (×2): 1 g via INTRAVENOUS
  Filled 2019-05-02 (×2): qty 10

## 2019-05-02 MED ORDER — SODIUM CHLORIDE 0.9 % IV SOLN
INTRAVENOUS | Status: DC
  Administered 2019-05-02 – 2019-05-03 (×3): via INTRAVENOUS
  Administered 2019-05-04: 75 mL/h via INTRAVENOUS

## 2019-05-02 MED ORDER — ONDANSETRON HCL 4 MG PO TABS: 4.0000 mg | ORAL_TABLET | Freq: Four times a day (QID) | ORAL | Status: DC | PRN

## 2019-05-02 NOTE — Consult Note (Signed)
[place] A. Rebecca Laughter, MD     [email or web address]          [name] is an 63 y.o. female.   ASSESSMENT/PLAN: 1.  Unusual right frontotemporal headaches with the characteristics suggestive of temporal arteritis: The patient will be set up to have biopsy of the right temporal artery.  Steroids will be initiated after the biopsy is done.  Given the negative evaluation on examination for focal deficits and CT scan, intracranial etiology seems unlikely.  Consider carotid duplex Doppler and agree with continuation of aspirin.   The patient is a 63 year old right-handed white female who presents with about a 4-year history of right temporal headaches.  She reports that the headache started after she lost her vision from cataract at that time.  It appears that the visual loss happened relatively abruptly.  From what I can ascertain from the patient's history is that the headaches were more episodic and a moderate.  She is somewhat vague as far as a history is concerned.  What I can ascertain however is that over the last 3 months the headaches have become quite severe and associated with significant allodynia and tingling.  She reports interestingly that today the soreness on the right seems improved since being admitted to the hospital.  She has not gotten any steroids as far as I can tell.  She denies focal associated symptoms.  She denies a baseline history of episodic headaches when she was younger.  There is no history of migraine headaches.  She denies associated nausea, vomiting, photophobia and phonophobia.  The review of systems is otherwise negative.    GENERAL: This a pleasant mildly overweight female in no acute distress.  HEENT: There is significant soreness to palpation involving the right temple region.  No trauma is appreciated and neck is supple.  There is a rounded dense cataract on the right.  Pupils however are equal round and reactive to light and  accommodation.  ABDOMEN: soft  EXTREMITIES: No edema   BACK: This is normal.  SKIN: Normal by inspection.    MENTAL STATUS: Alert and oriented. Speech, language and cognition are generally intact. Judgment and insight normal.   CRANIAL NERVES: The extra ocular movements are full, there is no significant nystagmus; visual fields are full; upper and lower facial muscles are normal in strength and symmetric, there is no flattening of the nasolabial folds; tongue is midline; uvula is midline; shoulder elevation is normal.  MOTOR: Normal tone, bulk and strength; no pronator drift.  COORDINATION: Left finger to nose is normal, right finger to nose is normal, No rest tremor; no intention tremor; no postural tremor; no bradykinesia.  REFLEXES: Deep tendon reflexes are symmetrical and normal. Plantar reflexes are flexor bilaterally.   SENSATION: Normal to light touch, temperature, and pain.  GAIT: Normal.    Blood pressure 133/70, pulse (!) 101, temperature 100.1 F (37.8 C), temperature source [place], resp. rate 16, height 5' 5.5" (1.664 m), weight 79.4 kg, SpO2 93 %.  Past Medical History:  Diagnosis Date   Coronary artery disease    Endometriosis    Hyperlipidemia    Hypotension    Ruptured cervical disc    Skin cancer    Tobacco abuse     Past Surgical History:  Procedure Laterality Date   ABDOMINAL HYSTERECTOMY     ANTERIOR APPROACH HEMI HIP ARTHROPLASTY Left 05/29/2016   Procedure: ANTERIOR APPROACH TOTAL HIP ARTHROPLASTY ;  Surgeon: Mcarthur Rossetti, MD;  Location: [facility];  Service: Orthopedics;  Laterality: Left;   CARDIAC CATHETERIZATION     CHOLECYSTECTOMY     TONSILLECTOMY      No family history on file.  Social History:  reports that she has quit smoking. She smoked 1.00 pack per day. She has never used smokeless tobacco. She reports previous alcohol use. She reports previous drug use.  Allergies:  Allergies  Allergen Reactions   Cortisone      REACTION: Fluid retention and hives    Medications: Prior to Admission medications   Medication Sig Start Date End Date Taking? Authorizing Provider  aspirin EC 325 MG EC tablet Take 1 tablet (325 mg total) by mouth 2 (two) times daily after a meal. 05/30/16   Mcarthur Rossetti, MD  methocarbamol (ROBAXIN) 500 MG tablet Take 1 tablet (500 mg total) by mouth every 6 (six) hours as needed for muscle spasms. 05/30/16   Mcarthur Rossetti, MD  oxyCODONE-acetaminophen (ROXICET) 5-325 MG tablet Take 1-2 tablets by mouth every 4 (four) hours as needed for severe pain. 05/30/16   Mcarthur Rossetti, MD  pantoprazole (PROTONIX) 40 MG tablet Take 1 tablet (40 mg total) by mouth daily. 05/31/16   Caren Griffins, MD    Scheduled Meds:  aspirin  325 mg Oral BID PC   enoxaparin (LOVENOX) injection  40 mg Subcutaneous Q24H   pantoprazole  40 mg Oral Daily   Continuous Infusions:  sodium chloride Stopped (05/02/19 0500)   azithromycin     cefTRIAXone (ROCEPHIN)  IV     PRN Meds:.methocarbamol, ondansetron **OR** ondansetron (ZOFRAN) IV, oxyCODONE-acetaminophen     Results for orders placed or performed during the hospital encounter of 05/01/19 (from the past 48 hour(s))  Urinalysis, Routine w reflex microscopic     Status: Abnormal   Collection Time: 05/01/19  3:34 PM  Result Value Ref Range   Color, Urine AMBER (A) YELLOW    Comment: BIOCHEMICALS MAY BE AFFECTED BY COLOR   APPearance CLOUDY (A) CLEAR   Specific Gravity, Urine 1.021 1.005 - 1.030   pH 5.0 5.0 - 8.0   Glucose, UA NEGATIVE NEGATIVE mg/dL   Hgb urine dipstick SMALL (A) NEGATIVE   Bilirubin Urine NEGATIVE NEGATIVE   Ketones, ur NEGATIVE NEGATIVE mg/dL   Protein, ur 100 (A) NEGATIVE mg/dL   Nitrite NEGATIVE NEGATIVE   Leukocytes,Ua NEGATIVE NEGATIVE   RBC / HPF 6-10 0 - 5 RBC/hpf   WBC, UA 6-10 0 - 5 WBC/hpf   Bacteria, UA FEW (A) NONE SEEN   Squamous Epithelial / LPF 0-5 0 - 5   Mucus PRESENT    Granular  Casts, UA PRESENT    Amorphous Crystal PRESENT     Comment: Performed at [facility], [address] [place], [place] [address]  CBC with Differential     Status: Abnormal   Collection Time: 05/01/19  6:33 PM  Result Value Ref Range   WBC 11.8 (H) 4.0 - 10.5 K/uL   RBC 4.06 3.87 - 5.11 MIL/uL   Hemoglobin 13.3 12.0 - 15.0 g/dL   HCT 38.3 36.0 - 46.0 %   MCV 94.3 80.0 - 100.0 fL   MCH 32.8 26.0 - 34.0 pg   MCHC 34.7 30.0 - 36.0 g/dL   RDW 13.2 11.5 - 15.5 %   Platelets 313 150 - 400 K/uL   nRBC 0.0 0.0 - 0.2 %   Neutrophils Relative % 82 %   Neutro Abs 9.7 (H) 1.7 - 7.7 K/uL   Lymphocytes Relative 11 %  Lymphs Abs 1.3 0.7 - 4.0 K/uL   Monocytes Relative 5 %   Monocytes Absolute 0.6 0.1 - 1.0 K/uL   Eosinophils Relative 0 %   Eosinophils Absolute 0.0 0.0 - 0.5 K/uL   Basophils Relative 1 %   Basophils Absolute 0.1 0.0 - 0.1 K/uL   WBC Morphology TOXIC GRANULATION    Immature Granulocytes 1 %   Abs Immature Granulocytes 0.17 (H) 0.00 - 0.07 K/uL    Comment: Performed at [facility], [address], [place], [place] [address]  Comprehensive metabolic panel     Status: Abnormal   Collection Time: 05/01/19  6:33 PM  Result Value Ref Range   Sodium 130 (L) 135 - 145 mmol/L   Potassium 3.2 (L) 3.5 - 5.1 mmol/L   Chloride 98 98 - 111 mmol/L   CO2 15 (L) 22 - 32 mmol/L   Glucose, Bld 135 (H) 70 - 99 mg/dL   BUN 39 (H) 8 - 23 mg/dL   Creatinine, Ser 2.15 (H) 0.44 - 1.00 mg/dL   Calcium 8.4 (L) 8.9 - 10.3 mg/dL   Total Protein 7.0 6.5 - 8.1 g/dL   Albumin 2.8 (L) 3.5 - 5.0 g/dL   AST 33 15 - 41 U/L   ALT 24 0 - 44 U/L   Alkaline Phosphatase 120 38 - 126 U/L   Total Bilirubin 0.2 (L) 0.3 - 1.2 mg/dL   GFR calc non Af Amer 24 (L) >60 mL/min   GFR calc Af Amer 28 (L) >60 mL/min   Anion gap 17 (H) 5 - 15    Comment: Performed at [facility], [address] [place], [place] [address]  Sedimentation rate     Status: Abnormal   Collection Time: 05/01/19  6:33 PM  Result Value Ref  Range   Sed Rate 108 (H) 0 - 22 mm/hr    Comment: Performed at [facility], [address] [place], [place] [address]  C-reactive protein     Status: Abnormal   Collection Time: 05/01/19  6:33 PM  Result Value Ref Range   CRP 48.0 (H) <1.0 mg/dL    Comment: RESULTS CONFIRMED BY MANUAL DILUTION Performed at [facility], [address], [place], [place] [address]   Lactic acid, plasma     Status: Abnormal   Collection Time: 05/01/19  6:36 PM  Result Value Ref Range   Lactic Acid, Venous 2.5 (HH) 0.5 - 1.9 mmol/L    Comment: CRITICAL RESULT CALLED TO, READ BACK BY AND VERIFIED WITH: WALKER,T ON 05/01/19 AT 1940 BY LOY,C Performed at [facility], [address], [place], [place] [address]   Blood culture (routine x 2)     Status: None (Preliminary result)   Collection Time: 05/01/19  8:30 PM  Result Value Ref Range   Specimen Description BLOOD RIGHT ARM    Special Requests      BOTTLES DRAWN AEROBIC AND ANAEROBIC Blood Culture adequate volume   Culture      NO GROWTH < 12 HOURS Performed at [facility], [address] [place],  [address]    Report Status PENDING   Blood culture (routine x 2)     Status: None (Preliminary result)   Collection Time: 05/01/19  8:31 PM  Result Value Ref Range   Specimen Description BLOOD LEFT ARM    Special Requests      BOTTLES DRAWN AEROBIC AND ANAEROBIC Blood Culture adequate volume   Culture      NO GROWTH < 12 HOURS  Performed at [facility], [address] [place], [place] [address]    Report Status PENDING   SARS Coronavirus 2 (CEPHEID- Performed in [place] hospital lab), Hosp Order     Status: None   Collection Time: 05/01/19  8:36 PM  Result Value Ref Range   SARS Coronavirus 2 NEGATIVE NEGATIVE    Comment: (NOTE) If result is NEGATIVE SARS-CoV-2 target nucleic acids are NOT DETECTED. The SARS-CoV-2 RNA is generally detectable in upper and lower  respiratory specimens during the acute phase of infection. The lowest   concentration of SARS-CoV-2 viral copies this assay can detect is 250  copies / mL. A negative result does not preclude SARS-CoV-2 infection  and should not be used as the sole basis for treatment or other  patient management decisions.  A negative result may occur with  improper specimen collection / handling, submission of specimen other  than nasopharyngeal swab, presence of viral mutation(s) within the  areas targeted by this assay, and inadequate number of viral copies  (<250 copies / mL). A negative result must be combined with clinical  observations, patient history, and epidemiological information. If result is POSITIVE SARS-CoV-2 target nucleic acids are DETECTED. The SARS-CoV-2 RNA is generally detectable in upper and lower  respiratory specimens dur ing the acute phase of infection.  Positive  results are indicative of active infection with SARS-CoV-2.  Clinical  correlation with patient history and other diagnostic information is  necessary to determine patient infection status.  Positive results do  not rule out bacterial infection or co-infection with other viruses. If result is PRESUMPTIVE POSTIVE SARS-CoV-2 nucleic acids MAY BE PRESENT.   A presumptive positive result was obtained on the submitted specimen  and confirmed on repeat testing.  While 2019 novel coronavirus  (SARS-CoV-2) nucleic acids may be present in the submitted sample  additional confirmatory testing may be necessary for epidemiological  and / or clinical management purposes  to differentiate between  SARS-CoV-2 and other Sarbecovirus currently known to infect humans.  If clinically indicated additional testing with an alternate test  methodology [phone]) is advised. The SARS-CoV-2 RNA is generally  detectable in upper and lower respiratory sp ecimens during the acute  phase of infection. The expected result is Negative. Fact Sheet for Patients:  StrictlyIdeas.no Fact Sheet  for Healthcare Providers: BankingDealers.co.za This test is not yet approved or cleared by the [place] FDA and has been authorized for detection and/or diagnosis of SARS-CoV-2 by FDA under an Emergency Use Authorization (EUA).  This EUA will remain in effect (meaning this test can be used) for the duration of the COVID-19 declaration under Section 564(b)(1) of the Act, 21 U.S.C. section 360bbb-3(b)(1), unless the authorization is terminated or revoked sooner. Performed at [facility], [address] [place], [place] [address]   CBC     Status: Abnormal   Collection Time: 05/02/19  5:09 AM  Result Value Ref Range   WBC 12.0 (H) 4.0 - 10.5 K/uL   RBC 4.04 3.87 - 5.11 MIL/uL   Hemoglobin 12.9 12.0 - 15.0 g/dL   HCT 39.1 36.0 - 46.0 %   MCV 96.8 80.0 - 100.0 fL   MCH 31.9 26.0 - 34.0 pg   MCHC 33.0 30.0 - 36.0 g/dL   RDW 13.1 11.5 - 15.5 %   Platelets 346 150 - 400 K/uL   nRBC 0.0 0.0 - 0.2 %    Comment: Performed at [facility], [address], [place], [place] [address]  Comprehensive  metabolic panel     Status: Abnormal   Collection Time: 05/02/19  5:09 AM  Result Value Ref Range   Sodium 137 135 - 145 mmol/L    Comment: DELTA CHECK NOTED   Potassium 3.2 (L) 3.5 - 5.1 mmol/L   Chloride 102 98 - 111 mmol/L   CO2 20 (L) 22 - 32 mmol/L   Glucose, Bld 110 (H) 70 - 99 mg/dL   BUN 34 (H) 8 - 23 mg/dL   Creatinine, Ser 1.60 (H) 0.44 - 1.00 mg/dL   Calcium 8.3 (L) 8.9 - 10.3 mg/dL   Total Protein 6.6 6.5 - 8.1 g/dL   Albumin 2.6 (L) 3.5 - 5.0 g/dL   AST 30 15 - 41 U/L   ALT 22 0 - 44 U/L   Alkaline Phosphatase 120 38 - 126 U/L   Total Bilirubin 0.3 0.3 - 1.2 mg/dL   GFR calc non Af Amer 34 (L) >60 mL/min   GFR calc Af Amer 39 (L) >60 mL/min   Anion gap 15 5 - 15    Comment: Performed at [facility], [address], [place], [place] [address]    Studies/Results:  HEAD CT FINDINGS: Brain: There is no mass, hemorrhage or extra-axial collection.  The size and configuration of the ventricles and extra-axial CSF spaces are normal. There is hypoattenuation of the white matter, most commonly indicating chronic small vessel disease.  Vascular: No abnormal hyperdensity of the major intracranial arteries or dural venous sinuses. No intracranial atherosclerosis.  Skull: The visualized skull base, calvarium and extracranial soft tissues are normal.  Sinuses/Orbits: No fluid levels or advanced mucosal thickening of the visualized paranasal sinuses. No mastoid or middle ear effusion. The orbits are normal.  IMPRESSION: Mild chronic small vessel disease without acute intracranial Abnormality.    CHEST CT FINDINGS: Cardiovascular: There is an aberrant right subclavian artery, a normal variant. Aortic calcifications are noted. The heart size is normal. There are a few coronary artery calcifications. There is no significant pericardial effusion.  Mediastinum/Nodes: There are prominent mediastinal mediastinal and hilar lymph nodes. No significant axillary adenopathy. No pathologically enlarged supraclavicular lymph nodes.  Lungs/Pleura: There is consolidation involving approximately 50% of the right lower lobe. There is no pneumothorax. No large pleural effusion.  Upper Abdomen: No acute abnormality.  Musculoskeletal: There is a chronic appearing fracture involving the T12 vertebral body. There is no acute displaced fracture identified on today's exam.  IMPRESSION: 1. Right lower lobe pneumonia. Follow-up to radiologic resolution is recommended. 2. Prominent mediastinal and hilar lymph nodes, presumably reactive. 3. Incidentally noted aberrant right subclavian artery, a normal variant.    Cataldo Cosgriff A. [name], M.D.  Diplomate, Tax adviser of Psychiatry and Neurology ( Neurology). 05/02/2019, 8:15 AM

## 2019-05-02 NOTE — Progress Notes (Signed)
PROGRESS NOTE  OLINA MELFI ENM:[id number] DOB: Mar 17, 1956 DOA: 05/01/2019 PCP: Patient, No Pcp Per  Brief History:  63 year old female with a history of coronary disease, hyperlipidemia, tobacco abuse presenting with a chief complaint of right forehead headache.  The patient states that she has had this headache for almost 4 years.  The patient is a difficult historian, but from what is gathered from the clinical history it appears that the headache has been episodic waxing and waning over the last 4 years.  However in the last 2 to 3 months, she has noted increasing frequency.  She describes some allodynia to the right frontoparietal area with intermittent sharp pains.  The patient has been taking acetaminophen for pain.  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  She has been attributing her blindness to cataracts.  She stated that she saw either an optometrist or ophthalmologist 4 years ago, but cannot clarify any additional work-up other than the fact that she had a cataract.  On time of admission, the patient was noted to have a fever 101.2 F.  As result, chest x-ray was obtained and showed right lower lobe opacity.  CT of the chest subsequently showed consolidation in the right lower lobe with mediastinal lymphadenopathy.  The patient was started on Rocephin and azithromycin.  Assessment/Plan: Lobar pneumonia -Check procalcitonin -Continue ceftriaxone and azithromycin -Repeat chest x-ray in 1 month  Right frontal parietal headache -Check ESR--108 -CRP--48.0 -Cannot rule out temporal arteritis -Consult general surgery for biopsy  Acute kidney injury -Baseline creatinine 0.7-0.9 -Serum creatinine peaked 2.5 -Continue IV fluids  Hypokalemia -Replete -Check magnesium  Tobacco Abuse -I have discussed tobacco cessation with the patient.  I have counseled the patient regarding the negative impacts of  continued tobacco use including but not limited to lung cancer, COPD, and cardiovascular disease.  I have discussed alternatives to tobacco and modalities that may help facilitate tobacco cessation including but not limited to biofeedback, hypnosis, and medications.  Total time spent with tobacco counseling was 4 minutes.  Coronary Artery Disease -no chest pain -continue ASA     Disposition Plan:   Home in 1-2 days  Family Communication:   Family at bedside  Consultants:  General surgery  Code Status:  FULL  DVT Prophylaxis:  [place] Lovenox   Procedures: As Listed in Progress Note Above  Antibiotics: None       Subjective: Patient continues to complain of moderate intermittent right frontal parietal pain.  She denies any fevers, chills, chest pain, shortness breath, coughing, hemoptysis, nausea, vomiting, diarrhea, abdominal pain.  There is no dysuria or hematuria.  Objective: Vitals:   05/02/19 0330 05/02/19 0400 05/02/19 0439 05/02/19 0442  BP: (!) 105/56 (!) 108/56  133/70  Pulse: 83 79  (!) 101  Resp: (!) 27 (!) 24  16  Temp:   (!) 100.9 F (38.3 C) 100.1 F (37.8 C)  TempSrc:   Oral Oral  SpO2: 97% 99%  93%  Weight:      Height:        Intake/Output Summary (Last 24 hours) at 05/02/2019 1556 Last data filed at 05/02/2019 1300 Gross per 24 hour  Intake 240.64 ml  Output -  Net 240.64 ml   Weight change:  Exam:   General:  Pt is alert, follows commands appropriately, not in acute distress  HEENT: No icterus, No thrush, No neck mass,  [place]/AT  Cardiovascular: RRR, S1/S2, no rubs, no gallops  Respiratory: Diminished breath sounds bilateral.  Bibasilar crackles.  No wheezing.  Abdomen: Soft/+BS, non tender, non distended, no guarding  Extremities: No edema, No lymphangitis, No petechiae, No rashes, no synovitis   Data Reviewed: I have personally reviewed following labs and imaging studies Basic Metabolic Panel: Recent Labs  Lab 05/01/19 1833 05/02/19  0509  NA 130* 137  K 3.2* 3.2*  CL 98 102  CO2 15* 20*  GLUCOSE 135* 110*  BUN 39* 34*  CREATININE 2.15* 1.60*  CALCIUM 8.4* 8.3*   Liver Function Tests: Recent Labs  Lab 05/01/19 1833 05/02/19 0509  AST 33 30  ALT 24 22  ALKPHOS 120 120  BILITOT 0.2* 0.3  PROT 7.0 6.6  ALBUMIN 2.8* 2.6*   No results for input(s): LIPASE, AMYLASE in the last 168 hours. No results for input(s): AMMONIA in the last 168 hours. Coagulation Profile: No results for input(s): INR, PROTIME in the last 168 hours. CBC: Recent Labs  Lab 05/01/19 1833 05/02/19 0509  WBC 11.8* 12.0*  NEUTROABS 9.7*  --   HGB 13.3 12.9  HCT 38.3 39.1  MCV 94.3 96.8  PLT 313 346   Cardiac Enzymes: No results for input(s): CKTOTAL, CKMB, CKMBINDEX, TROPONINI in the last 168 hours. BNP: Invalid input(s): POCBNP CBG: No results for input(s): GLUCAP in the last 168 hours. HbA1C: No results for input(s): HGBA1C in the last 72 hours. Urine analysis:    Component Value Date/Time   COLORURINE AMBER (A) 05/01/2019 1534   APPEARANCEUR CLOUDY (A) 05/01/2019 1534   LABSPEC 1.021 05/01/2019 1534   PHURINE 5.0 05/01/2019 1534   GLUCOSEU NEGATIVE 05/01/2019 1534   HGBUR SMALL (A) 05/01/2019 1534   BILIRUBINUR NEGATIVE 05/01/2019 1534   KETONESUR NEGATIVE 05/01/2019 1534   PROTEINUR 100 (A) 05/01/2019 1534   NITRITE NEGATIVE 05/01/2019 1534   LEUKOCYTESUR NEGATIVE 05/01/2019 1534   Sepsis Labs: @LABRCNTIP (procalcitonin:4,lacticidven:4) ) Recent Results (from the past 240 hour(s))  Blood culture (routine x 2)     Status: None (Preliminary result)   Collection Time: 05/01/19  8:30 PM  Result Value Ref Range Status   Specimen Description BLOOD RIGHT ARM  Final   Special Requests   Final    BOTTLES DRAWN AEROBIC AND ANAEROBIC Blood Culture adequate volume   Culture   Final    NO GROWTH < 12 HOURS Performed at [facility], [address], [place], [place] [address]    Report Status PENDING  Incomplete  Blood  culture (routine x 2)     Status: None (Preliminary result)   Collection Time: 05/01/19  8:31 PM  Result Value Ref Range Status   Specimen Description BLOOD LEFT ARM  Final   Special Requests   Final    BOTTLES DRAWN AEROBIC AND ANAEROBIC Blood Culture adequate volume   Culture   Final    NO GROWTH < 12 HOURS Performed at [facility], [address], [place], [place] [address]    Report Status PENDING  Incomplete  SARS Coronavirus 2 (CEPHEID- Performed in [place] hospital lab), Hosp Order     Status: None   Collection Time: 05/01/19  8:36 PM  Result Value Ref Range Status   SARS Coronavirus 2 NEGATIVE NEGATIVE Final    Comment: (NOTE) If result is NEGATIVE SARS-CoV-2 target nucleic acids are NOT DETECTED. The SARS-CoV-2 RNA is generally detectable in upper and lower  respiratory specimens during the acute phase of infection. The lowest  concentration  of SARS-CoV-2 viral copies this assay can detect is 250  copies / mL. A negative result does not preclude SARS-CoV-2 infection  and should not be used as the sole basis for treatment or other  patient management decisions.  A negative result may occur with  improper specimen collection / handling, submission of specimen other  than nasopharyngeal swab, presence of viral mutation(s) within the  areas targeted by this assay, and inadequate number of viral copies  (<250 copies / mL). A negative result must be combined with clinical  observations, patient history, and epidemiological information. If result is POSITIVE SARS-CoV-2 target nucleic acids are DETECTED. The SARS-CoV-2 RNA is generally detectable in upper and lower  respiratory specimens dur ing the acute phase of infection.  Positive  results are indicative of active infection with SARS-CoV-2.  Clinical  correlation with patient history and other diagnostic information is  necessary to determine patient infection status.  Positive results do  not rule out bacterial  infection or co-infection with other viruses. If result is PRESUMPTIVE POSTIVE SARS-CoV-2 nucleic acids MAY BE PRESENT.   A presumptive positive result was obtained on the submitted specimen  and confirmed on repeat testing.  While 2019 novel coronavirus  (SARS-CoV-2) nucleic acids may be present in the submitted sample  additional confirmatory testing may be necessary for epidemiological  and / or clinical management purposes  to differentiate between  SARS-CoV-2 and other Sarbecovirus currently known to infect humans.  If clinically indicated additional testing with an alternate test  methodology [phone]) is advised. The SARS-CoV-2 RNA is generally  detectable in upper and lower respiratory sp ecimens during the acute  phase of infection. The expected result is Negative. Fact Sheet for Patients:  StrictlyIdeas.no Fact Sheet for Healthcare Providers: BankingDealers.co.za This test is not yet approved or cleared by the [place] FDA and has been authorized for detection and/or diagnosis of SARS-CoV-2 by FDA under an Emergency Use Authorization (EUA).  This EUA will remain in effect (meaning this test can be used) for the duration of the COVID-19 declaration under Section 564(b)(1) of the Act, 21 U.S.C. section 360bbb-3(b)(1), unless the authorization is terminated or revoked sooner. Performed at [facility], [address] [place], [place] [address]      Scheduled Meds: . aspirin  325 mg Oral BID PC  . enoxaparin (LOVENOX) injection  40 mg Subcutaneous Q24H  . pantoprazole  40 mg Oral Daily   Continuous Infusions: . sodium chloride Stopped (05/02/19 0500)  . azithromycin    . cefTRIAXone (ROCEPHIN)  IV      Procedures/Studies: Dg Chest 2 View  Result Date: 05/01/2019 CLINICAL DATA:  Fever. EXAM: CHEST - 2 VIEW COMPARISON:  Radiograph May 28, 2016. FINDINGS: The heart size and mediastinal contours are within normal limits.  No pneumothorax or pleural effusion is noted. Left lung is clear. Large rounded density is seen in the right lower lobe concerning for possible malignancy or pneumonia. The visualized skeletal structures are unremarkable. IMPRESSION: Large rounded density seen in right lower lobe concerning for pneumonia or malignancy. CT scan of the chest with intravenous contrast is recommended for further evaluation. Electronically Signed   By: Marijo Conception M.D.   On: 05/01/2019 19:31   Ct Head Wo Contrast  Result Date: 05/01/2019 CLINICAL DATA:  Headache EXAM: CT HEAD WITHOUT CONTRAST TECHNIQUE: Contiguous axial images were obtained from the base of the skull through the vertex without intravenous contrast. COMPARISON:  07/25/2006 FINDINGS: Brain: There is no mass, hemorrhage  or extra-axial collection. The size and configuration of the ventricles and extra-axial CSF spaces are normal. There is hypoattenuation of the white matter, most commonly indicating chronic small vessel disease. Vascular: No abnormal hyperdensity of the major intracranial arteries or dural venous sinuses. No intracranial atherosclerosis. Skull: The visualized skull base, calvarium and extracranial soft tissues are normal. Sinuses/Orbits: No fluid levels or advanced mucosal thickening of the visualized paranasal sinuses. No mastoid or middle ear effusion. The orbits are normal. IMPRESSION: Mild chronic small vessel disease without acute intracranial abnormality. Electronically Signed   By: Ulyses Jarred M.D.   On: 05/01/2019 23:44   Ct Chest Wo Contrast  Result Date: 05/01/2019 CLINICAL DATA:  Unresolved complicated pneumonia. EXAM: CT CHEST WITHOUT CONTRAST TECHNIQUE: Multidetector CT imaging of the chest was performed following the standard protocol without IV contrast. COMPARISON:  CT of the pelvis dated 01/25/2006 FINDINGS: Cardiovascular: There is an aberrant right subclavian artery, a normal variant. Aortic calcifications are noted. The heart  size is normal. There are a few coronary artery calcifications. There is no significant pericardial effusion. Mediastinum/Nodes: There are prominent mediastinal mediastinal and hilar lymph nodes. No significant axillary adenopathy. No pathologically enlarged supraclavicular lymph nodes. Lungs/Pleura: There is consolidation involving approximately 50% of the right lower lobe. There is no pneumothorax. No large pleural effusion. Upper Abdomen: No acute abnormality. Musculoskeletal: There is a chronic appearing fracture involving the T12 vertebral body. There is no acute displaced fracture identified on today's exam. IMPRESSION: 1. Right lower lobe pneumonia. Follow-up to radiologic resolution is recommended. 2. Prominent mediastinal and hilar lymph nodes, presumably reactive. 3. Incidentally noted aberrant right subclavian artery, a normal variant. Aortic Atherosclerosis (ICD10-I70.0). Electronically Signed   By: Constance Holster M.D.   On: 05/01/2019 23:33   US Carotid Bilateral  Result Date: 05/02/2019 CLINICAL DATA:  63 year old female with periorbital headache EXAM: BILATERAL CAROTID DUPLEX ULTRASOUND TECHNIQUE: Pearline Cables scale imaging, color Doppler and duplex ultrasound were performed of bilateral carotid and vertebral arteries in the neck. COMPARISON:  None. FINDINGS: Criteria: Quantification of carotid stenosis is based on velocity parameters that correlate the residual internal carotid diameter with NASCET-based stenosis levels, using the diameter of the distal internal carotid lumen as the denominator for stenosis measurement. The following velocity measurements were obtained: RIGHT ICA: 114/26 cm/sec CCA: 725/36 cm/sec SYSTOLIC ICA/CCA RATIO:  1.1 ECA:  107 cm/sec LEFT ICA: 116/29 cm/sec CCA: 644/03 cm/sec SYSTOLIC ICA/CCA RATIO:  1.1 ECA:  113 cm/sec RIGHT CAROTID ARTERY: Mild heterogeneous atherosclerotic plaque in the proximal internal carotid artery. By peak systolic velocity criteria, the estimated  stenosis remains less than 50%. RIGHT VERTEBRAL ARTERY:  Patent with normal antegrade flow. LEFT CAROTID ARTERY: Mild smooth heterogeneous atherosclerotic plaque in the proximal and mid internal carotid artery. By peak systolic velocity criteria, the estimated stenosis remains less than 50%. LEFT VERTEBRAL ARTERY:  Patent with normal antegrade flow. IMPRESSION: 1. Mild (1-49%) stenosis proximal right internal carotid artery secondary to heterogenous atherosclerotic plaque. 2. Mild (1-49%) stenosis proximal left internal carotid artery secondary to heterogenous atherosclerotic plaque. 3. The vertebral arteries are patent with normal antegrade flow. Signed, Criselda Peaches, MD, [facility] Electronically Signed   By: Jacqulynn Cadet M.D.   On: 05/02/2019 15:04    Orson Eva, DO  [facility] Pager [phone]  If 7PM-7AM, please contact night-coverage [email or web address] Password [email or web address] 05/02/2019, 3:56 PM   LOS: 0 days

## 2019-05-02 NOTE — H&P (Addendum)
TRH H&P    Patient Demographics:    Tabia Landowski, is a 63 y.o. female  MRN: [id number]  DOB - 06/23/1956  Admit Date - 05/01/2019  Referring MD/NP/PA: Herb Grays  Outpatient Primary MD for the patient is Patient, No Pcp Per  Patient coming from: Home  Chief complaint-headache   HPI:    Zanasia Hickson  is a 63 y.o. female, with history of CAD, hyperlipidemia, ruptured cervical disc, right eye cataract, no surgery yet due to lack of health insurance, came to hospital with worsening right-sided headache for past 2 to 3 days.  Patient says that she has had this right-sided headache which she describes as pins and needle involving the right side of the face, right half of skull extending up to the right ear for past 5 years.  Denies nausea vomiting.  CT head showed mild chronic small vessel disease without acute intracranial abnormality. In the ED, chest x-ray showed large rounded density in the right lower lobe concerning for pneumonia or malignancy.  CT chest showed right lower lobe pneumonia also concerning for underlying [place].  Follow-up radiologic resolution recommended. Patient denies coughing up any phlegm. She denies fever but was found to be febrile in the ED, T-max one 101.2 She denies previous history of stroke or seizures. Denies nausea vomiting or diarrhea. Complains of chronic left hip pain, she had total hip arthroplasty after hip fracture.      Review of systems:    In addition to the HPI above,    All other systems reviewed and are negative.    Past History of the following :    Past Medical History:  Diagnosis Date  . Coronary artery disease   . Endometriosis   . Hyperlipidemia   . Hypotension   . Ruptured cervical disc   . Skin cancer   . Tobacco abuse       Past Surgical History:  Procedure Laterality Date  . ABDOMINAL HYSTERECTOMY    . ANTERIOR APPROACH HEMI HIP ARTHROPLASTY  Left 05/29/2016   Procedure: ANTERIOR APPROACH TOTAL HIP ARTHROPLASTY ;  Surgeon: Mcarthur Rossetti, MD;  Location: [facility];  Service: Orthopedics;  Laterality: Left;  . CARDIAC CATHETERIZATION    . CHOLECYSTECTOMY    . TONSILLECTOMY        Social History:      Social History   Tobacco Use  . Smoking status: Former Smoker    Packs/day: 1.00  . Smokeless tobacco: Never Used  Substance Use Topics  . Alcohol use: Not Currently       Family History :   Patient's both parents had cancer, she does not member what cancer they had.   Home Medications:   Prior to Admission medications   Medication Sig Start Date End Date Taking? Authorizing Provider  aspirin EC 325 MG EC tablet Take 1 tablet (325 mg total) by mouth 2 (two) times daily after a meal. 05/30/16   Mcarthur Rossetti, MD  methocarbamol (ROBAXIN) 500 MG tablet Take 1 tablet (500 mg total) by mouth every 6 (  six) hours as needed for muscle spasms. 05/30/16   Mcarthur Rossetti, MD  oxyCODONE-acetaminophen (ROXICET) 5-325 MG tablet Take 1-2 tablets by mouth every 4 (four) hours as needed for severe pain. 05/30/16   Mcarthur Rossetti, MD  pantoprazole (PROTONIX) 40 MG tablet Take 1 tablet (40 mg total) by mouth daily. 05/31/16   Caren Griffins, MD     Allergies:     Allergies  Allergen Reactions  . Cortisone     REACTION: Fluid retention and hives     Physical Exam:   Vitals  Blood pressure (!) 109/52, pulse 83, temperature 98.6 F (37 C), temperature source Oral, resp. rate (!) 22, height 5' 5.5" (1.664 m), weight 79.4 kg, SpO2 98 %.  1.  General: Appears in no acute distress  2. Psychiatric: Alert, oriented x3, intact insight and judgment  3. Neurologic: Cranial nerves II through grossly intact, motor strength 5/5 in all extremities  4. 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 Respiratory : Crackles auscultated at right lung base  6. Cardiovascular : S1-S2,  regular, no murmur auscultated  7. Gastrointestinal:  Abdomen is soft, nontender, no organomegaly    Data Review:    CBC Recent Labs  Lab 05/01/19 1833  WBC 11.8*  HGB 13.3  HCT 38.3  PLT 313  MCV 94.3  MCH 32.8  MCHC 34.7  RDW 13.2  LYMPHSABS 1.3  MONOABS 0.6  EOSABS 0.0  BASOSABS 0.1   ------------------------------------------------------------------------------------------------------------------  Results for orders placed or performed during the hospital encounter of 05/01/19 (from the past 48 hour(s))  Urinalysis, Routine w reflex microscopic     Status: Abnormal   Collection Time: 05/01/19  3:34 PM  Result Value Ref Range   Color, Urine AMBER (A) YELLOW    Comment: BIOCHEMICALS MAY BE AFFECTED BY COLOR   APPearance CLOUDY (A) CLEAR   Specific Gravity, Urine 1.021 1.005 - 1.030   pH 5.0 5.0 - 8.0   Glucose, UA NEGATIVE NEGATIVE mg/dL   Hgb urine dipstick SMALL (A) NEGATIVE   Bilirubin Urine NEGATIVE NEGATIVE   Ketones, ur NEGATIVE NEGATIVE mg/dL   Protein, ur 100 (A) NEGATIVE mg/dL   Nitrite NEGATIVE NEGATIVE   Leukocytes,Ua NEGATIVE NEGATIVE   RBC / HPF 6-10 0 - 5 RBC/hpf   WBC, UA 6-10 0 - 5 WBC/hpf   Bacteria, UA FEW (A) NONE SEEN   Squamous Epithelial / LPF 0-5 0 - 5   Mucus PRESENT    Granular Casts, UA PRESENT    Amorphous Crystal PRESENT     Comment: Performed at [facility], [address] [place], [place] [address]  CBC with Differential     Status: Abnormal   Collection Time: 05/01/19  6:33 PM  Result Value Ref Range   WBC 11.8 (H) 4.0 - 10.5 K/uL   RBC 4.06 3.87 - 5.11 MIL/uL   Hemoglobin 13.3 12.0 - 15.0 g/dL   HCT 38.3 36.0 - 46.0 %   MCV 94.3 80.0 - 100.0 fL   MCH 32.8 26.0 - 34.0 pg   MCHC 34.7 30.0 - 36.0 g/dL   RDW 13.2 11.5 - 15.5 %   Platelets 313 150 - 400 K/uL   nRBC 0.0 0.0 - 0.2 %   Neutrophils Relative % 82 %   Neutro Abs 9.7 (H) 1.7 - 7.7 K/uL   Lymphocytes Relative 11 %   Lymphs Abs 1.3 0.7 - 4.0 K/uL   Monocytes  Relative 5 %  Monocytes Absolute 0.6 0.1 - 1.0 K/uL   Eosinophils Relative 0 %   Eosinophils Absolute 0.0 0.0 - 0.5 K/uL   Basophils Relative 1 %   Basophils Absolute 0.1 0.0 - 0.1 K/uL   WBC Morphology TOXIC GRANULATION    Immature Granulocytes 1 %   Abs Immature Granulocytes 0.17 (H) 0.00 - 0.07 K/uL    Comment: Performed at [facility], [address] [place], [place] [address]  Comprehensive metabolic panel     Status: Abnormal   Collection Time: 05/01/19  6:33 PM  Result Value Ref Range   Sodium 130 (L) 135 - 145 mmol/L   Potassium 3.2 (L) 3.5 - 5.1 mmol/L   Chloride 98 98 - 111 mmol/L   CO2 15 (L) 22 - 32 mmol/L   Glucose, Bld 135 (H) 70 - 99 mg/dL   BUN 39 (H) 8 - 23 mg/dL   Creatinine, Ser 2.15 (H) 0.44 - 1.00 mg/dL   Calcium 8.4 (L) 8.9 - 10.3 mg/dL   Total Protein 7.0 6.5 - 8.1 g/dL   Albumin 2.8 (L) 3.5 - 5.0 g/dL   AST 33 15 - 41 U/L   ALT 24 0 - 44 U/L   Alkaline Phosphatase 120 38 - 126 U/L   Total Bilirubin 0.2 (L) 0.3 - 1.2 mg/dL   GFR calc non Af Amer 24 (L) >60 mL/min   GFR calc Af Amer 28 (L) >60 mL/min   Anion gap 17 (H) 5 - 15    Comment: Performed at [facility], [address] [place], [place] [address]  Sedimentation rate     Status: Abnormal   Collection Time: 05/01/19  6:33 PM  Result Value Ref Range   Sed Rate 108 (H) 0 - 22 mm/hr    Comment: Performed at [facility], [address], [place], [place] [address]  C-reactive protein     Status: Abnormal   Collection Time: 05/01/19  6:33 PM  Result Value Ref Range   CRP 48.0 (H) <1.0 mg/dL    Comment: RESULTS CONFIRMED BY MANUAL DILUTION Performed at [facility], [address], [place], [place] [address]   Lactic acid, plasma     Status: Abnormal   Collection Time: 05/01/19  6:36 PM  Result Value Ref Range   Lactic Acid, Venous 2.5 (HH) 0.5 - 1.9 mmol/L    Comment: CRITICAL RESULT CALLED TO, READ BACK BY AND VERIFIED WITH: WALKER,T ON 05/01/19 AT 1940 BY LOY,C Performed at [facility],  [address], [place], [place] [address]   Blood culture (routine x 2)     Status: None (Preliminary result)   Collection Time: 05/01/19  8:30 PM  Result Value Ref Range   Specimen Description BLOOD RIGHT ARM    Special Requests      BOTTLES DRAWN AEROBIC AND ANAEROBIC Blood Culture adequate volume Performed at [facility], [address] [place], [place] [address]    Culture PENDING    Report Status PENDING   Blood culture (routine x 2)     Status: None (Preliminary result)   Collection Time: 05/01/19  8:31 PM  Result Value Ref Range   Specimen Description BLOOD LEFT ARM    Special Requests      BOTTLES DRAWN AEROBIC AND ANAEROBIC Blood Culture adequate volume Performed at [facility], [address], [place], [place] [address]    Culture PENDING    Report Status PENDING   SARS Coronavirus 2 (CEPHEID- Performed in [facility] hospital lab), [facility]  Status: None   Collection Time: 05/01/19  8:36 PM  Result Value Ref Range   SARS Coronavirus 2 NEGATIVE NEGATIVE    Comment: (NOTE) If result is NEGATIVE SARS-CoV-2 target nucleic acids are NOT DETECTED. The SARS-CoV-2 RNA is generally detectable in upper and lower  respiratory specimens during the acute phase of infection. The lowest  concentration of SARS-CoV-2 viral copies this assay can detect is 250  copies / mL. A negative result does not preclude SARS-CoV-2 infection  and should not be used as the sole basis for treatment or other  patient management decisions.  A negative result may occur with  improper specimen collection / handling, submission of specimen other  than nasopharyngeal swab, presence of viral mutation(s) within the  areas targeted by this assay, and inadequate number of viral copies  (<250 copies / mL). A negative result must be combined with clinical  observations, patient history, and epidemiological information. If result is POSITIVE SARS-CoV-2 target nucleic acids are DETECTED. The SARS-CoV-2 RNA is  generally detectable in upper and lower  respiratory specimens dur ing the acute phase of infection.  Positive  results are indicative of active infection with SARS-CoV-2.  Clinical  correlation with patient history and other diagnostic information is  necessary to determine patient infection status.  Positive results do  not rule out bacterial infection or co-infection with other viruses. If result is PRESUMPTIVE POSTIVE SARS-CoV-2 nucleic acids MAY BE PRESENT.   A presumptive positive result was obtained on the submitted specimen  and confirmed on repeat testing.  While 2019 novel coronavirus  (SARS-CoV-2) nucleic acids may be present in the submitted sample  additional confirmatory testing may be necessary for epidemiological  and / or clinical management purposes  to differentiate between  SARS-CoV-2 and other Sarbecovirus currently known to infect humans.  If clinically indicated additional testing with an alternate test  methodology [phone]) is advised. The SARS-CoV-2 RNA is generally  detectable in upper and lower respiratory sp ecimens during the acute  phase of infection. The expected result is Negative. Fact Sheet for Patients:  StrictlyIdeas.no Fact Sheet for Healthcare Providers: BankingDealers.co.za This test is not yet approved or cleared by the [place] FDA and has been authorized for detection and/or diagnosis of SARS-CoV-2 by FDA under an Emergency Use Authorization (EUA).  This EUA will remain in effect (meaning this test can be used) for the duration of the COVID-19 declaration under Section 564(b)(1) of the Act, 21 U.S.C. section 360bbb-3(b)(1), unless the authorization is terminated or revoked sooner. Performed at [facility], [address], [place], [place] [address]     Chemistries  Recent Labs  Lab 05/01/19 1833  NA 130*  K 3.2*  CL 98  CO2 15*  GLUCOSE 135*  BUN 39*  CREATININE 2.15*  CALCIUM  8.4*  AST 33  ALT 24  ALKPHOS 120  BILITOT 0.2*   ------------------------------------------------------------------------------------------------------------------  ------------------------------------------------------------------------------------------------------------------ GFR: Estimated Creatinine Clearance: 28.2 mL/min (A) (by C-G formula based on SCr of 2.15 mg/dL (H)). Liver Function Tests: Recent Labs  Lab 05/01/19 1833  AST 33  ALT 24  ALKPHOS 120  BILITOT 0.2*  PROT 7.0  ALBUMIN 2.8*    --------------------------------------------------------------------------------------------------------------- Urine analysis:    Component Value Date/Time   COLORURINE AMBER (A) 05/01/2019 1534   APPEARANCEUR CLOUDY (A) 05/01/2019 1534   LABSPEC 1.021 05/01/2019 1534   PHURINE 5.0 05/01/2019 1534   GLUCOSEU NEGATIVE 05/01/2019 1534   HGBUR SMALL (A) 05/01/2019 1534   BILIRUBINUR NEGATIVE 05/01/2019 1534  KETONESUR NEGATIVE 05/01/2019 1534   PROTEINUR 100 (A) 05/01/2019 1534   NITRITE NEGATIVE 05/01/2019 1534   LEUKOCYTESUR NEGATIVE 05/01/2019 1534      Imaging Results:    Dg Chest 2 View  Result Date: 05/01/2019 CLINICAL DATA:  Fever. EXAM: CHEST - 2 VIEW COMPARISON:  Radiograph May 28, 2016. FINDINGS: The heart size and mediastinal contours are within normal limits. No pneumothorax or pleural effusion is noted. Left lung is clear. Large rounded density is seen in the right lower lobe concerning for possible malignancy or pneumonia. The visualized skeletal structures are unremarkable. IMPRESSION: Large rounded density seen in right lower lobe concerning for pneumonia or malignancy. CT scan of the chest with intravenous contrast is recommended for further evaluation. Electronically Signed   By: Marijo Conception M.D.   On: 05/01/2019 19:31   Ct Head Wo Contrast  Result Date: 05/01/2019 CLINICAL DATA:  Headache EXAM: CT HEAD WITHOUT CONTRAST TECHNIQUE: Contiguous axial  images were obtained from the base of the skull through the vertex without intravenous contrast. COMPARISON:  07/25/2006 FINDINGS: Brain: There is no mass, hemorrhage or extra-axial collection. The size and configuration of the ventricles and extra-axial CSF spaces are normal. There is hypoattenuation of the white matter, most commonly indicating chronic small vessel disease. Vascular: No abnormal hyperdensity of the major intracranial arteries or dural venous sinuses. No intracranial atherosclerosis. Skull: The visualized skull base, calvarium and extracranial soft tissues are normal. Sinuses/Orbits: No fluid levels or advanced mucosal thickening of the visualized paranasal sinuses. No mastoid or middle ear effusion. The orbits are normal. IMPRESSION: Mild chronic small vessel disease without acute intracranial abnormality. Electronically Signed   By: Ulyses Jarred M.D.   On: 05/01/2019 23:44   Ct Chest Wo Contrast  Result Date: 05/01/2019 CLINICAL DATA:  Unresolved complicated pneumonia. EXAM: CT CHEST WITHOUT CONTRAST TECHNIQUE: Multidetector CT imaging of the chest was performed following the standard protocol without IV contrast. COMPARISON:  CT of the pelvis dated 01/25/2006 FINDINGS: Cardiovascular: There is an aberrant right subclavian artery, a normal variant. Aortic calcifications are noted. The heart size is normal. There are a few coronary artery calcifications. There is no significant pericardial effusion. Mediastinum/Nodes: There are prominent mediastinal mediastinal and hilar lymph nodes. No significant axillary adenopathy. No pathologically enlarged supraclavicular lymph nodes. Lungs/Pleura: There is consolidation involving approximately 50% of the right lower lobe. There is no pneumothorax. No large pleural effusion. Upper Abdomen: No acute abnormality. Musculoskeletal: There is a chronic appearing fracture involving the T12 vertebral body. There is no acute displaced fracture identified on  today's exam. IMPRESSION: 1. Right lower lobe pneumonia. Follow-up to radiologic resolution is recommended. 2. Prominent mediastinal and hilar lymph nodes, presumably reactive. 3. Incidentally noted aberrant right subclavian artery, a normal variant. Aortic Atherosclerosis (ICD10-I70.0). Electronically Signed   By: Constance Holster M.D.   On: 05/01/2019 23:33    My personal review of EKG: Rhythm NSR, nonspecific ST changes.   Assessment & Plan:    Active Problems:   CAP (community acquired pneumonia)   1. Community-acquired pneumonia-seen on the CT chest, started on ceftriaxone and Zithromax.  Continue with antibiotics.  Check urinary strep pneumo antigen.  Follow blood culture results.  2. Large round density-chest x-ray initially showed large round density in the right lower lobe concerning for malignancy versus pneumonia.  CT chest confirms pneumonia but also recommends follow-up to check for resolution of infiltrate.  Patient has 40 years history of tobacco use.  Will need repeat chest x-ray in  3 to 4 weeks.  3. Right-sided headache-patient has chronic right-sided headache, paresthesia for past 5 years.  She has not seen a neurologist.  Will consult neurology in a.m.  She might need an MRI.  4. Hypokalemia-potassium was 3.2, will replace potassium and check BMP in a.m.  5. Acute kidney injury-unclear etiology, Creatinine is 2.15.  Will start on IV normal saline.  Check urine sodium, urine creatinine, urine albumin creatinine ratio  6. CAD-stable, continue aspirin   Social issues-  patient will [facility] as she lacks health insurance.  DVT Prophylaxis-   Lovenox   AM Labs Ordered, also please review Full Orders  Family Communication: Admission, patients condition and plan of care including tests being ordered have been discussed with the patient  who indicate understanding and agree with the plan and Code Status.  Code Status:   Full  code  Admission status: Inpatient: Based on patients clinical presentation and evaluation of above clinical data, I have made determination that patient meets Inpatient criteria at this time.  Time spent in minutes : 60 minutes   Oswald Hillock M.D on 05/02/2019 at 1:57 AM

## 2019-05-03 ENCOUNTER — Encounter (HOSPITAL_COMMUNITY): Payer: Self-pay | Admitting: General Surgery

## 2019-05-03 ENCOUNTER — Encounter (HOSPITAL_COMMUNITY)
Admission: EM | Disposition: A | Discharge status: Home / Self Care | Payer: Self-pay | Source: Home / Self Care | Attending: Internal Medicine

## 2019-05-03 DIAGNOSIS — R51 Headache: Secondary | ICD-10-CM

## 2019-05-03 HISTORY — PX: ARTERY BIOPSY: SHX891

## 2019-05-03 LAB — BASIC METABOLIC PANEL
Anion gap: 10 (ref 5–15)
BUN: 21 mg/dL (ref 8–23)
CO2: 22 mmol/L (ref 22–32)
Calcium: 8.2 mg/dL — ABNORMAL LOW (ref 8.9–10.3)
Chloride: 109 mmol/L (ref 98–111)
Creatinine, Ser: 1.05 mg/dL — ABNORMAL HIGH (ref 0.44–1.00)
GFR calc Af Amer: >60 mL/min (ref >60)
GFR calc non Af Amer: 56 mL/min — ABNORMAL LOW (ref >60)
Glucose, Bld: 103 mg/dL — ABNORMAL HIGH (ref 70–99)
Potassium: 3.2 mmol/L — ABNORMAL LOW (ref 3.5–5.1)
Sodium: 141 mmol/L (ref 135–145)

## 2019-05-03 LAB — CBC
HCT: 36.4 % (ref 36.0–46.0)
Hemoglobin: 12.2 g/dL (ref 12.0–15.0)
MCH: 32.4 pg (ref 26.0–34.0)
MCHC: 33.5 g/dL (ref 30.0–36.0)
MCV: 96.8 fL (ref 80.0–100.0)
Platelets: 377 10*3/uL (ref 150–400)
RBC: 3.76 MIL/uL — ABNORMAL LOW (ref 3.87–5.11)
RDW: 13.8 % (ref 11.5–15.5)
WBC: 8.7 10*3/uL (ref 4.0–10.5)
nRBC: 0 % (ref 0.0–0.2)

## 2019-05-03 LAB — URINE CULTURE: Culture: >=100000 — AB

## 2019-05-03 LAB — MICROALBUMIN / CREATININE URINE RATIO
Creatinine, Urine: 108.9 mg/dL
Microalb Creat Ratio: 36 mg/g creat — ABNORMAL HIGH (ref 0–29)
Microalb, Ur: 39.7 ug/mL — ABNORMAL HIGH

## 2019-05-03 LAB — MAGNESIUM: Magnesium: 2.3 mg/dL (ref 1.7–2.4)

## 2019-05-03 LAB — PROCALCITONIN: Procalcitonin: 0.76 ng/mL

## 2019-05-03 LAB — HIV ANTIBODY (ROUTINE TESTING W REFLEX): HIV Screen 4th Generation wRfx: NONREACTIVE

## 2019-05-03 SURGERY — MINOR BIOPSY TEMPORAL ARTERY
Anesthesia: LOCAL | Laterality: Right

## 2019-05-03 MED ORDER — PREDNISONE 20 MG PO TABS
80.0000 mg | ORAL_TABLET | Freq: Every day | ORAL | Status: DC
  Administered 2019-05-03 – 2019-05-04 (×2): 80 mg via ORAL
  Filled 2019-05-03 (×3): qty 4

## 2019-05-03 MED ORDER — LIDOCAINE HCL (PF) 1 % IJ SOLN
INTRAMUSCULAR | Status: AC
  Filled 2019-05-03: qty 30

## 2019-05-03 MED ORDER — POTASSIUM CHLORIDE CRYS ER 20 MEQ PO TBCR
40.0000 meq | EXTENDED_RELEASE_TABLET | Freq: Once | ORAL | Status: AC
  Administered 2019-05-03: 18:00:00 40 meq via ORAL
  Filled 2019-05-03: qty 2

## 2019-05-03 SURGICAL SUPPLY — 24 items
ADH SKN CLS APL DERMABOND .7 (GAUZE/BANDAGES/DRESSINGS) ×1
CHLORAPREP W/TINT 10.5 ML (MISCELLANEOUS) ×1 IMPLANT
DECANTER SPIKE VIAL GLASS SM (MISCELLANEOUS) ×1 IMPLANT
DERMABOND ADVANCED (GAUZE/BANDAGES/DRESSINGS) ×1
DERMABOND ADVANCED .7 DNX12 (GAUZE/BANDAGES/DRESSINGS) IMPLANT
DRAPE PROXIMA HALF (DRAPES) ×1 IMPLANT
ELECT NDL TIP 2.8 STRL (NEEDLE) IMPLANT
ELECT NEEDLE TIP 2.8 STRL (NEEDLE) ×2 IMPLANT
ELECT REM PT RETURN 9FT ADLT (ELECTROSURGICAL) ×2
ELECTRODE REM PT RTRN 9FT ADLT (ELECTROSURGICAL) IMPLANT
GLOVE BIOGEL PI IND STRL 6.5 (GLOVE) IMPLANT
GLOVE BIOGEL PI INDICATOR 6.5 (GLOVE) ×1
GLOVE SS BIOGEL STRL SZ 6.5 (GLOVE) IMPLANT
GLOVE SUPERSENSE BIOGEL SZ 6.5 (GLOVE) ×1
GOWN STRL REUS W/TWL LRG LVL3 (GOWN DISPOSABLE) ×1 IMPLANT
NDL HYPO 25X1 1.5 SAFETY (NEEDLE) IMPLANT
NEEDLE HYPO 25X1 1.5 SAFETY (NEEDLE) ×2 IMPLANT
PENCIL HANDSWITCHING (ELECTRODE) ×1 IMPLANT
SUT MNCRL AB 4-0 PS2 18 (SUTURE) ×1 IMPLANT
SUT VIC AB 3-0 SH 27 (SUTURE) ×2
SUT VIC AB 3-0 SH 27X BRD (SUTURE) IMPLANT
SUT VICRYL AB 3 0 TIES (SUTURE) ×1 IMPLANT
SYR CONTROL 10ML LL (SYRINGE) ×1 IMPLANT
TOWEL OR 17X26 4PK STRL BLUE (TOWEL DISPOSABLE) ×1 IMPLANT

## 2019-05-03 NOTE — Discharge Instructions (Signed)
Temporal Artery Biopsy, Care After Refer to this sheet in the next few weeks. These instructions provide you with information about caring for yourself after your procedure. Your health care provider may also give you more specific instructions. Your treatment has been planned according to current medical practices, but problems sometimes occur. Call your health care provider if you have any problems or questions after your procedure. What can I expect after the procedure? After the procedure, it is common to have:  Soreness.  Bruising.  Numbness.  Swelling. Follow these instructions at home: Incision care  Follow instructions from your health care provider about how to take care of the cut made during surgery (incision). Make sure you: ? Wash your hands with soap and water before you change your bandage (dressing). If soap and water are not available, use hand sanitizer. ? Change your dressing as told by your health care provider. ? Leave stitches (sutures), skin glue, or adhesive strips in place. These skin closures may need to stay in place for 2 weeks or longer. If adhesive strip edges start to loosen and curl up, you may trim the loose edges. Do not remove adhesive strips completely unless your health care provider tells you to do that.  Check your incision area every day for signs of infection. Watch for: ? More redness, swelling, or pain. ? More fluid or blood. ? Warmth. ? Pus or a bad smell. Activity  Do not do any strenuous exercise for about 1 week. General instructions   Take over-the-counter and prescription medicines only as told by your health care provider. Do not take aspirin or other NSAIDs unless told by your health care provider. Aspirin may increase your risk of bleeding at the incision site.  Follow your health care provider's instructions on bathing or showering.  Keep all follow-up visits as told by your health care provider. This is important. Contact a  health care provider if:  Medicine does not help your pain.  You have a fever or chills.  You have more redness, swelling, or pain around your incision site.  You have more fluid or blood coming from your incision site.  Your incision feels warm to the touch.  You have pus or a bad smell coming from your incision site.  You have a fever.  You develop nausea or vomiting. Get help right away if:  You have bleeding from the incision that does not stop after 30 minutes of applying heavy pressure.  You have chest pain.  You have shortness of breath.  You faint.  You have sudden vision loss.  You develop weakness or drooping in your face or eye. This information is not intended to replace advice given to you by your health care provider. Make sure you discuss any questions you have with your health care provider. Document Released: 12/12/2015 Document Revised: 07/15/2016 Document Reviewed: 05/05/2015 Elsevier Interactive Patient Education  2019 [employer].

## 2019-05-03 NOTE — TOC Initial Note (Deleted)
Transition of Care [facility]) - Initial/Assessment Note    Patient Details  Name: [name] MRN: [id number] Date of Birth: [birth year]  Transition [facility] CM/SW Contact:    Ihor Gully, LCSW Phone Number: 05/03/2019, 3:50 PM  Clinical Narrative:                   Expected Discharge Plan: Home/Self Care Barriers to Discharge: No Barriers Identified   Patient Goals and CMS Choice        Expected Discharge Plan and Services Expected Discharge Plan: Home/Self Care       Living arrangements for the past 2 months: [place]                                      Prior Living Arrangements/Services Living arrangements for the past 2 months: Single Family Home Lives with:: Spouse Patient language and need for interpreter reviewed:: Yes Do you feel safe going back to the place where you live?: Yes      Need for Family Participation in Patient Care: Yes (Comment) Care giver support system in place?: Yes (comment)   Criminal Activity/Legal Involvement Pertinent to Current Situation/Hospitalization: No - Comment as needed  Activities of Daily Living Home Assistive Devices/Equipment: None ADL Screening (condition at time of admission) Patient's cognitive ability adequate to safely complete daily activities?: Yes Is the patient deaf or have difficulty hearing?: No Does the patient have difficulty seeing, even when wearing glasses/contacts?: Yes Does the patient have difficulty concentrating, remembering, or making decisions?: No Patient able to express need for assistance with ADLs?: No Does the patient have difficulty dressing or bathing?: No Independently performs ADLs?: Yes (appropriate for developmental age) Does the patient have difficulty walking or climbing stairs?: No Weakness of Legs: None Weakness of Arms/Hands: None  Permission Sought/Granted                  Emotional Assessment Appearance:: Appears stated age     Orientation: :  Oriented to Self, Oriented to Place, Oriented to  Time, Oriented to Situation Alcohol / Substance Use: Not Applicable Psych Involvement: No (comment)  Admission diagnosis:  AKI (acute kidney injury) ([place]) [N17.9] Nonintractable headache, unspecified chronicity pattern, unspecified headache type [R51] Community acquired pneumonia of right lower lobe of lung ([place]) [J18.1] Patient Active Problem List   Diagnosis Date Noted  . CAP (community acquired pneumonia) 05/02/2019  . Lobar pneumonia ([place]) 05/02/2019  . AKI (acute kidney injury) ([place])   . Nonintractable headache   . Hip fracture, left ([place]) 05/28/2016  . Hypertension 05/28/2016  . Hypokalemia 05/28/2016  . Closed left hip fracture ([place]) 05/28/2016  . CARCINOMA, SKIN, SQUAMOUS CELL 06/05/2009  . RESTLESS LEG SYNDROME 06/06/2007  . GERD 01/27/2007  . HYPERLIPIDEMIA 10/19/2006  . TOBACCO ABUSE 10/19/2006  . Coronary atherosclerosis 10/19/2006  . BRONCHITIS, CHRONIC NOS 10/19/2006  . ASTHMA 10/19/2006  . LOW BACK PAIN 10/19/2006  . HYPERGLYCEMIA 10/19/2006   PCP:  Patient, No Pcp Per Pharmacy:   [id number] - [place], [place] [address] Phone: [phone] Fax: [phone]     Social Determinants of Health (SDOH) Interventions    Readmission Risk Interventions No flowsheet data found.

## 2019-05-03 NOTE — Progress Notes (Signed)
PROGRESS NOTE  [name] VKF:[id number] DOB: 01/30/1956 DOA: 05/01/2019 PCP: Patient, No Pcp Per  Brief History:  63 year old female with a history of coronary disease, hyperlipidemia, tobacco abuse presenting with a chief complaint of right forehead headache.  The patient states that she has had this headache for almost 4 years.  The patient is a difficult historian, but from what is gathered from the clinical history it appears that the headache has been episodic waxing and waning over the last 4 years.  However in the last 2 to 3 months, she has noted increasing frequency.  She describes some allodynia to the right frontoparietal area with intermittent sharp pains.  The patient has been taking acetaminophen for pain.  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  She has been attributing her blindness to cataracts.  She stated that she saw either an optometrist or ophthalmologist 4 years ago, but cannot clarify any additional work-up other than the fact that she had a cataract.  On time of admission, the patient was noted to have a fever 101.2 F.  As result, chest x-ray was obtained and showed right lower lobe opacity.  CT of the chest subsequently showed consolidation in the right lower lobe with mediastinal lymphadenopathy.  The patient was started on Rocephin and azithromycin.  Assessment/Plan: Lobar pneumonia -Check procalcitonin--0.76 -Continue ceftriaxone and azithromycin -Repeat chest x-ray in 1 month  Right frontal parietal headache -Check ESR--108 -CRP--48.0 -Cannot rule out temporal arteritis -Consult general surgery for biopsy-->biopsied 6/4 -started prednisone   Acute kidney injury -Baseline creatinine 0.7-0.9 -Serum creatinine peaked 2.5 -Continue IV fluids-->improved  Hypokalemia -Replete -Check magnesium--2.3  Tobacco Abuse -I have discussed tobacco cessation with the patient.  I  have counseled the patient regarding the negative impacts of continued tobacco use including but not limited to lung cancer, COPD, and cardiovascular disease.  I have discussed alternatives to tobacco and modalities that may help facilitate tobacco cessation including but not limited to biofeedback, hypnosis, and medications.  Total time spent with tobacco counseling was 4 minutes.  Coronary Artery Disease -no chest pain -continue ASA     Disposition Plan:   Home 6/5 if stable Family Communication:   Family at bedside  Consultants:  General surgery  Code Status:  FULL  DVT Prophylaxis:  Anchor Lovenox   Procedures: As Listed in Progress Note Above  Antibiotics: None      Subjective: Patient denies fevers, chills, headache, chest pain, dyspnea, nausea, vomiting, diarrhea, abdominal pain, dysuria, hematuria, hematochezia, and melena. Complains or right frontoparietal headache. No visual change  Objective: Vitals:   05/02/19 2206 05/03/19 0526 05/03/19 1437 05/03/19 1442  BP: 119/69 (!) 111/57 (!) 109/59 111/69  Pulse: 83 66 66 64  Resp: 16 16 16 18   Temp: 99.3 F (37.4 C) 98.4 F (36.9 C)  98.9 F (37.2 C)  TempSrc: Oral Oral  Oral  SpO2: 100% 98% 98%   Weight:      Height:        Intake/Output Summary (Last 24 hours) at 05/03/2019 1819 Last data filed at 05/03/2019 0500 Gross per 24 hour  Intake 872.72 ml  Output 900 ml  Net -27.28 ml   Weight change:  Exam:   General:  Pt is alert, follows commands appropriately, not in acute distress  HEENT: No icterus, No thrush, No neck mass, [place]/AT  Cardiovascular: RRR, S1/S2, no rubs,  no gallops  Respiratory: bibasilar rales, no wheeze  Abdomen: Soft/+BS, non tender, non distended, no guarding  Extremities: No edema, No lymphangitis, No petechiae, No rashes, no synovitis   Data Reviewed: I have personally reviewed following labs and imaging studies Basic Metabolic Panel: Recent Labs  Lab  05/01/19 1833 05/02/19 0509 05/03/19 0648  NA 130* 137 141  K 3.2* 3.2* 3.2*  CL 98 102 109  CO2 15* 20* 22  GLUCOSE 135* 110* 103*  BUN 39* 34* 21  CREATININE 2.15* 1.60* 1.05*  CALCIUM 8.4* 8.3* 8.2*  MG  --   --  2.3   Liver Function Tests: Recent Labs  Lab 05/01/19 1833 05/02/19 0509  AST 33 30  ALT 24 22  ALKPHOS 120 120  BILITOT 0.2* 0.3  PROT 7.0 6.6  ALBUMIN 2.8* 2.6*   No results for input(s): LIPASE, AMYLASE in the last 168 hours. No results for input(s): AMMONIA in the last 168 hours. Coagulation Profile: No results for input(s): INR, PROTIME in the last 168 hours. CBC: Recent Labs  Lab 05/01/19 1833 05/02/19 0509 05/03/19 0648  WBC 11.8* 12.0* 8.7  NEUTROABS 9.7*  --   --   HGB 13.3 12.9 12.2  HCT 38.3 39.1 36.4  MCV 94.3 96.8 96.8  PLT 313 346 377   Cardiac Enzymes: No results for input(s): CKTOTAL, CKMB, CKMBINDEX, TROPONINI in the last 168 hours. BNP: Invalid input(s): POCBNP CBG: No results for input(s): GLUCAP in the last 168 hours. HbA1C: No results for input(s): HGBA1C in the last 72 hours. Urine analysis:    Component Value Date/Time   COLORURINE AMBER (A) 05/01/2019 1534   APPEARANCEUR CLOUDY (A) 05/01/2019 1534   LABSPEC 1.021 05/01/2019 1534   PHURINE 5.0 05/01/2019 1534   GLUCOSEU NEGATIVE 05/01/2019 1534   HGBUR SMALL (A) 05/01/2019 1534   BILIRUBINUR NEGATIVE 05/01/2019 1534   KETONESUR NEGATIVE 05/01/2019 1534   PROTEINUR 100 (A) 05/01/2019 1534   NITRITE NEGATIVE 05/01/2019 1534   LEUKOCYTESUR NEGATIVE 05/01/2019 1534   Sepsis Labs: @LABRCNTIP (procalcitonin:4,lacticidven:4) ) Recent Results (from the past 240 hour(s))  Urine culture     Status: Abnormal   Collection Time: 05/01/19  3:34 PM  Result Value Ref Range Status   Specimen Description   Final    URINE, RANDOM Performed at [facility], [address] [place], [place] [address]    Special Requests   Final    NONE Performed at [facility], [address] [place], [place] [address]    Culture (A)  Final    >=100,000 COLONIES/mL CORYNEBACTERIUM SPECIES Standardized susceptibility testing for this organism is not available. Performed at [facility], [place] [address] [place], [place] [address]    Report Status 05/03/2019 FINAL  Final  Blood culture (routine x 2)     Status: None (Preliminary result)   Collection Time: 05/01/19  8:30 PM  Result Value Ref Range Status   Specimen Description BLOOD RIGHT ARM  Final   Special Requests   Final    BOTTLES DRAWN AEROBIC AND ANAEROBIC Blood Culture adequate volume   Culture   Final    NO GROWTH 2 DAYS Performed at [facility], [address] [place], [place] [address]    Report Status PENDING  Incomplete  Blood culture (routine x 2)     Status: None (Preliminary result)   Collection Time: 05/01/19  8:31 PM  Result Value Ref Range Status   Specimen Description BLOOD LEFT ARM  Final   Special  Requests   Final    BOTTLES DRAWN AEROBIC AND ANAEROBIC Blood Culture adequate volume   Culture   Final    NO GROWTH 2 DAYS Performed at [facility], [address] [place], [place] [address]    Report Status PENDING  Incomplete  SARS Coronavirus 2 (CEPHEID- Performed in [place] hospital lab), Hosp Order     Status: None   Collection Time: 05/01/19  8:36 PM  Result Value Ref Range Status   SARS Coronavirus 2 NEGATIVE NEGATIVE Final    Comment: (NOTE) If result is NEGATIVE SARS-CoV-2 target nucleic acids are NOT DETECTED. The SARS-CoV-2 RNA is generally detectable in upper and lower  respiratory specimens during the acute phase of infection. The lowest  concentration of SARS-CoV-2 viral copies this assay can detect is 250  copies / mL. A negative result does not preclude SARS-CoV-2 infection  and should not be used as the sole basis for treatment or other  patient management decisions.  A negative result may occur with  improper specimen collection / handling, submission of specimen  other  than nasopharyngeal swab, presence of viral mutation(s) within the  areas targeted by this assay, and inadequate number of viral copies  (<250 copies / mL). A negative result must be combined with clinical  observations, patient history, and epidemiological information. If result is POSITIVE SARS-CoV-2 target nucleic acids are DETECTED. The SARS-CoV-2 RNA is generally detectable in upper and lower  respiratory specimens dur ing the acute phase of infection.  Positive  results are indicative of active infection with SARS-CoV-2.  Clinical  correlation with patient history and other diagnostic information is  necessary to determine patient infection status.  Positive results do  not rule out bacterial infection or co-infection with other viruses. If result is PRESUMPTIVE POSTIVE SARS-CoV-2 nucleic acids MAY BE PRESENT.   A presumptive positive result was obtained on the submitted specimen  and confirmed on repeat testing.  While 2019 novel coronavirus  (SARS-CoV-2) nucleic acids may be present in the submitted sample  additional confirmatory testing may be necessary for epidemiological  and / or clinical management purposes  to differentiate between  SARS-CoV-2 and other Sarbecovirus currently known to infect humans.  If clinically indicated additional testing with an alternate test  methodology [phone]) is advised. The SARS-CoV-2 RNA is generally  detectable in upper and lower respiratory sp ecimens during the acute  phase of infection. The expected result is Negative. Fact Sheet for Patients:  StrictlyIdeas.no Fact Sheet for Healthcare Providers: BankingDealers.co.za This test is not yet approved or cleared by the [place] FDA and has been authorized for detection and/or diagnosis of SARS-CoV-2 by FDA under an Emergency Use Authorization (EUA).  This EUA will remain in effect (meaning this test can be used) for the duration  of the COVID-19 declaration under Section 564(b)(1) of the Act, 21 U.S.C. section 360bbb-3(b)(1), unless the authorization is terminated or revoked sooner. Performed at [facility], [address] [place], [place] [address]      Scheduled Meds:  aspirin  325 mg Oral BID PC   enoxaparin (LOVENOX) injection  40 mg Subcutaneous Q24H   pantoprazole  40 mg Oral Daily   predniSONE  80 mg Oral Q breakfast   Continuous Infusions:  sodium chloride 75 mL/hr at 05/03/19 0945   azithromycin 500 mg (05/03/19 1739)   cefTRIAXone (ROCEPHIN)  IV 1 g (05/02/19 2017)    Procedures/Studies: Dg Chest 2 View  Result Date: 05/01/2019 CLINICAL DATA:  Fever. EXAM:  CHEST - 2 VIEW COMPARISON:  Radiograph May 28, 2016. FINDINGS: The heart size and mediastinal contours are within normal limits. No pneumothorax or pleural effusion is noted. Left lung is clear. Large rounded density is seen in the right lower lobe concerning for possible malignancy or pneumonia. The visualized skeletal structures are unremarkable. IMPRESSION: Large rounded density seen in right lower lobe concerning for pneumonia or malignancy. CT scan of the chest with intravenous contrast is recommended for further evaluation. Electronically Signed   By: Marijo Conception M.D.   On: 05/01/2019 19:31   Ct Head Wo Contrast  Result Date: 05/01/2019 CLINICAL DATA:  Headache EXAM: CT HEAD WITHOUT CONTRAST TECHNIQUE: Contiguous axial images were obtained from the base of the skull through the vertex without intravenous contrast. COMPARISON:  07/25/2006 FINDINGS: Brain: There is no mass, hemorrhage or extra-axial collection. The size and configuration of the ventricles and extra-axial CSF spaces are normal. There is hypoattenuation of the white matter, most commonly indicating chronic small vessel disease. Vascular: No abnormal hyperdensity of the major intracranial arteries or dural venous sinuses. No intracranial atherosclerosis. Skull: The  visualized skull base, calvarium and extracranial soft tissues are normal. Sinuses/Orbits: No fluid levels or advanced mucosal thickening of the visualized paranasal sinuses. No mastoid or middle ear effusion. The orbits are normal. IMPRESSION: Mild chronic small vessel disease without acute intracranial abnormality. Electronically Signed   By: Ulyses Jarred M.D.   On: 05/01/2019 23:44   Ct Chest Wo Contrast  Result Date: 05/01/2019 CLINICAL DATA:  Unresolved complicated pneumonia. EXAM: CT CHEST WITHOUT CONTRAST TECHNIQUE: Multidetector CT imaging of the chest was performed following the standard protocol without IV contrast. COMPARISON:  CT of the pelvis dated 01/25/2006 FINDINGS: Cardiovascular: There is an aberrant right subclavian artery, a normal variant. Aortic calcifications are noted. The heart size is normal. There are a few coronary artery calcifications. There is no significant pericardial effusion. Mediastinum/Nodes: There are prominent mediastinal mediastinal and hilar lymph nodes. No significant axillary adenopathy. No pathologically enlarged supraclavicular lymph nodes. Lungs/Pleura: There is consolidation involving approximately 50% of the right lower lobe. There is no pneumothorax. No large pleural effusion. Upper Abdomen: No acute abnormality. Musculoskeletal: There is a chronic appearing fracture involving the T12 vertebral body. There is no acute displaced fracture identified on today's exam. IMPRESSION: 1. Right lower lobe pneumonia. Follow-up to radiologic resolution is recommended. 2. Prominent mediastinal and hilar lymph nodes, presumably reactive. 3. Incidentally noted aberrant right subclavian artery, a normal variant. Aortic Atherosclerosis (ICD10-I70.0). Electronically Signed   By: Constance Holster M.D.   On: 05/01/2019 23:33   US Carotid Bilateral  Result Date: 05/02/2019 CLINICAL DATA:  63 year old female with periorbital headache EXAM: BILATERAL CAROTID DUPLEX ULTRASOUND  TECHNIQUE: Pearline Cables scale imaging, color Doppler and duplex ultrasound were performed of bilateral carotid and vertebral arteries in the neck. COMPARISON:  None. FINDINGS: Criteria: Quantification of carotid stenosis is based on velocity parameters that correlate the residual internal carotid diameter with NASCET-based stenosis levels, using the diameter of the distal internal carotid lumen as the denominator for stenosis measurement. The following velocity measurements were obtained: RIGHT ICA: 114/26 cm/sec CCA: 643/32 cm/sec SYSTOLIC ICA/CCA RATIO:  1.1 ECA:  107 cm/sec LEFT ICA: 116/29 cm/sec CCA: 951/88 cm/sec SYSTOLIC ICA/CCA RATIO:  1.1 ECA:  113 cm/sec RIGHT CAROTID ARTERY: Mild heterogeneous atherosclerotic plaque in the proximal internal carotid artery. By peak systolic velocity criteria, the estimated stenosis remains less than 50%. RIGHT VERTEBRAL ARTERY:  Patent with normal antegrade flow. LEFT CAROTID  ARTERY: Mild smooth heterogeneous atherosclerotic plaque in the proximal and mid internal carotid artery. By peak systolic velocity criteria, the estimated stenosis remains less than 50%. LEFT VERTEBRAL ARTERY:  Patent with normal antegrade flow. IMPRESSION: 1. Mild (1-49%) stenosis proximal right internal carotid artery secondary to heterogenous atherosclerotic plaque. 2. Mild (1-49%) stenosis proximal left internal carotid artery secondary to heterogenous atherosclerotic plaque. 3. The vertebral arteries are patent with normal antegrade flow. Signed, Criselda Peaches, MD, [facility] Electronically Signed   By: Jacqulynn Cadet M.D.   On: 05/02/2019 15:04    Orson Eva, DO  [facility] Pager [phone]  If 7PM-7AM, please contact night-coverage [email or web address] Password [email or web address] 05/03/2019, 6:19 PM   LOS: 1 day

## 2019-05-03 NOTE — Progress Notes (Signed)
Procedure complete. dermabond to incision. No drainage or edema at operative site right temporal area. Report called to Autum RN. Voiced understanding. Transferred to [facility] via W/C in good condition. Gold watch returned to pt and placed on left wrist per pt.

## 2019-05-03 NOTE — Consult Note (Signed)
[facility] Consult  Reason for Consult: Temporal artery biopsy, Headaches  Referring Physician:  Dr. Merlene Laughter  Chief Complaint    Headache      [name] is a 63 y.o. female.  HPI: [name] is a 63 yo who has had headaches for years but reports worsening headaches with pain on the right parietal/ temporal region for the past 4 months. She says that the headaches started about 4 years ago and she lost her right eye vision.  she says she was worked up and told it was a cataract. She says her headaches have improved since being in the hospital and that is feeling somewhat better. She was seen in the ED with the primary complaint of the headache without any other associated symptoms reported, but then was found to be febrile and have a pneumonia on imaging. She is now being treated for her pneumonia with antibiotics.  She says she stopped smoking about 2 weeks ago.  She was seen by Dr. Merlene Laughter yesterday who recommended temporal artery biopsy prior to steroid treatment.    Past Medical History:  Diagnosis Date  . Coronary artery disease   . Endometriosis   . Hyperlipidemia   . Hypotension   . Ruptured cervical disc   . Skin cancer   . Tobacco abuse     Past Surgical History:  Procedure Laterality Date  . ABDOMINAL HYSTERECTOMY    . ANTERIOR APPROACH HEMI HIP ARTHROPLASTY Left 05/29/2016   Procedure: ANTERIOR APPROACH TOTAL HIP ARTHROPLASTY ;  Surgeon: Mcarthur Rossetti, MD;  Location: [facility];  Service: Orthopedics;  Laterality: Left;  . CARDIAC CATHETERIZATION    . CHOLECYSTECTOMY    . TONSILLECTOMY      Family History  Problem Relation Age of Onset  . Cancer Mother   . Cancer Father   Unknown type of cancer   Social History   Tobacco Use  . Smoking status: Former Smoker    Packs/day: 1.00  . Smokeless tobacco: Never Used  Substance Use Topics  . Alcohol use: Not Currently  . Drug use: Not Currently    Medications:  I have reviewed the  patient's current medications. Prior to Admission:  Medications Prior to Admission  Medication Sig Dispense Refill Last Dose  . acetaminophen (TYLENOL) 325 MG tablet Take 650 mg by mouth every 6 (six) hours as needed.     Marland Kitchen aspirin EC 325 MG EC tablet Take 1 tablet (325 mg total) by mouth 2 (two) times daily after a meal. (Patient not taking: Reported on 05/02/2019) 30 tablet 0 Not Taking at Unknown time  . methocarbamol (ROBAXIN) 500 MG tablet Take 1 tablet (500 mg total) by mouth every 6 (six) hours as needed for muscle spasms. (Patient not taking: Reported on 05/02/2019) 60 tablet 0 Not Taking at Unknown time  . oxyCODONE-acetaminophen (ROXICET) 5-325 MG tablet Take 1-2 tablets by mouth every 4 (four) hours as needed for severe pain. (Patient not taking: Reported on 05/02/2019) 84 tablet 0 Not Taking at Unknown time  . pantoprazole (PROTONIX) 40 MG tablet Take 1 tablet (40 mg total) by mouth daily. (Patient not taking: Reported on 05/02/2019) 30 tablet 0 Not Taking at Unknown time   Scheduled: . aspirin  325 mg Oral BID PC  . enoxaparin (LOVENOX) injection  40 mg Subcutaneous Q24H  . pantoprazole  40 mg Oral Daily   Continuous: . sodium chloride 75 mL/hr at 05/02/19 1656  . azithromycin 500 mg (05/02/19 1709)  . cefTRIAXone (  ROCEPHIN)  IV 1 g (05/02/19 2017)   EXH:BZJIRCVELFYBO, ondansetron **OR** ondansetron (ZOFRAN) IV, oxyCODONE-acetaminophen  Allergies  Allergen Reactions  . Cortisone     REACTION: Fluid retention and hives    ROS:  A comprehensive review of systems was negative except for: Constitutional: positive for fevers Respiratory: positive for PNA Neurological: positive for headaches  Blood pressure (!) 111/57, pulse 66, temperature 98.4 F (36.9 C), temperature source Oral, resp. rate 16, height 5' 5.5" (1.664 m), weight 79.4 kg, SpO2 98 %. Physical Exam Vitals signs reviewed.  Constitutional:      Appearance: She is well-developed.  HENT:     Head: Normocephalic.      Comments: Right temporal pulse palpable, somewhat tender Eyes:     Extraocular Movements: Extraocular movements intact.  Neck:     Musculoskeletal: Normal range of motion.  Cardiovascular:     Rate and Rhythm: Regular rhythm.  Pulmonary:     Effort: Pulmonary effort is normal.  Abdominal:     General: There is no distension.     Palpations: Abdomen is soft.     Tenderness: There is no abdominal tenderness.  Musculoskeletal:        General: No swelling.     Right shoulder: She exhibits no swelling.  Skin:    General: Skin is warm and dry.  Neurological:     Mental Status: She is alert and oriented to person, place, and time.  Psychiatric:        Mood and Affect: Mood normal.        Behavior: Behavior normal.     Results: Results for orders placed or performed during the hospital encounter of 05/01/19 (from the past 48 hour(s))  Urinalysis, Routine w reflex microscopic     Status: Abnormal   Collection Time: 05/01/19  3:34 PM  Result Value Ref Range   Color, Urine AMBER (A) YELLOW    Comment: BIOCHEMICALS MAY BE AFFECTED BY COLOR   APPearance CLOUDY (A) CLEAR   Specific Gravity, Urine 1.021 1.005 - 1.030   pH 5.0 5.0 - 8.0   Glucose, UA NEGATIVE NEGATIVE mg/dL   Hgb urine dipstick SMALL (A) NEGATIVE   Bilirubin Urine NEGATIVE NEGATIVE   Ketones, ur NEGATIVE NEGATIVE mg/dL   Protein, ur 100 (A) NEGATIVE mg/dL   Nitrite NEGATIVE NEGATIVE   Leukocytes,Ua NEGATIVE NEGATIVE   RBC / HPF 6-10 0 - 5 RBC/hpf   WBC, UA 6-10 0 - 5 WBC/hpf   Bacteria, UA FEW (A) NONE SEEN   Squamous Epithelial / LPF 0-5 0 - 5   Mucus PRESENT    Granular Casts, UA PRESENT    Amorphous Crystal PRESENT     Comment: Performed at [facility], [address], [place], [place] [address]  CBC with Differential     Status: Abnormal   Collection Time: 05/01/19  6:33 PM  Result Value Ref Range   WBC 11.8 (H) 4.0 - 10.5 K/uL   RBC 4.06 3.87 - 5.11 MIL/uL   Hemoglobin 13.3 12.0 - 15.0 g/dL   HCT 38.3  36.0 - 46.0 %   MCV 94.3 80.0 - 100.0 fL   MCH 32.8 26.0 - 34.0 pg   MCHC 34.7 30.0 - 36.0 g/dL   RDW 13.2 11.5 - 15.5 %   Platelets 313 150 - 400 K/uL   nRBC 0.0 0.0 - 0.2 %   Neutrophils Relative % 82 %   Neutro Abs 9.7 (H) 1.7 - 7.7 K/uL   Lymphocytes Relative 11 %  Lymphs Abs 1.3 0.7 - 4.0 K/uL   Monocytes Relative 5 %   Monocytes Absolute 0.6 0.1 - 1.0 K/uL   Eosinophils Relative 0 %   Eosinophils Absolute 0.0 0.0 - 0.5 K/uL   Basophils Relative 1 %   Basophils Absolute 0.1 0.0 - 0.1 K/uL   WBC Morphology TOXIC GRANULATION    Immature Granulocytes 1 %   Abs Immature Granulocytes 0.17 (H) 0.00 - 0.07 K/uL    Comment: Performed at [facility], [address], [place], [place] [address]  Comprehensive metabolic panel     Status: Abnormal   Collection Time: 05/01/19  6:33 PM  Result Value Ref Range   Sodium 130 (L) 135 - 145 mmol/L   Potassium 3.2 (L) 3.5 - 5.1 mmol/L   Chloride 98 98 - 111 mmol/L   CO2 15 (L) 22 - 32 mmol/L   Glucose, Bld 135 (H) 70 - 99 mg/dL   BUN 39 (H) 8 - 23 mg/dL   Creatinine, Ser 2.15 (H) 0.44 - 1.00 mg/dL   Calcium 8.4 (L) 8.9 - 10.3 mg/dL   Total Protein 7.0 6.5 - 8.1 g/dL   Albumin 2.8 (L) 3.5 - 5.0 g/dL   AST 33 15 - 41 U/L   ALT 24 0 - 44 U/L   Alkaline Phosphatase 120 38 - 126 U/L   Total Bilirubin 0.2 (L) 0.3 - 1.2 mg/dL   GFR calc non Af Amer 24 (L) >60 mL/min   GFR calc Af Amer 28 (L) >60 mL/min   Anion gap 17 (H) 5 - 15    Comment: Performed at [facility], [address] [place], [place] [address]  Sedimentation rate     Status: Abnormal   Collection Time: 05/01/19  6:33 PM  Result Value Ref Range   Sed Rate 108 (H) 0 - 22 mm/hr    Comment: Performed at [facility], [address], [place], [place] [address]  C-reactive protein     Status: Abnormal   Collection Time: 05/01/19  6:33 PM  Result Value Ref Range   CRP 48.0 (H) <1.0 mg/dL    Comment: RESULTS CONFIRMED BY MANUAL DILUTION Performed at [facility], [address], [place], [place] [address]   Lactic acid, plasma     Status: Abnormal   Collection Time: 05/01/19  6:36 PM  Result Value Ref Range   Lactic Acid, Venous 2.5 (HH) 0.5 - 1.9 mmol/L    Comment: CRITICAL RESULT CALLED TO, READ BACK BY AND VERIFIED WITH: WALKER,T ON 05/01/19 AT 1940 BY LOY,C Performed at [facility], [address], [place], [place] [address]   Blood culture (routine x 2)     Status: None (Preliminary result)   Collection Time: 05/01/19  8:30 PM  Result Value Ref Range   Specimen Description BLOOD RIGHT ARM    Special Requests      BOTTLES DRAWN AEROBIC AND ANAEROBIC Blood Culture adequate volume   Culture      NO GROWTH < 12 HOURS Performed at [facility], [address] [place],  [address]    Report Status PENDING   Blood culture (routine x 2)     Status: None (Preliminary result)   Collection Time: 05/01/19  8:31 PM  Result Value Ref Range   Specimen Description BLOOD LEFT ARM    Special Requests      BOTTLES DRAWN AEROBIC AND ANAEROBIC Blood Culture adequate volume   Culture      NO GROWTH < 12 HOURS  Performed at [facility], [address], [place], [place] [address]    Report Status PENDING   SARS Coronavirus 2 (CEPHEID- Performed in [place] hospital lab), Hosp Order     Status: None   Collection Time: 05/01/19  8:36 PM  Result Value Ref Range   SARS Coronavirus 2 NEGATIVE NEGATIVE    Comment: (NOTE) If result is NEGATIVE SARS-CoV-2 target nucleic acids are NOT DETECTED. The SARS-CoV-2 RNA is generally detectable in upper and lower  respiratory specimens during the acute phase of infection. The lowest  concentration of SARS-CoV-2 viral copies this assay can detect is 250  copies / mL. A negative result does not preclude SARS-CoV-2 infection  and should not be used as the sole basis for treatment or other  patient management decisions.  A negative result may occur with  improper specimen collection / handling, submission of specimen other  than  nasopharyngeal swab, presence of viral mutation(s) within the  areas targeted by this assay, and inadequate number of viral copies  (<250 copies / mL). A negative result must be combined with clinical  observations, patient history, and epidemiological information. If result is POSITIVE SARS-CoV-2 target nucleic acids are DETECTED. The SARS-CoV-2 RNA is generally detectable in upper and lower  respiratory specimens dur ing the acute phase of infection.  Positive  results are indicative of active infection with SARS-CoV-2.  Clinical  correlation with patient history and other diagnostic information is  necessary to determine patient infection status.  Positive results do  not rule out bacterial infection or co-infection with other viruses. If result is PRESUMPTIVE POSTIVE SARS-CoV-2 nucleic acids MAY BE PRESENT.   A presumptive positive result was obtained on the submitted specimen  and confirmed on repeat testing.  While 2019 novel coronavirus  (SARS-CoV-2) nucleic acids may be present in the submitted sample  additional confirmatory testing may be necessary for epidemiological  and / or clinical management purposes  to differentiate between  SARS-CoV-2 and other Sarbecovirus currently known to infect humans.  If clinically indicated additional testing with an alternate test  methodology [phone]) is advised. The SARS-CoV-2 RNA is generally  detectable in upper and lower respiratory sp ecimens during the acute  phase of infection. The expected result is Negative. Fact Sheet for Patients:  StrictlyIdeas.no Fact Sheet for Healthcare Providers: BankingDealers.co.za This test is not yet approved or cleared by the [place] FDA and has been authorized for detection and/or diagnosis of SARS-CoV-2 by FDA under an Emergency Use Authorization (EUA).  This EUA will remain in effect (meaning this test can be used) for the duration of  the COVID-19 declaration under Section 564(b)(1) of the Act, 21 U.S.C. section 360bbb-3(b)(1), unless the authorization is terminated or revoked sooner. Performed at [facility], [address] [place], [place] [address]   Sodium, urine, random     Status: None   Collection Time: 05/02/19  4:42 AM  Result Value Ref Range   Sodium, Ur 17 mmol/L    Comment: Performed at [facility], [address], [place], [place] [address]  Creatinine, urine, random     Status: None   Collection Time: 05/02/19  4:42 AM  Result Value Ref Range   Creatinine, Urine 125.01 mg/dL    Comment: Performed at [facility], [address], [place], [place] [address]  Strep pneumoniae urinary antigen     Status: Abnormal   Collection Time: 05/02/19  4:42 AM  Result Value Ref Range   Strep Pneumo Urinary Antigen POSITIVE (  A) NEGATIVE    Comment: Performed at [facility], [place] [address], [place], [place] [address]  CBC     Status: Abnormal   Collection Time: 05/02/19  5:09 AM  Result Value Ref Range   WBC 12.0 (H) 4.0 - 10.5 K/uL   RBC 4.04 3.87 - 5.11 MIL/uL   Hemoglobin 12.9 12.0 - 15.0 g/dL   HCT 39.1 36.0 - 46.0 %   MCV 96.8 80.0 - 100.0 fL   MCH 31.9 26.0 - 34.0 pg   MCHC 33.0 30.0 - 36.0 g/dL   RDW 13.1 11.5 - 15.5 %   Platelets 346 150 - 400 K/uL   nRBC 0.0 0.0 - 0.2 %    Comment: Performed at [facility], [address], [place], [place] [address]  Comprehensive metabolic panel     Status: Abnormal   Collection Time: 05/02/19  5:09 AM  Result Value Ref Range   Sodium 137 135 - 145 mmol/L    Comment: DELTA CHECK NOTED   Potassium 3.2 (L) 3.5 - 5.1 mmol/L   Chloride 102 98 - 111 mmol/L   CO2 20 (L) 22 - 32 mmol/L   Glucose, Bld 110 (H) 70 - 99 mg/dL   BUN 34 (H) 8 - 23 mg/dL   Creatinine, Ser 1.60 (H) 0.44 - 1.00 mg/dL   Calcium 8.3 (L) 8.9 - 10.3 mg/dL   Total Protein 6.6 6.5 - 8.1 g/dL   Albumin 2.6 (L) 3.5 - 5.0 g/dL   AST 30 15 - 41 U/L   ALT 22 0 - 44 U/L   Alkaline Phosphatase  120 38 - 126 U/L   Total Bilirubin 0.3 0.3 - 1.2 mg/dL   GFR calc non Af Amer 34 (L) >60 mL/min   GFR calc Af Amer 39 (L) >60 mL/min   Anion gap 15 5 - 15    Comment: Performed at [facility], [address], [place], [place] [address]  Basic metabolic panel     Status: Abnormal   Collection Time: 05/03/19  6:48 AM  Result Value Ref Range   Sodium 141 135 - 145 mmol/L   Potassium 3.2 (L) 3.5 - 5.1 mmol/L   Chloride 109 98 - 111 mmol/L   CO2 22 22 - 32 mmol/L   Glucose, Bld 103 (H) 70 - 99 mg/dL   BUN 21 8 - 23 mg/dL   Creatinine, Ser 1.05 (H) 0.44 - 1.00 mg/dL   Calcium 8.2 (L) 8.9 - 10.3 mg/dL   GFR calc non Af Amer 56 (L) >60 mL/min   GFR calc Af Amer >60 >60 mL/min   Anion gap 10 5 - 15    Comment: Performed at [facility], [address], [place], [place] [address]  CBC     Status: Abnormal   Collection Time: 05/03/19  6:48 AM  Result Value Ref Range   WBC 8.7 4.0 - 10.5 K/uL   RBC 3.76 (L) 3.87 - 5.11 MIL/uL   Hemoglobin 12.2 12.0 - 15.0 g/dL   HCT 36.4 36.0 - 46.0 %   MCV 96.8 80.0 - 100.0 fL   MCH 32.4 26.0 - 34.0 pg   MCHC 33.5 30.0 - 36.0 g/dL   RDW 13.8 11.5 - 15.5 %   Platelets 377 150 - 400 K/uL   nRBC 0.0 0.0 - 0.2 %    Comment: Performed at [facility], [address], [place], [place] [address]  Magnesium     Status: None   Collection Time:  05/03/19  6:48 AM  Result Value Ref Range   Magnesium 2.3 1.7 - 2.4 mg/dL    Comment: Performed at [facility], [address] [place], [place] [address]    Dg Chest 2 View  Result Date: 05/01/2019 CLINICAL DATA:  Fever. EXAM: CHEST - 2 VIEW COMPARISON:  Radiograph May 28, 2016. FINDINGS: The heart size and mediastinal contours are within normal limits. No pneumothorax or pleural effusion is noted. Left lung is clear. Large rounded density is seen in the right lower lobe concerning for possible malignancy or pneumonia. The visualized skeletal structures are unremarkable. IMPRESSION: Large rounded density seen in right  lower lobe concerning for pneumonia or malignancy. CT scan of the chest with intravenous contrast is recommended for further evaluation. Electronically Signed   By: Marijo Conception M.D.   On: 05/01/2019 19:31   Ct Head Wo Contrast  Result Date: 05/01/2019 CLINICAL DATA:  Headache EXAM: CT HEAD WITHOUT CONTRAST TECHNIQUE: Contiguous axial images were obtained from the base of the skull through the vertex without intravenous contrast. COMPARISON:  07/25/2006 FINDINGS: Brain: There is no mass, hemorrhage or extra-axial collection. The size and configuration of the ventricles and extra-axial CSF spaces are normal. There is hypoattenuation of the white matter, most commonly indicating chronic small vessel disease. Vascular: No abnormal hyperdensity of the major intracranial arteries or dural venous sinuses. No intracranial atherosclerosis. Skull: The visualized skull base, calvarium and extracranial soft tissues are normal. Sinuses/Orbits: No fluid levels or advanced mucosal thickening of the visualized paranasal sinuses. No mastoid or middle ear effusion. The orbits are normal. IMPRESSION: Mild chronic small vessel disease without acute intracranial abnormality. Electronically Signed   By: Ulyses Jarred M.D.   On: 05/01/2019 23:44   Ct Chest Wo Contrast  Result Date: 05/01/2019 CLINICAL DATA:  Unresolved complicated pneumonia. EXAM: CT CHEST WITHOUT CONTRAST TECHNIQUE: Multidetector CT imaging of the chest was performed following the standard protocol without IV contrast. COMPARISON:  CT of the pelvis dated 01/25/2006 FINDINGS: Cardiovascular: There is an aberrant right subclavian artery, a normal variant. Aortic calcifications are noted. The heart size is normal. There are a few coronary artery calcifications. There is no significant pericardial effusion. Mediastinum/Nodes: There are prominent mediastinal mediastinal and hilar lymph nodes. No significant axillary adenopathy. No pathologically enlarged  supraclavicular lymph nodes. Lungs/Pleura: There is consolidation involving approximately 50% of the right lower lobe. There is no pneumothorax. No large pleural effusion. Upper Abdomen: No acute abnormality. Musculoskeletal: There is a chronic appearing fracture involving the T12 vertebral body. There is no acute displaced fracture identified on today's exam. IMPRESSION: 1. Right lower lobe pneumonia. Follow-up to radiologic resolution is recommended. 2. Prominent mediastinal and hilar lymph nodes, presumably reactive. 3. Incidentally noted aberrant right subclavian artery, a normal variant. Aortic Atherosclerosis (ICD10-I70.0). Electronically Signed   By: Constance Holster M.D.   On: 05/01/2019 23:33   US Carotid Bilateral  Result Date: 05/02/2019 CLINICAL DATA:  63 year old female with periorbital headache EXAM: BILATERAL CAROTID DUPLEX ULTRASOUND TECHNIQUE: Pearline Cables scale imaging, color Doppler and duplex ultrasound were performed of bilateral carotid and vertebral arteries in the neck. COMPARISON:  None. FINDINGS: Criteria: Quantification of carotid stenosis is based on velocity parameters that correlate the residual internal carotid diameter with NASCET-based stenosis levels, using the diameter of the distal internal carotid lumen as the denominator for stenosis measurement. The following velocity measurements were obtained: RIGHT ICA: 114/26 cm/sec CCA: 563/14 cm/sec SYSTOLIC ICA/CCA RATIO:  1.1 ECA:  107 cm/sec LEFT ICA: 116/29 cm/sec  CCA: 791/50 cm/sec SYSTOLIC ICA/CCA RATIO:  1.1 ECA:  113 cm/sec RIGHT CAROTID ARTERY: Mild heterogeneous atherosclerotic plaque in the proximal internal carotid artery. By peak systolic velocity criteria, the estimated stenosis remains less than 50%. RIGHT VERTEBRAL ARTERY:  Patent with normal antegrade flow. LEFT CAROTID ARTERY: Mild smooth heterogeneous atherosclerotic plaque in the proximal and mid internal carotid artery. By peak systolic velocity criteria, the estimated  stenosis remains less than 50%. LEFT VERTEBRAL ARTERY:  Patent with normal antegrade flow. IMPRESSION: 1. Mild (1-49%) stenosis proximal right internal carotid artery secondary to heterogenous atherosclerotic plaque. 2. Mild (1-49%) stenosis proximal left internal carotid artery secondary to heterogenous atherosclerotic plaque. 3. The vertebral arteries are patent with normal antegrade flow. Signed, Criselda Peaches, MD, [facility] Electronically Signed   By: Jacqulynn Cadet M.D.   On: 05/02/2019 15:04    Assessment & Plan:  RYANN PAULI is a 63 y.o. female with no obvious pathology on imaging for her headache or her right eye vision loss. Dr. Merlene Laughter has evaluated her and requested a temporal artery biopsy. I have discussed with the patient that these biopsies are often non diagnostic and that the treatment for temporal arteritis is steroids, and that she will likely receive the treatment regardless of the pathology. We have discussed that it is more of a diagnoses to exclude.  We have discussed the option of local on biopsy versus sedation and local. She has opted for a local biopsy which can be done today.   We discussed the risk of bleeding, infection, injury to nerves that give sensation and control the eyebrow. She has opted to proceed.   -Temporal artery biopsy with local in the minor procedure room at about 11 AM  -No need to be NPO as this is a local only case   All questions were answered to the satisfaction of the patient.   Virl Cagey 05/03/2019, 7:40 AM

## 2019-05-03 NOTE — Progress Notes (Signed)
[facility]  Temporal artery biopsy completed. I will call patient with results and send to Dr. Merlene Laughter. Patient to keep the area clean. She can shower per her normal routine.  Pain meds PRN.   No in person follow up needed as long as patient doing well when I call her.  Curlene Labrum, MD [facility] [address] [place], [place] [address] [phone] (office)

## 2019-05-03 NOTE — Progress Notes (Signed)
Here for temporal artery bx. Vital signs obtained. Gold watch removed. Voices no c/o at this time.

## 2019-05-03 NOTE — TOC Initial Note (Signed)
Transition of Care [facility]) - Initial/Assessment Note    Patient Details  Name: [name] MRN: [id number] Date of Birth: 23-Aug-1956  Transition of [facility]) CM/SW Contact:    Ihor Gully, LCSW Phone Number: 05/03/2019, 3:45 PM  Clinical Narrative:                 Patient advised that she lives with spouse, does not have any insurance since she retired at age 63. At baseline, she is independent and drives. She is interested in being referred to [facility].   Referral made to [facility]. Appointment scheduled for Monday, May 07, 2019 @ 10:00 a.m. Patient provided appointment date, time, location, as well as a flyer with documents she will need to bring. This was all highlighted on the flyer.    Expected Discharge Plan: Home/Self Care Barriers to Discharge: No Barriers Identified   Patient Goals and CMS Choice        Expected Discharge Plan and Services Expected Discharge Plan: Home/Self Care       Living arrangements for the past 2 months: Single Family Home                                      Prior Living Arrangements/Services Living arrangements for the past 2 months: Single Family Home Lives with:: Spouse Patient language and need for interpreter reviewed:: Yes Do you feel safe going back to the place where you live?: Yes      Need for Family Participation in Patient Care: Yes (Comment) Care giver support system in place?: Yes (comment)   Criminal Activity/Legal Involvement Pertinent to Current Situation/Hospitalization: No - Comment as needed  Activities of Daily Living Home Assistive Devices/Equipment: None ADL Screening (condition at time of admission) Patient's cognitive ability adequate to safely complete daily activities?: Yes Is the patient deaf or have difficulty hearing?: No Does the patient have difficulty seeing, even when wearing glasses/contacts?: Yes Does the patient have difficulty concentrating, remembering, or making  decisions?: No Patient able to express need for assistance with ADLs?: No Does the patient have difficulty dressing or bathing?: No Independently performs ADLs?: Yes (appropriate for developmental age) Does the patient have difficulty walking or climbing stairs?: No Weakness of Legs: None Weakness of Arms/Hands: None  Permission Sought/Granted                  Emotional Assessment Appearance:: Appears stated age     Orientation: : Oriented to Self, Oriented to Place, Oriented to  Time, Oriented to Situation Alcohol / Substance Use: Not Applicable Psych Involvement: No (comment)  Admission diagnosis:  AKI (acute kidney injury) ([place]) [N17.9] Nonintractable headache, unspecified chronicity pattern, unspecified headache type [R51] Community acquired pneumonia of right lower lobe of lung ([place]) [J18.1] Patient Active Problem List   Diagnosis Date Noted  . CAP (community acquired pneumonia) 05/02/2019  . Lobar pneumonia ([place]) 05/02/2019  . AKI (acute kidney injury) ([place])   . Nonintractable headache   . Hip fracture, left ([place]) 05/28/2016  . Hypertension 05/28/2016  . Hypokalemia 05/28/2016  . Closed left hip fracture ([place]) 05/28/2016  . CARCINOMA, SKIN, SQUAMOUS CELL 06/05/2009  . RESTLESS LEG SYNDROME 06/06/2007  . GERD 01/27/2007  . HYPERLIPIDEMIA 10/19/2006  . TOBACCO ABUSE 10/19/2006  . Coronary atherosclerosis 10/19/2006  . BRONCHITIS, CHRONIC NOS 10/19/2006  . ASTHMA 10/19/2006  . LOW BACK PAIN 10/19/2006  . HYPERGLYCEMIA 10/19/2006  PCP:  Patient, No Pcp Per Pharmacy:   [id number] - [place], [address] [place] [address] Phone: [phone] Fax: [phone]     Social Determinants of Health (SDOH) Interventions    Readmission Risk Interventions No flowsheet data found.

## 2019-05-03 NOTE — Op Note (Signed)
[facility] Operative Note  05/03/19  Preoperative Diagnosis: Headaches, right temporal   Postoperative Diagnosis: Same   Procedure(s) Performed: Right temporal artery biopsy    Surgeon: Lanell Matar. Constance Haw, MD   Assistants: No qualified resident was available    Anesthesia: Local 1% lidocaine    Specimens: Temporal artery sent fresh    Estimated Blood Loss: Minimal   Blood Replacement: None    Complications: None   Wound Class: Clean    Operative Indications: Ms. [name] is a 63 yo who has been admitted to the hospital with headaches on her right side that have been progressively getting worse. She has been worked up and seen by Dr. Merlene Laughter, and he recommend a temporal artery biopsy on the right.  We discussed the risk of surgery including bleeding, infection, nerve injury, inconclusive or negative biopsy, and need to treat with steroids regardless of the biopsy findings.    Findings: Normal anatomy    Procedure: The patient was taken to the procedure room.  Her right temporal area was prepped and draped in the usual sterile fashion. Lidocaine 1% was injected over the pulsating area in the temporal area.  An incision was made overlying the artery through the skin and carefully carried down through the subcutaneous tissue. The temporal artery was identified in the superficial layers of the superficial temporal fascia and blunt dissection was used to isolate the vessel.  Electrocautery was used for hemostasis.  The vessel was isolated and suture ligated proximally and distally with a 3-0 Vicryl suture. The artery was very small but I could not identify a larger tributary from this branch.  The vessel was divided and sent to pathology fresh.  Final inspection revealed acceptable hemostasis. The subcutaneous tissue as closed with a 3-0 Vicryl suture and the skin was closed with a running 4-0 Vicryl and dermabond.  All counts were correct at the end of the case.  The  patient tolerated the procedure well and was taken back upstairs to her room in stable condition.     Curlene Labrum, MD [facility] [address] [place], [place] [address] [phone] (office)

## 2019-05-04 ENCOUNTER — Encounter (HOSPITAL_COMMUNITY): Payer: Self-pay | Admitting: General Surgery

## 2019-05-04 LAB — BASIC METABOLIC PANEL
Anion gap: 13 (ref 5–15)
BUN: 16 mg/dL (ref 8–23)
CO2: 20 mmol/L — ABNORMAL LOW (ref 22–32)
Calcium: 8.5 mg/dL — ABNORMAL LOW (ref 8.9–10.3)
Chloride: 111 mmol/L (ref 98–111)
Creatinine, Ser: 0.81 mg/dL (ref 0.44–1.00)
GFR calc Af Amer: >60 mL/min (ref >60)
GFR calc non Af Amer: >60 mL/min (ref >60)
Glucose, Bld: 138 mg/dL — ABNORMAL HIGH (ref 70–99)
Potassium: 3.9 mmol/L (ref 3.5–5.1)
Sodium: 144 mmol/L (ref 135–145)

## 2019-05-04 MED ORDER — PREDNISONE 10 MG PO TABS: 40.0000 mg | ORAL_TABLET | Freq: Two times a day (BID) | ORAL | 0 refills | Status: DC

## 2019-05-04 MED ORDER — LEVOFLOXACIN 500 MG PO TABS
500.0000 mg | ORAL_TABLET | Freq: Every day | ORAL | Status: DC
  Administered 2019-05-04: 500 mg via ORAL
  Filled 2019-05-04: qty 1

## 2019-05-04 MED ORDER — PREDNISONE 20 MG PO TABS: 40.0000 mg | ORAL_TABLET | Freq: Two times a day (BID) | ORAL | Status: DC

## 2019-05-04 MED ORDER — LEVOFLOXACIN 500 MG PO TABS: 500.0000 mg | ORAL_TABLET | Freq: Every day | ORAL | 0 refills | Status: DC

## 2019-05-04 NOTE — Progress Notes (Signed)
IV removed and discharge instructions reviewed.  Called family for ride home.

## 2019-05-04 NOTE — Discharge Summary (Signed)
Physician Discharge Summary  EMALIA WITKOP GUY:[id number] DOB: [birth year] DOA: 05/01/2019  PCP: Patient, No Pcp Per  Admit date: 05/01/2019 Discharge date: 05/04/2019  Admitted From: Home Disposition:  Home   Recommendations for Outpatient Follow-up:  1. Follow up with PCP in 1-2 weeks 2. Please obtain BMP/CBC in one week     Discharge Condition: Stable CODE STATUS: FULL Diet recommendation: Heart Healthy   Brief/Interim Summary: 63 year old female with a history of coronary disease, hyperlipidemia, tobacco abuse presenting with a chief complaint of right forehead headache. The patient states that she has had this headache for almost 4 years. The patient is a difficult historian, but from what is gathered from the clinical history it appears that the headache has been episodic waxing and waning over the last 4 years. However in the last 2 to 3 months, she has noted increasing frequency. She describes some allodynia to the right frontoparietal area with intermittent sharp pains. The patient has been taking acetaminophen for pain. 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 She has been attributing her blindness to cataracts. She stated that she saw either an optometrist or ophthalmologist 4 years ago, but cannot clarify any additional work-up other than the fact that she had a cataract. On time of admission, the patient was noted to have a fever 101.2 F. As result, chest x-ray was obtained and showed right lower lobe opacity. CT of the chest subsequently showed consolidation in the right lower lobe with mediastinal lymphadenopathy. The patient was started on Rocephin and azithromycin.  Discharge Diagnoses:  Lobar pneumonia -Check procalcitonin--0.76 -Continue ceftriaxone and azithromycin -urine S. Pneumoniae antigen positive -d/c home with levofloxacin x 4 more days to complete one week of tx -Repeat chest  x-ray in 1 month  Right frontal parietal headache -Check ESR--108 -CRP--48.0 -Cannot rule out temporal arteritis -Consult general surgery for biopsy-->biopsied 6/4 -started prednisone---> home with 40 mg bid x 1 week, then 30 mg bid x 1 week, then 20 mg bid x 1 week, then 10 mg bid x 1 week, then 10 mg daily x 1 week -after starting prednisone pt stated that pain was improving  Acute kidney injury -Baseline creatinine 0.7-0.9 -Serum creatinine peaked 2.5 -Continue IV fluids-->improved -serum creatinine 0.81 on day of dc  Hypokalemia -Replete -Check magnesium--2.3  Tobacco Abuse -I have discussed tobacco cessation with the patient. I have counseled the patient regarding the negative impacts of continued tobacco use including but not limited to lung cancer, COPD, and cardiovascular disease. I have discussed alternatives to tobacco and modalities that may help facilitate tobacco cessation including but not limited to biofeedback, hypnosis, and medications. Total time spent with tobacco counseling was 4 minutes.  Coronary Artery Disease -no chest pain -continue ASA    Discharge Instructions   Allergies as of 05/04/2019      Reactions   Cortisone    REACTION: Fluid retention and hives      Medication List    STOP taking these medications   acetaminophen 325 MG tablet Commonly known as:  TYLENOL   methocarbamol 500 MG tablet Commonly known as:  ROBAXIN   oxyCODONE-acetaminophen 5-325 MG tablet Commonly known as:  Roxicet   pantoprazole 40 MG tablet Commonly known as:  Protonix     TAKE these medications   aspirin 325 MG EC tablet Take 1 tablet (325 mg total) by mouth 2 (two) times daily after a meal.   levofloxacin 500 MG  tablet Commonly known as:  LEVAQUIN Take 1 tablet (500 mg total) by mouth daily.   predniSONE 10 MG tablet Commonly known as:  DELTASONE Take 4 tablets (40 mg total) by mouth 2 (two) times daily with a meal. X 7 days.  Then 30 mg (3  tabs) two times daily x 7 days.  Then 20 mg (2 tabs) two times daily x 7 days.  Then 10 mg (1 tab) two times daily x 7 days.  Then 10 mg (1 tab) daily x 7 days. Start taking on:  May 05, 2019      Follow-up Information    Virl Cagey, MD.   Specialty:  General Surgery Why:  As needed; Dr. Constance Haw will call you with your biopsy results and check on you. If you need to be seen in person or have a concern please call the office. Contact information: [address] [facility] [address] [phone]        [facility]. Go in 4 day(s).   Why:  Appointment to establish primary care physician care on Monday, May 07, 2019, at 10:00 a.m. Contact information: [place], [place] [address]  [phone]         Allergies  Allergen Reactions   Cortisone     REACTION: Fluid retention and hives    Consultations:  General surgery   Procedures/Studies: Dg Chest 2 View  Result Date: 05/01/2019 CLINICAL DATA:  Fever. EXAM: CHEST - 2 VIEW COMPARISON:  Radiograph May 28, 2016. FINDINGS: The heart size and mediastinal contours are within normal limits. No pneumothorax or pleural effusion is noted. Left lung is clear. Large rounded density is seen in the right lower lobe concerning for possible malignancy or pneumonia. The visualized skeletal structures are unremarkable. IMPRESSION: Large rounded density seen in right lower lobe concerning for pneumonia or malignancy. CT scan of the chest with intravenous contrast is recommended for further evaluation. Electronically Signed   By: Marijo Conception M.D.   On: 05/01/2019 19:31   Ct Head Wo Contrast  Result Date: 05/01/2019 CLINICAL DATA:  Headache EXAM: CT HEAD WITHOUT CONTRAST TECHNIQUE: Contiguous axial images were obtained from the base of the skull through the vertex without intravenous contrast. COMPARISON:  07/25/2006 FINDINGS: Brain: There is no mass, hemorrhage or extra-axial collection. The size  and configuration of the ventricles and extra-axial CSF spaces are normal. There is hypoattenuation of the white matter, most commonly indicating chronic small vessel disease. Vascular: No abnormal hyperdensity of the major intracranial arteries or dural venous sinuses. No intracranial atherosclerosis. Skull: The visualized skull base, calvarium and extracranial soft tissues are normal. Sinuses/Orbits: No fluid levels or advanced mucosal thickening of the visualized paranasal sinuses. No mastoid or middle ear effusion. The orbits are normal. IMPRESSION: Mild chronic small vessel disease without acute intracranial abnormality. Electronically Signed   By: Ulyses Jarred M.D.   On: 05/01/2019 23:44   Ct Chest Wo Contrast  Result Date: 05/01/2019 CLINICAL DATA:  Unresolved complicated pneumonia. EXAM: CT CHEST WITHOUT CONTRAST TECHNIQUE: Multidetector CT imaging of the chest was performed following the standard protocol without IV contrast. COMPARISON:  CT of the pelvis dated 01/25/2006 FINDINGS: Cardiovascular: There is an aberrant right subclavian artery, a normal variant. Aortic calcifications are noted. The heart size is normal. There are a few coronary artery calcifications. There is no significant pericardial effusion. Mediastinum/Nodes: There are prominent mediastinal mediastinal and hilar lymph nodes. No significant axillary adenopathy. No pathologically enlarged supraclavicular lymph nodes.  Lungs/Pleura: There is consolidation involving approximately 50% of the right lower lobe. There is no pneumothorax. No large pleural effusion. Upper Abdomen: No acute abnormality. Musculoskeletal: There is a chronic appearing fracture involving the T12 vertebral body. There is no acute displaced fracture identified on today's exam. IMPRESSION: 1. Right lower lobe pneumonia. Follow-up to radiologic resolution is recommended. 2. Prominent mediastinal and hilar lymph nodes, presumably reactive. 3. Incidentally noted aberrant  right subclavian artery, a normal variant. Aortic Atherosclerosis (ICD10-I70.0). Electronically Signed   By: Constance Holster M.D.   On: 05/01/2019 23:33   US Carotid Bilateral  Result Date: 05/02/2019 CLINICAL DATA:  63 year old female with periorbital headache EXAM: BILATERAL CAROTID DUPLEX ULTRASOUND TECHNIQUE: Pearline Cables scale imaging, color Doppler and duplex ultrasound were performed of bilateral carotid and vertebral arteries in the neck. COMPARISON:  None. FINDINGS: Criteria: Quantification of carotid stenosis is based on velocity parameters that correlate the residual internal carotid diameter with NASCET-based stenosis levels, using the diameter of the distal internal carotid lumen as the denominator for stenosis measurement. The following velocity measurements were obtained: RIGHT ICA: 114/26 cm/sec CCA: 542/70 cm/sec SYSTOLIC ICA/CCA RATIO:  1.1 ECA:  107 cm/sec LEFT ICA: 116/29 cm/sec CCA: 623/76 cm/sec SYSTOLIC ICA/CCA RATIO:  1.1 ECA:  113 cm/sec RIGHT CAROTID ARTERY: Mild heterogeneous atherosclerotic plaque in the proximal internal carotid artery. By peak systolic velocity criteria, the estimated stenosis remains less than 50%. RIGHT VERTEBRAL ARTERY:  Patent with normal antegrade flow. LEFT CAROTID ARTERY: Mild smooth heterogeneous atherosclerotic plaque in the proximal and mid internal carotid artery. By peak systolic velocity criteria, the estimated stenosis remains less than 50%. LEFT VERTEBRAL ARTERY:  Patent with normal antegrade flow. IMPRESSION: 1. Mild (1-49%) stenosis proximal right internal carotid artery secondary to heterogenous atherosclerotic plaque. 2. Mild (1-49%) stenosis proximal left internal carotid artery secondary to heterogenous atherosclerotic plaque. 3. The vertebral arteries are patent with normal antegrade flow. Signed, Criselda Peaches, MD, [facility] Electronically Signed   By: Jacqulynn Cadet M.D.    On: 05/02/2019 15:04        Discharge Exam: Vitals:   05/03/19 1442 05/03/19 2158  BP: 111/69 (!) 113/53  Pulse: 64 63  Resp: 18 16  Temp: 98.9 F (37.2 C) 97.9 F (36.6 C)  SpO2:  98%   Vitals:   05/03/19 0526 05/03/19 1437 05/03/19 1442 05/03/19 2158  BP: (!) 111/57 (!) 109/59 111/69 (!) 113/53  Pulse: 66 66 64 63  Resp: _0 Temp: 98.4 F (36.9 C)  98.9 F (37.2 C) 97.9 F (36.6 C)  TempSrc: Oral  Oral Oral  SpO2: 98% 98%  98%  Weight:      Height:        General: Pt is alert, awake, not in acute distress Cardiovascular: RRR, S1/S2 +, no rubs, no gallops Respiratory: right basilar crackles, no wheeze Abdominal: Soft, NT, ND, bowel sounds + Extremities: no edema, no cyanosis   The results of significant diagnostics from this hospitalization (including imaging, microbiology, ancillary and laboratory) are listed below for reference.    Significant Diagnostic Studies: Dg Chest 2 View  Result Date: 05/01/2019 CLINICAL DATA:  Fever. EXAM: CHEST - 2 VIEW COMPARISON:  Radiograph May 28, 2016. FINDINGS: The heart size and mediastinal contours are within normal limits. No pneumothorax or pleural effusion is noted. Left lung is clear. Large rounded density is seen in the right lower lobe concerning for possible malignancy or pneumonia. The visualized skeletal structures  are unremarkable. IMPRESSION: Large rounded density seen in right lower lobe concerning for pneumonia or malignancy. CT scan of the chest with intravenous contrast is recommended for further evaluation. Electronically Signed   By: Marijo Conception M.D.   On: 05/01/2019 19:31   Ct Head Wo Contrast  Result Date: 05/01/2019 CLINICAL DATA:  Headache EXAM: CT HEAD WITHOUT CONTRAST TECHNIQUE: Contiguous axial images were obtained from the base of the skull through the vertex without intravenous contrast. COMPARISON:  07/25/2006 FINDINGS: Brain: There is no mass, hemorrhage or extra-axial collection. The size  and configuration of the ventricles and extra-axial CSF spaces are normal. There is hypoattenuation of the white matter, most commonly indicating chronic small vessel disease. Vascular: No abnormal hyperdensity of the major intracranial arteries or dural venous sinuses. No intracranial atherosclerosis. Skull: The visualized skull base, calvarium and extracranial soft tissues are normal. Sinuses/Orbits: No fluid levels or advanced mucosal thickening of the visualized paranasal sinuses. No mastoid or middle ear effusion. The orbits are normal. IMPRESSION: Mild chronic small vessel disease without acute intracranial abnormality. Electronically Signed   By: Ulyses Jarred M.D.   On: 05/01/2019 23:44   Ct Chest Wo Contrast  Result Date: 05/01/2019 CLINICAL DATA:  Unresolved complicated pneumonia. EXAM: CT CHEST WITHOUT CONTRAST TECHNIQUE: Multidetector CT imaging of the chest was performed following the standard protocol without IV contrast. COMPARISON:  CT of the pelvis dated 01/25/2006 FINDINGS: Cardiovascular: There is an aberrant right subclavian artery, a normal variant. Aortic calcifications are noted. The heart size is normal. There are a few coronary artery calcifications. There is no significant pericardial effusion. Mediastinum/Nodes: There are prominent mediastinal mediastinal and hilar lymph nodes. No significant axillary adenopathy. No pathologically enlarged supraclavicular lymph nodes. Lungs/Pleura: There is consolidation involving approximately 50% of the right lower lobe. There is no pneumothorax. No large pleural effusion. Upper Abdomen: No acute abnormality. Musculoskeletal: There is a chronic appearing fracture involving the T12 vertebral body. There is no acute displaced fracture identified on today's exam. IMPRESSION: 1. Right lower lobe pneumonia. Follow-up to radiologic resolution is recommended. 2. Prominent mediastinal and hilar lymph nodes, presumably reactive. 3. Incidentally noted aberrant  right subclavian artery, a normal variant. Aortic Atherosclerosis (ICD10-I70.0). Electronically Signed   By: Constance Holster M.D.   On: 05/01/2019 23:33   US Carotid Bilateral  Result Date: 05/02/2019 CLINICAL DATA:  63 year old female with periorbital headache EXAM: BILATERAL CAROTID DUPLEX ULTRASOUND TECHNIQUE: Pearline Cables scale imaging, color Doppler and duplex ultrasound were performed of bilateral carotid and vertebral arteries in the neck. COMPARISON:  None. FINDINGS: Criteria: Quantification of carotid stenosis is based on velocity parameters that correlate the residual internal carotid diameter with NASCET-based stenosis levels, using the diameter of the distal internal carotid lumen as the denominator for stenosis measurement. The following velocity measurements were obtained: RIGHT ICA: 114/26 cm/sec CCA: 761/95 cm/sec SYSTOLIC ICA/CCA RATIO:  1.1 ECA:  107 cm/sec LEFT ICA: 116/29 cm/sec CCA: 093/26 cm/sec SYSTOLIC ICA/CCA RATIO:  1.1 ECA:  113 cm/sec RIGHT CAROTID ARTERY: Mild heterogeneous atherosclerotic plaque in the proximal internal carotid artery. By peak systolic velocity criteria, the estimated stenosis remains less than 50%. RIGHT VERTEBRAL ARTERY:  Patent with normal antegrade flow. LEFT CAROTID ARTERY: Mild smooth heterogeneous atherosclerotic plaque in the proximal and mid internal carotid artery. By peak systolic velocity criteria, the estimated stenosis remains less than 50%. LEFT VERTEBRAL ARTERY:  Patent with normal antegrade flow. IMPRESSION: 1. Mild (1-49%) stenosis proximal right internal carotid artery secondary to heterogenous atherosclerotic plaque. 2. Mild (  1-49%) stenosis proximal left internal carotid artery secondary to heterogenous atherosclerotic plaque. 3. The vertebral arteries are patent with normal antegrade flow. Signed, Criselda Peaches, MD, [facility] Electronically Signed   By: Jacqulynn Cadet M.D.    On: 05/02/2019 15:04     Microbiology: Recent Results (from the past 240 hour(s))  Urine culture     Status: Abnormal   Collection Time: 05/01/19  3:34 PM  Result Value Ref Range Status   Specimen Description   Final    URINE, RANDOM Performed at [facility], [address], [place], [place] [address]    Special Requests   Final    NONE Performed at [facility], [address], [place], [place] [address]    Culture (A)  Final    >=100,000 COLONIES/mL CORYNEBACTERIUM SPECIES Standardized susceptibility testing for this organism is not available. Performed at [facility], [place] [address], [place],  [address]    Report Status 05/03/2019 FINAL  Final  Blood culture (routine x 2)     Status: None (Preliminary result)   Collection Time: 05/01/19  8:30 PM  Result Value Ref Range Status   Specimen Description BLOOD RIGHT ARM  Final   Special Requests   Final    BOTTLES DRAWN AEROBIC AND ANAEROBIC Blood Culture adequate volume   Culture   Final    NO GROWTH 3 DAYS Performed at [facility], [address], [place],  [address]    Report Status PENDING  Incomplete  Blood culture (routine x 2)     Status: None (Preliminary result)   Collection Time: 05/01/19  8:31 PM  Result Value Ref Range Status   Specimen Description BLOOD LEFT ARM  Final   Special Requests   Final    BOTTLES DRAWN AEROBIC AND ANAEROBIC Blood Culture adequate volume   Culture   Final    NO GROWTH 3 DAYS Performed at [facility], [address], [place],  [address]    Report Status PENDING  Incomplete  SARS Coronavirus 2 (CEPHEID- Performed in [place] hospital lab), Hosp Order     Status: None   Collection Time: 05/01/19  8:36 PM  Result Value Ref Range Status   SARS Coronavirus 2 NEGATIVE NEGATIVE Final    Comment: (NOTE) If result is NEGATIVE SARS-CoV-2 target nucleic acids are NOT DETECTED. The SARS-CoV-2 RNA is generally detectable in upper and lower  respiratory specimens  during the acute phase of infection. The lowest  concentration of SARS-CoV-2 viral copies this assay can detect is 250  copies / mL. A negative result does not preclude SARS-CoV-2 infection  and should not be used as the sole basis for treatment or other  patient management decisions.  A negative result may occur with  improper specimen collection / handling, submission of specimen other  than nasopharyngeal swab, presence of viral mutation(s) within the  areas targeted by this assay, and inadequate number of viral copies  (<250 copies / mL). A negative result must be combined with clinical  observations, patient history, and epidemiological information. If result is POSITIVE SARS-CoV-2 target nucleic acids are DETECTED. The SARS-CoV-2 RNA is generally detectable in upper and lower  respiratory specimens dur ing the acute phase of infection.  Positive  results are indicative of active infection with SARS-CoV-2.  Clinical  correlation with patient history and other diagnostic information is  necessary to determine patient infection status.  Positive results do  not rule out  bacterial infection or co-infection with other viruses. If result is PRESUMPTIVE POSTIVE SARS-CoV-2 nucleic acids MAY BE PRESENT.   A presumptive positive result was obtained on the submitted specimen  and confirmed on repeat testing.  While 2019 novel coronavirus  (SARS-CoV-2) nucleic acids may be present in the submitted sample  additional confirmatory testing may be necessary for epidemiological  and / or clinical management purposes  to differentiate between  SARS-CoV-2 and other Sarbecovirus currently known to infect humans.  If clinically indicated additional testing with an alternate test  methodology [phone]) is advised. The SARS-CoV-2 RNA is generally  detectable in upper and lower respiratory sp ecimens during the acute  phase of infection. The expected result is Negative. Fact Sheet for Patients:   StrictlyIdeas.no Fact Sheet for Healthcare Providers: BankingDealers.co.za This test is not yet approved or cleared by the [place] FDA and has been authorized for detection and/or diagnosis of SARS-CoV-2 by FDA under an Emergency Use Authorization (EUA).  This EUA will remain in effect (meaning this test can be used) for the duration of the COVID-19 declaration under Section 564(b)(1) of the Act, 21 U.S.C. section 360bbb-3(b)(1), unless the authorization is terminated or revoked sooner. Performed at [facility], [address], [place], [place] [address]      Labs: Basic Metabolic Panel: Recent Labs  Lab 05/01/19 1833 05/02/19 0509 05/03/19 0648 05/04/19 0639  NA 130* 137 141 144  K 3.2* 3.2* 3.2* 3.9  CL 98 102 109 111  CO2 15* 20* 22 20*  GLUCOSE 135* 110* 103* 138*  BUN 39* 34* 21 16  CREATININE 2.15* 1.60* 1.05* 0.81  CALCIUM 8.4* 8.3* 8.2* 8.5*  MG  --   --  2.3  --    Liver Function Tests: Recent Labs  Lab 05/01/19 1833 05/02/19 0509  AST 33 30  ALT 24 22  ALKPHOS 120 120  BILITOT 0.2* 0.3  PROT 7.0 6.6  ALBUMIN 2.8* 2.6*   No results for input(s): LIPASE, AMYLASE in the last 168 hours. No results for input(s): AMMONIA in the last 168 hours. CBC: Recent Labs  Lab 05/01/19 1833 05/02/19 0509 05/03/19 0648  WBC 11.8* 12.0* 8.7  NEUTROABS 9.7*  --   --   HGB 13.3 12.9 12.2  HCT 38.3 39.1 36.4  MCV 94.3 96.8 96.8  PLT 313 346 377   Cardiac Enzymes: No results for input(s): CKTOTAL, CKMB, CKMBINDEX, TROPONINI in the last 168 hours. BNP: Invalid input(s): POCBNP CBG: No results for input(s): GLUCAP in the last 168 hours.  Time coordinating discharge:  36 minutes  Signed:  Orson Eva, DO [facility] Pager: [phone] 05/04/2019, 9:02 AM

## 2019-05-06 LAB — CULTURE, BLOOD (ROUTINE X 2)
Culture: NO GROWTH
Culture: NO GROWTH
Special Requests: ADEQUATE
Special Requests: ADEQUATE

## 2019-05-07 ENCOUNTER — Telehealth: Payer: Self-pay | Admitting: Physician Assistant

## 2019-05-07 NOTE — Telephone Encounter (Signed)
Pt. Came into [facility] to fill out [facility]. Pt. Needs to bring her and her husband award letter for there [place] before I fax pt. medassist application to [employer].  -Waylan Boga

## 2019-05-09 ENCOUNTER — Telehealth: Payer: Self-pay | Admitting: General Surgery

## 2019-05-09 NOTE — Telephone Encounter (Signed)
[facility]  [facility]  I am calling the patient for post operative evaluation due to the current COVID 19 pandemic.  The patient had a temporal artery biopsy on the right on 05/03/19. The patient reports that they are doing well. Her incision is doing good. Her pathology did not show arteritis.  She has a new PCP 6/17.  Dr. Merlene Laughter aware of pathology.   Will see the patient PRN.   Curlene Labrum, MD [facility] [address] [place], [place] [address] [phone] (office)

## 2019-05-10 ENCOUNTER — Telehealth: Payer: Self-pay | Admitting: Physician Assistant

## 2019-05-16 ENCOUNTER — Encounter: Payer: Self-pay | Admitting: Physician Assistant

## 2019-05-16 ENCOUNTER — Ambulatory Visit: Payer: Self-pay | Admitting: Physician Assistant

## 2019-05-16 DIAGNOSIS — Z7689 Persons encountering health services in other specified circumstances: Secondary | ICD-10-CM

## 2019-05-16 DIAGNOSIS — F17201 Nicotine dependence, unspecified, in remission: Secondary | ICD-10-CM

## 2019-05-16 DIAGNOSIS — I2510 Atherosclerotic heart disease of native coronary artery without angina pectoris: Secondary | ICD-10-CM

## 2019-05-16 DIAGNOSIS — E785 Hyperlipidemia, unspecified: Secondary | ICD-10-CM

## 2019-05-16 DIAGNOSIS — Z1239 Encounter for other screening for malignant neoplasm of breast: Secondary | ICD-10-CM | POA: Insufficient documentation

## 2019-05-16 DIAGNOSIS — Z131 Encounter for screening for diabetes mellitus: Secondary | ICD-10-CM

## 2019-05-16 DIAGNOSIS — R9389 Abnormal findings on diagnostic imaging of other specified body structures: Secondary | ICD-10-CM | POA: Insufficient documentation

## 2019-05-16 DIAGNOSIS — H269 Unspecified cataract: Secondary | ICD-10-CM

## 2019-05-16 NOTE — Progress Notes (Signed)
There were no vitals taken for this visit.   Subjective:    Patient ID: [name], female    DOB: 1956-04-28, 63 y.o.   MRN: [id number]  HPI: [name] is a 63 y.o. female presenting on 05/16/2019 for No chief complaint on file.   HPI   This is a telemedicine visit due to coronavirus pandemic and pt reported to have diarrhea.  The visit is via Telephone as pt could not get the video component of her cell phone to work.  I connected with  [name] on 05/16/19 by a video enabled telemedicine application and verified that I am speaking with the correct person using two identifiers.   I discussed the limitations of evaluation and management by telemedicine. The patient expressed understanding and agreed to proceed.   Pt is at home.  Provider is at office.    Pt is Retired.  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  She says she was seen by cardiology in [facility].  No visit with cardiology in over 5 years.   No chest pains presently) -  Hyperlipidemia  - tobacco abuse - (pt says she quit smoking 05/01/19!)    Pt went to ER for HA on 05/01/19  Pt states HA started 4 year years ago since she got a cataract.  Pt says the HA comes and goes. She takes APAP as needed for the HA.  Pt attributes her HA to the cataract.     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  She says L eye vision normal  Pt says her breathing is great.    Pt says she is weariing a mask when she goes out in public.   Today pt is feeling great.    Pt says diarrhea resolved.  She says it stopped 2 days ago.   Pt felt like it was due to all her medications.     Relevant past medical, surgical, family and social history reviewed and updated as indicated. Interim medical history since our last visit reviewed. Allergies and medications reviewed and updated.   Current Outpatient Medications:  .  aspirin EC 325  MG EC tablet, Take 1 tablet (325 mg total) by mouth 2 (two) times daily after a meal., Disp: 30 tablet, Rfl: 0 .  predniSONE (DELTASONE) 10 MG tablet, Take 4 tablets (40 mg total) by mouth 2 (two) times daily with a meal. X 7 days.  Then 30 mg (3 tabs) two times daily x 7 days.  Then 20 mg (2 tabs) two times daily x 7 days.  Then 10 mg (1 tab) two times daily x 7 days.  Then 10 mg (1 tab) daily x 7 days., Disp: 147 tablet, Rfl: 0    Review of Systems  Per HPI unless specifically indicated above     Objective:    There were no vitals taken for this visit.  Wt Readings from Last 3 Encounters:  05/01/19 175 lb (79.4 kg)  05/28/16 140 lb (63.5 kg)    Physical Exam Neurological:     Mental Status: She is alert and oriented to person, place, and time.  Psychiatric:        Attention and Perception: Attention normal.        Behavior: Behavior is cooperative.           Assessment &  Plan:     Encounter Diagnoses  Name Primary?  . Encounter to establish care   . Hyperlipidemia, unspecified hyperlipidemia type Yes  . Abnormal CXR   . Tobacco abuse, in remission   . Cataract of right eye, unspecified cataract type   . Screening for breast cancer   . Atherosclerosis of native coronary artery of native heart without angina pectoris      -will refer for screening mammogram -will checkFasting lipids -will repeat cxr July 6 (call to remind the week before) (pt going to [facility])  -Will Check on availability of free eye services to see about getting cataract removed -Pt to bring records from cardiology with her to follow up appointment -pt to complete prednisone -pt to continue daily Aspirin -pt to follow up 1 month.  She is to contact office sooner prn

## 2019-05-17 ENCOUNTER — Other Ambulatory Visit: Payer: Self-pay | Admitting: Physician Assistant

## 2019-05-17 DIAGNOSIS — Z1239 Encounter for other screening for malignant neoplasm of breast: Secondary | ICD-10-CM

## 2019-05-28 ENCOUNTER — Ambulatory Visit (HOSPITAL_COMMUNITY): Payer: Self-pay

## 2019-05-28 ENCOUNTER — Ambulatory Visit (HOSPITAL_COMMUNITY)
Admission: RE | Admit: 2019-05-28 | Discharge: 2019-05-28 | Disposition: A | Discharge status: Home / Self Care | Payer: Self-pay | Source: Ambulatory Visit | Attending: Physician Assistant | Admitting: Physician Assistant

## 2019-05-28 ENCOUNTER — Other Ambulatory Visit: Payer: Self-pay

## 2019-05-28 DIAGNOSIS — Z1239 Encounter for other screening for malignant neoplasm of breast: Secondary | ICD-10-CM | POA: Insufficient documentation

## 2019-05-29 ENCOUNTER — Other Ambulatory Visit (HOSPITAL_COMMUNITY): Payer: Self-pay | Admitting: Physician Assistant

## 2019-05-29 DIAGNOSIS — R928 Other abnormal and inconclusive findings on diagnostic imaging of breast: Secondary | ICD-10-CM

## 2019-06-05 ENCOUNTER — Other Ambulatory Visit: Payer: Self-pay

## 2019-06-05 ENCOUNTER — Ambulatory Visit (HOSPITAL_COMMUNITY)
Admission: RE | Admit: 2019-06-05 | Discharge: 2019-06-05 | Disposition: A | Discharge status: Home / Self Care | Payer: Self-pay | Source: Ambulatory Visit | Attending: Physician Assistant | Admitting: Physician Assistant

## 2019-06-05 ENCOUNTER — Other Ambulatory Visit (HOSPITAL_COMMUNITY)
Admission: RE | Admit: 2019-06-05 | Discharge: 2019-06-05 | Disposition: A | Discharge status: Home / Self Care | Payer: Self-pay | Source: Ambulatory Visit | Attending: Physician Assistant | Admitting: Physician Assistant

## 2019-06-05 ENCOUNTER — Encounter (HOSPITAL_COMMUNITY): Payer: Self-pay

## 2019-06-05 DIAGNOSIS — R928 Other abnormal and inconclusive findings on diagnostic imaging of breast: Secondary | ICD-10-CM

## 2019-06-05 DIAGNOSIS — E785 Hyperlipidemia, unspecified: Secondary | ICD-10-CM | POA: Insufficient documentation

## 2019-06-05 DIAGNOSIS — F17201 Nicotine dependence, unspecified, in remission: Secondary | ICD-10-CM | POA: Insufficient documentation

## 2019-06-05 DIAGNOSIS — R9389 Abnormal findings on diagnostic imaging of other specified body structures: Secondary | ICD-10-CM | POA: Insufficient documentation

## 2019-06-05 DIAGNOSIS — Z131 Encounter for screening for diabetes mellitus: Secondary | ICD-10-CM | POA: Insufficient documentation

## 2019-06-05 LAB — LIPID PANEL
Cholesterol: 300 mg/dL — ABNORMAL HIGH (ref 0–200)
HDL: 67 mg/dL (ref >40)
LDL Cholesterol: 202 mg/dL — ABNORMAL HIGH (ref 0–99)
Total CHOL/HDL Ratio: 4.5 RATIO
Triglycerides: 157 mg/dL — ABNORMAL HIGH (ref <150)
VLDL: 31 mg/dL (ref 0–40)

## 2019-06-05 LAB — COMPREHENSIVE METABOLIC PANEL
ALT: 21 U/L (ref 0–44)
AST: 18 U/L (ref 15–41)
Albumin: 3.6 g/dL (ref 3.5–5.0)
Alkaline Phosphatase: 79 U/L (ref 38–126)
Anion gap: 9 (ref 5–15)
BUN: 10 mg/dL (ref 8–23)
CO2: 26 mmol/L (ref 22–32)
Calcium: 9.1 mg/dL (ref 8.9–10.3)
Chloride: 107 mmol/L (ref 98–111)
Creatinine, Ser: 0.83 mg/dL (ref 0.44–1.00)
GFR calc Af Amer: >60 mL/min (ref >60)
GFR calc non Af Amer: >60 mL/min (ref >60)
Glucose, Bld: 103 mg/dL — ABNORMAL HIGH (ref 70–99)
Potassium: 4.7 mmol/L (ref 3.5–5.1)
Sodium: 142 mmol/L (ref 135–145)
Total Bilirubin: 0.6 mg/dL (ref 0.3–1.2)
Total Protein: 6.9 g/dL (ref 6.5–8.1)

## 2019-06-07 ENCOUNTER — Ambulatory Visit: Payer: Self-pay | Admitting: Physician Assistant

## 2019-06-07 LAB — HEMOGLOBIN A1C
Hgb A1c MFr Bld: 5.6 % (ref 4.8–5.6)
Mean Plasma Glucose: 114 mg/dL

## 2019-06-13 ENCOUNTER — Other Ambulatory Visit: Payer: Self-pay

## 2019-06-13 ENCOUNTER — Ambulatory Visit: Payer: Self-pay | Admitting: Physician Assistant

## 2019-06-13 ENCOUNTER — Encounter: Payer: Self-pay | Admitting: Physician Assistant

## 2019-06-13 VITALS — BP 158/70 | HR 67 | Temp 98.6°F | Wt 170.4 lb

## 2019-06-13 DIAGNOSIS — I10 Essential (primary) hypertension: Secondary | ICD-10-CM

## 2019-06-13 DIAGNOSIS — Z85828 Personal history of other malignant neoplasm of skin: Secondary | ICD-10-CM

## 2019-06-13 DIAGNOSIS — R928 Other abnormal and inconclusive findings on diagnostic imaging of breast: Secondary | ICD-10-CM

## 2019-06-13 DIAGNOSIS — Z1211 Encounter for screening for malignant neoplasm of colon: Secondary | ICD-10-CM

## 2019-06-13 DIAGNOSIS — E785 Hyperlipidemia, unspecified: Secondary | ICD-10-CM

## 2019-06-13 DIAGNOSIS — I2510 Atherosclerotic heart disease of native coronary artery without angina pectoris: Secondary | ICD-10-CM

## 2019-06-13 MED ORDER — ATORVASTATIN CALCIUM 20 MG PO TABS: 20.0000 mg | ORAL_TABLET | Freq: Every day | ORAL | 1 refills | Status: DC

## 2019-06-13 MED ORDER — METOPROLOL TARTRATE 50 MG PO TABS: 50.0000 mg | ORAL_TABLET | Freq: Two times a day (BID) | ORAL | 3 refills | Status: DC

## 2019-06-13 NOTE — Progress Notes (Signed)
BP (!) 158/70   Pulse 67   Temp 98.6 F (37 C)   Wt 170 lb 6.4 oz (77.3 kg)   SpO2 98%   BMI 27.92 kg/m    Subjective:    Patient ID: [name], female    DOB: [birth year], 63 y.o.   MRN: [id number]  HPI: [name] is a 63 y.o. female presenting on 06/13/2019 for Follow-up   HPI  Pt had a negative Covid 19 screening questionaire   Pt was seen by cardiology in [place] .  Pt  Had catheterization and was diagnosed with 3 vessel disease.  She says she has not seen cardiologist since that time.  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    Pt denies CP currently.  She says she is feeling well.    She recently stopped smoking at the start of June.    Relevant past medical, surgical, family and social history reviewed and updated as indicated. Interim medical history since our last visit reviewed. Allergies and medications reviewed and updated.   Current Outpatient Medications:  .  Acetaminophen (TYLENOL PO), Take 1 tablet by mouth as needed., Disp: , Rfl:  .  predniSONE (DELTASONE) 10 MG tablet, Take 4 tablets (40 mg total) by mouth 2 (two) times daily with a meal. X 7 days.  Then 30 mg (3 tabs) two times daily x 7 days.  Then 20 mg (2 tabs) two times daily x 7 days.  Then 10 mg (1 tab) two times daily x 7 days.  Then 10 mg (1 tab) daily x 7 days., Disp: 147 tablet, Rfl: 0 .  aspirin EC 325 MG EC tablet, Take 1 tablet (325 mg total) by mouth 2 (two) times daily after a meal. (Patient not taking: Reported on 06/13/2019), Disp: 30 tablet, Rfl: 0     Review of Systems  Per HPI unless specifically indicated above     Objective:    BP (!) 158/70   Pulse 67   Temp 98.6 F (37 C)   Wt 170 lb 6.4 oz (77.3 kg)   SpO2 98%   BMI 27.92 kg/m   Wt Readings from Last 3 Encounters:  06/13/19 170 lb 6.4 oz (77.3 kg)  05/01/19 175 lb (79.4 kg)  05/28/16  140 lb (63.5 kg)    Physical Exam Vitals signs reviewed.  Constitutional:      Appearance: She is well-developed.  HENT:     Head: Normocephalic and atraumatic.  Neck:     Musculoskeletal: Neck supple.  Cardiovascular:     Rate and Rhythm: Normal rate and regular rhythm.  Pulmonary:     Effort: Pulmonary effort is normal.     Breath sounds: Normal breath sounds.  Abdominal:     General: Bowel sounds are normal.     Palpations: Abdomen is soft. There is no mass.     Tenderness: There is no abdominal tenderness.  Musculoskeletal:     Right lower leg: No edema.     Left lower leg: No edema.  Lymphadenopathy:     Cervical: No cervical adenopathy.  Skin:    General: Skin is warm and dry.  Neurological:     Mental Status: She is alert and oriented to person, place, and time.  Psychiatric:        Attention and Perception: Attention normal.  Speech: Speech normal.        Behavior: Behavior is cooperative.     Results for orders placed or performed during the hospital encounter of 06/05/19  Hemoglobin A1c  Result Value Ref Range   Hgb A1c MFr Bld 5.6 4.8 - 5.6 %   Mean Plasma Glucose 114 mg/dL  Lipid panel  Result Value Ref Range   Cholesterol 300 (H) 0 - 200 mg/dL   Triglycerides 157 (H) <150 mg/dL   HDL 67 >40 mg/dL   Total CHOL/HDL Ratio 4.5 RATIO   VLDL 31 0 - 40 mg/dL   LDL Cholesterol 202 (H) 0 - 99 mg/dL  Comprehensive metabolic panel  Result Value Ref Range   Sodium 142 135 - 145 mmol/L   Potassium 4.7 3.5 - 5.1 mmol/L   Chloride 107 98 - 111 mmol/L   CO2 26 22 - 32 mmol/L   Glucose, Bld 103 (H) 70 - 99 mg/dL   BUN 10 8 - 23 mg/dL   Creatinine, Ser 0.83 0.44 - 1.00 mg/dL   Calcium 9.1 8.9 - 10.3 mg/dL   Total Protein 6.9 6.5 - 8.1 g/dL   Albumin 3.6 3.5 - 5.0 g/dL   AST 18 15 - 41 U/L   ALT 21 0 - 44 U/L   Alkaline Phosphatase 79 38 - 126 U/L   Total Bilirubin 0.6 0.3 - 1.2 mg/dL   GFR calc non Af Amer >60 >60 mL/min   GFR calc Af Amer >60 >60  mL/min   Anion gap 9 5 - 15      Assessment & Plan:    Encounter Diagnoses  Name Primary?  . Essential hypertension Yes  . Hyperlipidemia, unspecified hyperlipidemia type   . Atherosclerosis of native coronary artery of native heart without angina pectoris   . Abnormal mammogram   . Screening for colon cancer   . History of skin cancer        -reviewed labs with pt -will add Asa 81mg  daily -will add metoprolol for bp -will Add lipitor for dyslipidemia  -will check to see if any assistance available for Cataracts  -pt Needs diagnostic mammogram 6 months.    -pt is given ifobt for colon cancer screening  -she is encouraged to avoid prolonged sun exposure and wear sunscreen when outside  -pt encouraged to wear a mask when she goes out in public to reduce risk of covid 19  -pt to follow up 6 week to recheck bp.  She is to contact office sooner prn

## 2019-06-17 ENCOUNTER — Other Ambulatory Visit: Payer: Self-pay | Admitting: Physician Assistant

## 2019-06-17 DIAGNOSIS — R928 Other abnormal and inconclusive findings on diagnostic imaging of breast: Secondary | ICD-10-CM | POA: Insufficient documentation

## 2019-06-17 DIAGNOSIS — Z1211 Encounter for screening for malignant neoplasm of colon: Secondary | ICD-10-CM

## 2019-07-25 ENCOUNTER — Encounter: Payer: Self-pay | Admitting: Physician Assistant

## 2019-07-25 ENCOUNTER — Other Ambulatory Visit: Payer: Self-pay

## 2019-07-25 ENCOUNTER — Ambulatory Visit: Payer: Self-pay | Admitting: Physician Assistant

## 2019-07-25 VITALS — BP 134/72 | HR 58 | Temp 98.2°F | Wt 177.8 lb

## 2019-07-25 DIAGNOSIS — I10 Essential (primary) hypertension: Secondary | ICD-10-CM

## 2019-07-25 DIAGNOSIS — R609 Edema, unspecified: Secondary | ICD-10-CM

## 2019-07-25 DIAGNOSIS — I2510 Atherosclerotic heart disease of native coronary artery without angina pectoris: Secondary | ICD-10-CM

## 2019-07-25 DIAGNOSIS — E785 Hyperlipidemia, unspecified: Secondary | ICD-10-CM

## 2019-07-25 LAB — IFOBT (OCCULT BLOOD): IFOBT: NEGATIVE

## 2019-07-25 NOTE — Patient Instructions (Signed)
Financial Counselor- [phone]  

## 2019-07-25 NOTE — Progress Notes (Signed)
BP 134/72   Pulse (!) 58   Temp 98.2 F (36.8 C)   Wt 177 lb 12.8 oz (80.6 kg)   SpO2 95%   BMI 29.14 kg/m    Subjective:    Patient ID: [name], female    DOB: 11/17/1956, 63 y.o.   MRN: [id number]  HPI: [name] is a 63 y.o. female presenting on 07/25/2019 for Hypertension   HPI   Pt had a negative covid 23 screening questionnnaire.      She is still not smoking.  She says she is doing okay with that.   She says she has No CP.  She is active and exercises regularly-  She does situps squats, walking.  She has also been active recently helping her daughter pain several rooms.   She is wearing sunscreen when she goes out now.    She is having feet swelling around her ankles.  She is having no SOB.     Relevant past medical, surgical, family and social history reviewed and updated as indicated. Interim medical history since our last visit reviewed. Allergies and medications reviewed and updated.   Current Outpatient Medications:  .  aspirin 81 MG chewable tablet, Chew 81 mg by mouth daily., Disp: , Rfl:  .  atorvastatin (LIPITOR) 20 MG tablet, Take 1 tablet (20 mg total) by mouth daily., Disp: 90 tablet, Rfl: 1 .  metoprolol tartrate (LOPRESSOR) 50 MG tablet, Take 1 tablet (50 mg total) by mouth 2 (two) times daily., Disp: 180 tablet, Rfl: 3    Review of Systems  Per HPI unless specifically indicated above     Objective:    BP 134/72   Pulse (!) 58   Temp 98.2 F (36.8 C)   Wt 177 lb 12.8 oz (80.6 kg)   SpO2 95%   BMI 29.14 kg/m   Wt Readings from Last 3 Encounters:  07/25/19 177 lb 12.8 oz (80.6 kg)  06/13/19 170 lb 6.4 oz (77.3 kg)  05/01/19 175 lb (79.4 kg)    Physical Exam Vitals signs reviewed.  Constitutional:      General: She is not in acute distress.    Appearance: Normal appearance. She is well-developed. She is not ill-appearing.  HENT:     Head: Normocephalic and atraumatic.  Neck:     Musculoskeletal: Neck supple.   Cardiovascular:     Rate and Rhythm: Normal rate and regular rhythm.  Pulmonary:     Effort: Pulmonary effort is normal.     Breath sounds: Normal breath sounds.  Abdominal:     General: Bowel sounds are normal.     Palpations: Abdomen is soft. There is no mass.     Tenderness: There is no abdominal tenderness.  Musculoskeletal:     Right lower leg: Edema present.     Left lower leg: Edema present.  Lymphadenopathy:     Cervical: No cervical adenopathy.  Skin:    General: Skin is warm and dry.  Neurological:     Mental Status: She is alert and oriented to person, place, and time.  Psychiatric:        Attention and Perception: Attention normal.        Mood and Affect: Mood normal.        Speech: Speech normal.        Behavior: Behavior normal. Behavior is cooperative.         Assessment & Plan:     Encounter Diagnoses  Name Primary?  Marland Kitchen  Essential hypertension Yes  . Hyperlipidemia, unspecified hyperlipidemia type   . Atherosclerosis of native coronary artery of native heart without angina pectoris   . Edema, unspecified type      -Pt thinks she filled out application for [employer] while in hospital.  She is given phone number for financial counselor at [facility] to call and check on that.  She is told that if she hasn't filled one out, she will need to (to help pay for the echo).  -will check echo in light of edema and history CAD with HTN   -pt to continue current medications.   Will consider addition of low dose diuretic after checking echo and with bp still a bit on the high    -pt encouraged to avoid smoking and continue wearing sunscreen  She will follow up in 6 weeks with recheck labs before appointment

## 2019-08-24 ENCOUNTER — Other Ambulatory Visit: Payer: Self-pay

## 2019-08-24 ENCOUNTER — Ambulatory Visit (HOSPITAL_COMMUNITY)
Admission: RE | Admit: 2019-08-24 | Discharge: 2019-08-24 | Disposition: A | Discharge status: Home / Self Care | Payer: Self-pay | Source: Ambulatory Visit | Attending: Physician Assistant | Admitting: Physician Assistant

## 2019-08-24 DIAGNOSIS — R609 Edema, unspecified: Secondary | ICD-10-CM | POA: Insufficient documentation

## 2019-08-24 DIAGNOSIS — I10 Essential (primary) hypertension: Secondary | ICD-10-CM | POA: Insufficient documentation

## 2019-08-24 DIAGNOSIS — I2510 Atherosclerotic heart disease of native coronary artery without angina pectoris: Secondary | ICD-10-CM | POA: Insufficient documentation

## 2019-08-24 NOTE — Progress Notes (Signed)
*  PRELIMINARY RESULTS* Echocardiogram 2D Echocardiogram has been performed.  Samuel Germany 08/24/2019, 11:39 AM

## 2019-08-27 ENCOUNTER — Ambulatory Visit: Payer: Self-pay | Admitting: Physician Assistant

## 2019-09-05 ENCOUNTER — Other Ambulatory Visit: Payer: Self-pay

## 2019-09-05 ENCOUNTER — Ambulatory Visit: Payer: Self-pay | Admitting: Physician Assistant

## 2019-09-05 ENCOUNTER — Encounter: Payer: Self-pay | Admitting: Physician Assistant

## 2019-09-05 VITALS — BP 138/76 | HR 54 | Temp 98.1°F | Wt 178.0 lb

## 2019-09-05 DIAGNOSIS — I10 Essential (primary) hypertension: Secondary | ICD-10-CM

## 2019-09-05 DIAGNOSIS — I2510 Atherosclerotic heart disease of native coronary artery without angina pectoris: Secondary | ICD-10-CM

## 2019-09-05 DIAGNOSIS — E785 Hyperlipidemia, unspecified: Secondary | ICD-10-CM

## 2019-09-05 MED ORDER — METOPROLOL TARTRATE 50 MG PO TABS: 50.0000 mg | ORAL_TABLET | Freq: Two times a day (BID) | ORAL | 0 refills | Status: DC

## 2019-09-05 MED ORDER — ATORVASTATIN CALCIUM 20 MG PO TABS: 20.0000 mg | ORAL_TABLET | Freq: Every day | ORAL | 0 refills | Status: DC

## 2019-09-05 MED ORDER — LISINOPRIL 10 MG PO TABS: 10.0000 mg | ORAL_TABLET | Freq: Every day | ORAL | 0 refills | Status: DC

## 2019-09-05 NOTE — Progress Notes (Signed)
BP 138/76   Pulse (!) 54   Temp 98.1 F (36.7 C)   Wt 178 lb (80.7 kg)   SpO2 97%   BMI 29.17 kg/m    Subjective:    Patient ID: [name], female    DOB: 1956/04/23, 63 y.o.   MRN: [id number]  HPI: [name] is a 63 y.o. female presenting on 09/05/2019 for No chief complaint on file.   HPI   Pt had a negative covid 19 screening questionnaire    Pt turned in her [employer] application  Pt says swelling in ankles gone away.   That was a concern she expressed at previous OV.    She is feeling good.  She has put on a few pounds since stopping smoking.    Pt did not get her labs drawn    Relevant past medical, surgical, family and social history reviewed and updated as indicated. Interim medical history since our last visit reviewed. Allergies and medications reviewed and updated.   Current Outpatient Medications:  .  aspirin 81 MG chewable tablet, Chew 81 mg by mouth daily., Disp: , Rfl:  .  atorvastatin (LIPITOR) 20 MG tablet, Take 1 tablet (20 mg total) by mouth daily., Disp: 90 tablet, Rfl: 1 .  metoprolol tartrate (LOPRESSOR) 50 MG tablet, Take 1 tablet (50 mg total) by mouth 2 (two) times daily., Disp: 180 tablet, Rfl: 3    Review of Systems  Per HPI unless specifically indicated above     Objective:    BP 138/76   Pulse (!) 54   Temp 98.1 F (36.7 C)   Wt 178 lb (80.7 kg)   SpO2 97%   BMI 29.17 kg/m   Wt Readings from Last 3 Encounters:  09/05/19 178 lb (80.7 kg)  07/25/19 177 lb 12.8 oz (80.6 kg)  06/13/19 170 lb 6.4 oz (77.3 kg)    Physical Exam Vitals signs reviewed.  Constitutional:      General: She is not in acute distress.    Appearance: Normal appearance. She is well-developed. She is not ill-appearing.  HENT:     Head: Normocephalic and atraumatic.  Neck:     Musculoskeletal: Neck supple.  Cardiovascular:     Rate and Rhythm: Normal rate and regular rhythm.  Pulmonary:     Effort: Pulmonary effort is normal.   Breath sounds: Normal breath sounds.  Abdominal:     General: Bowel sounds are normal.     Palpations: Abdomen is soft. There is no mass.     Tenderness: There is no abdominal tenderness.  Musculoskeletal:     Right lower leg: No edema.     Left lower leg: No edema.  Lymphadenopathy:     Cervical: No cervical adenopathy.  Skin:    General: Skin is warm and dry.  Neurological:     Mental Status: She is alert and oriented to person, place, and time.  Psychiatric:        Attention and Perception: Attention normal.        Speech: Speech normal.        Behavior: Behavior normal. Behavior is cooperative.     Results for orders placed or performed in visit on 06/17/19  IFOBT POC (occult bld, rslt in office)  Result Value Ref Range   IFOBT Negative       Assessment & Plan:   Encounter Diagnoses  Name Primary?  . Essential hypertension Yes  . Hyperlipidemia, unspecified hyperlipidemia type   .  Atherosclerosis of native coronary artery of native heart without angina pectoris       -Add lisinopril in addition to current meds to better control the BP -Pt to get fasting labs drawn.  She will be called with results -pt due for repeat mammogram in January -encouraged healthy diet and regular exercise to help with extra pounds -pt to follow up 3 months.  She is to contact office sooner prn

## 2019-09-06 ENCOUNTER — Other Ambulatory Visit (HOSPITAL_COMMUNITY)
Admission: RE | Admit: 2019-09-06 | Discharge: 2019-09-06 | Disposition: A | Discharge status: Home / Self Care | Payer: Self-pay | Source: Ambulatory Visit | Attending: Physician Assistant | Admitting: Physician Assistant

## 2019-09-06 DIAGNOSIS — R609 Edema, unspecified: Secondary | ICD-10-CM | POA: Insufficient documentation

## 2019-09-06 DIAGNOSIS — I2510 Atherosclerotic heart disease of native coronary artery without angina pectoris: Secondary | ICD-10-CM | POA: Insufficient documentation

## 2019-09-06 DIAGNOSIS — E785 Hyperlipidemia, unspecified: Secondary | ICD-10-CM | POA: Insufficient documentation

## 2019-09-06 DIAGNOSIS — I10 Essential (primary) hypertension: Secondary | ICD-10-CM | POA: Insufficient documentation

## 2019-09-06 LAB — LIPID PANEL
Cholesterol: 191 mg/dL (ref 0–200)
HDL: 48 mg/dL (ref >40)
LDL Cholesterol: 106 mg/dL — ABNORMAL HIGH (ref 0–99)
Total CHOL/HDL Ratio: 4 RATIO
Triglycerides: 183 mg/dL — ABNORMAL HIGH (ref <150)
VLDL: 37 mg/dL (ref 0–40)

## 2019-09-06 LAB — COMPREHENSIVE METABOLIC PANEL
ALT: 16 U/L (ref 0–44)
AST: 19 U/L (ref 15–41)
Albumin: 3.9 g/dL (ref 3.5–5.0)
Alkaline Phosphatase: 88 U/L (ref 38–126)
Anion gap: 10 (ref 5–15)
BUN: 12 mg/dL (ref 8–23)
CO2: 24 mmol/L (ref 22–32)
Calcium: 9.1 mg/dL (ref 8.9–10.3)
Chloride: 109 mmol/L (ref 98–111)
Creatinine, Ser: 0.91 mg/dL (ref 0.44–1.00)
GFR calc Af Amer: >60 mL/min (ref >60)
GFR calc non Af Amer: >60 mL/min (ref >60)
Glucose, Bld: 97 mg/dL (ref 70–99)
Potassium: 4 mmol/L (ref 3.5–5.1)
Sodium: 143 mmol/L (ref 135–145)
Total Bilirubin: 0.6 mg/dL (ref 0.3–1.2)
Total Protein: 7 g/dL (ref 6.5–8.1)

## 2019-10-18 ENCOUNTER — Encounter: Payer: Self-pay | Admitting: Student

## 2019-11-23 IMAGING — DX CHEST - 2 VIEW
2 series · 2 of 2 positions shown · non-contrast
Comparison: CT 05/01/2019.  Chest x-ray 05/01/2019.

CLINICAL DATA: Chest congestion.

EXAM:
CHEST - 2 VIEW

[chest pa]
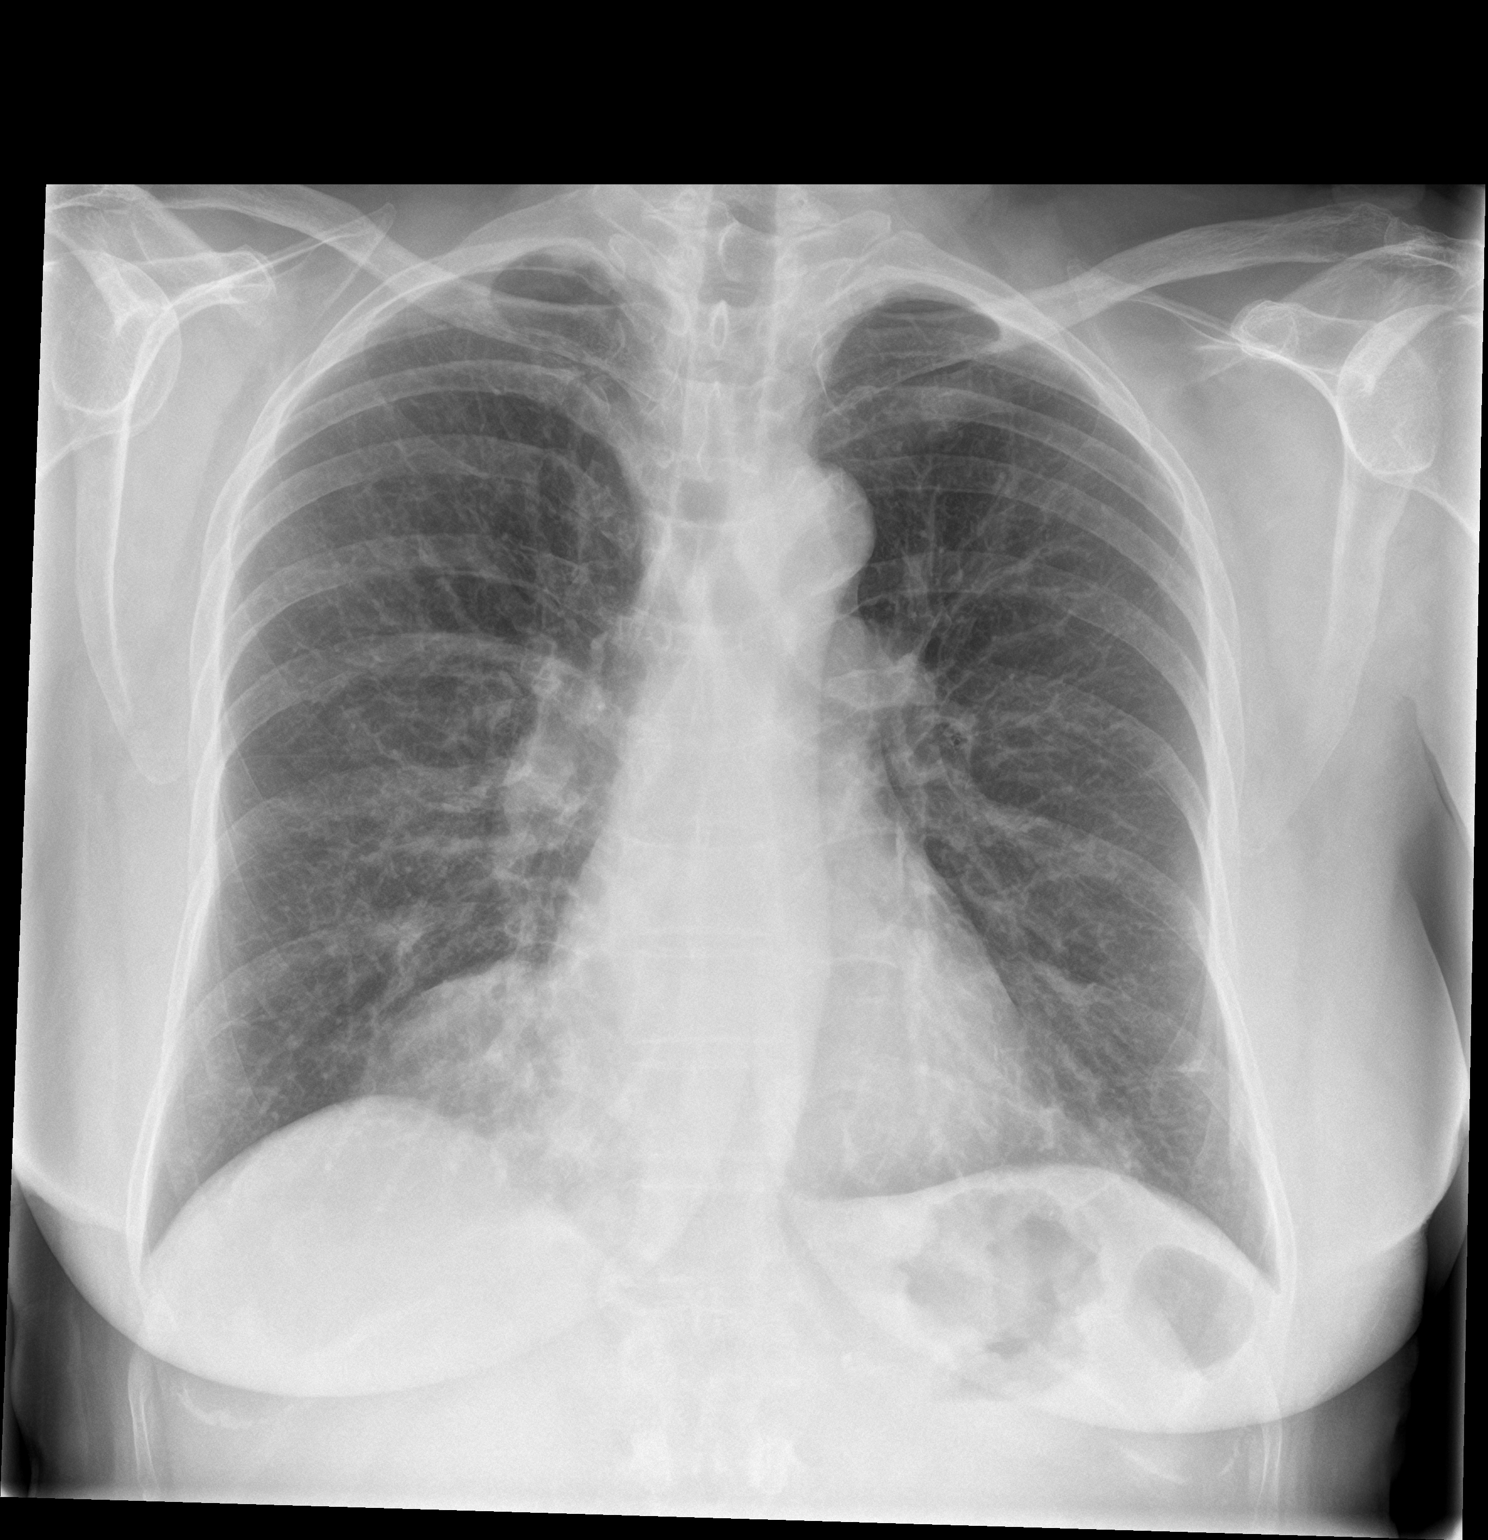

[chest lat]
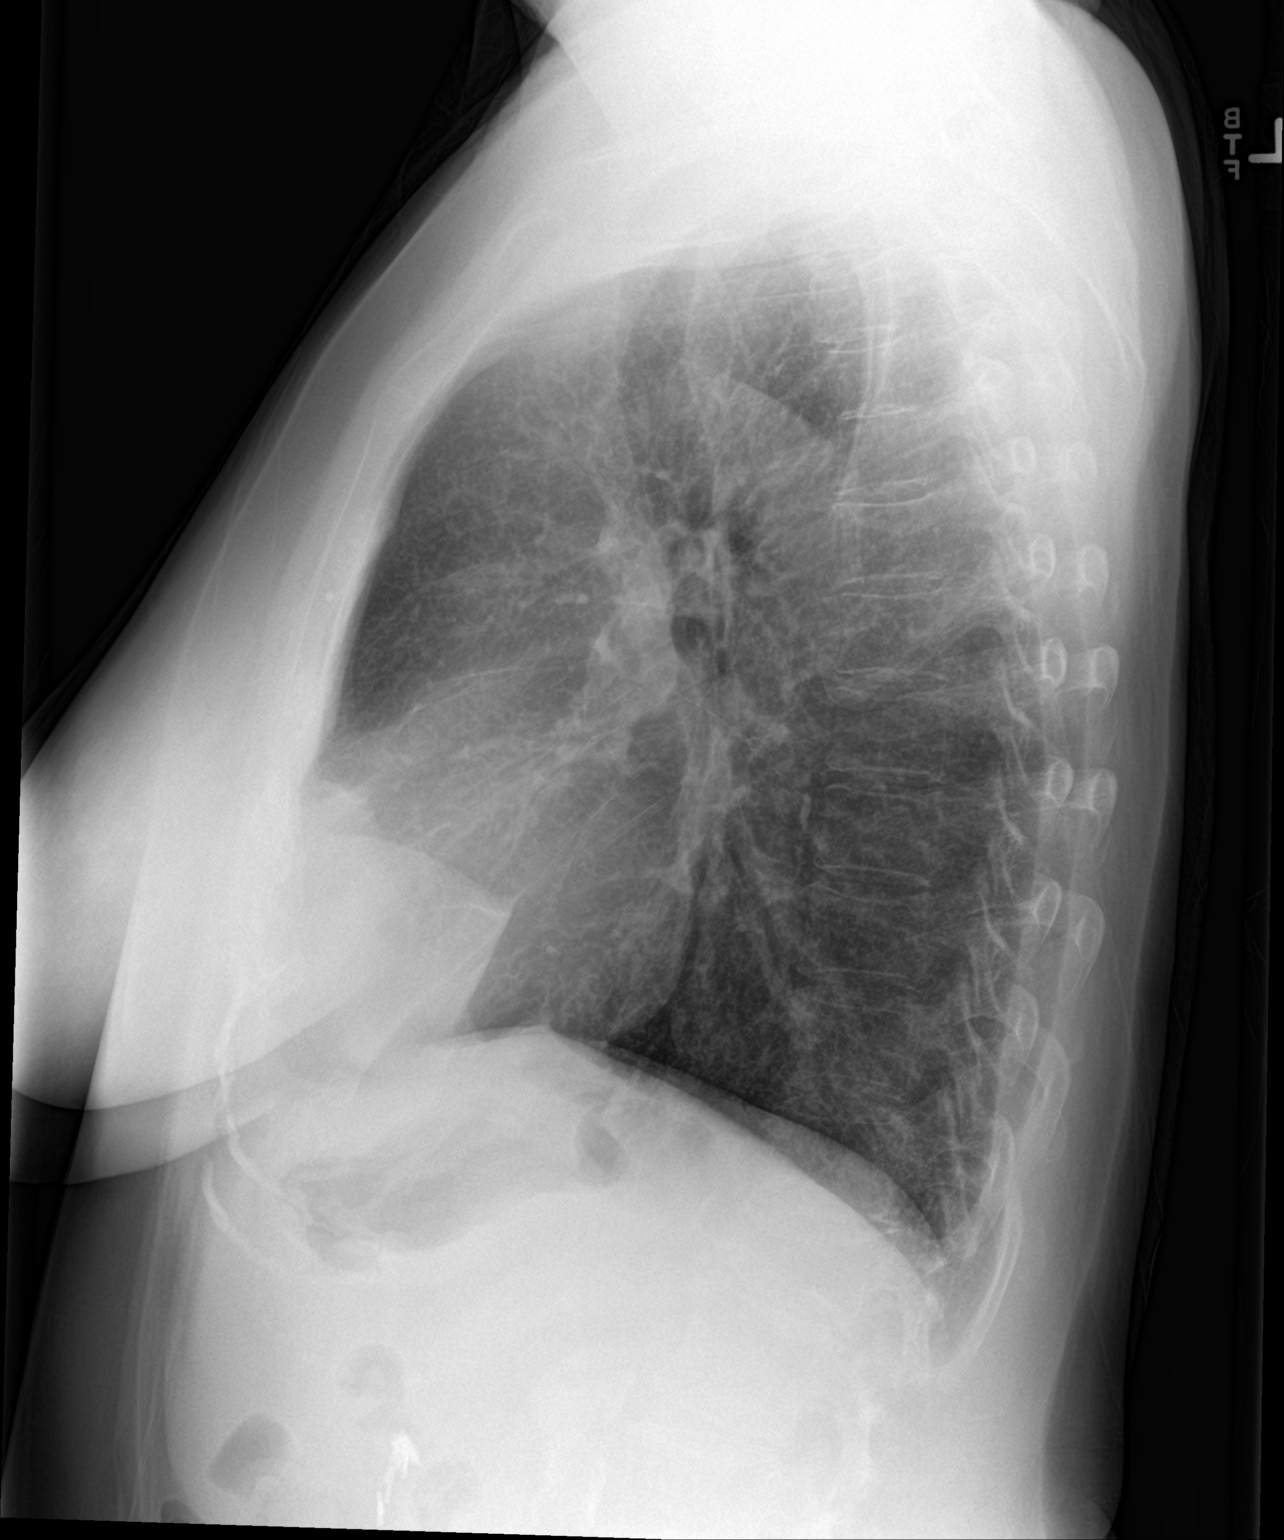

[2 of 2 positions shown; findings below may reference images not displayed]

FINDINGS: Mediastinum and hilar structures are normal. Heart size stable.
Prominent epicardial fat pad again noted. Previously identified
right perihilar and right base infiltrate has cleared. No pleural
effusion or pneumothorax.
IMPRESSION: Previously identified right perihilar and right base infiltrates
have cleared.

## 2019-11-23 IMAGING — MG DIGITAL DIAGNOSTIC UNILATERAL LEFT MAMMOGRAM WITH TOMO AND CAD
4 series · 4 of 12 positions shown · non-contrast
Comparison: Screening mammogram dated 05/28/2019.

CLINICAL DATA: Screening recall for left breast asymmetry.

EXAM:
DIGITAL DIAGNOSTIC UNILATERAL LEFT MAMMOGRAM WITH CAD AND TOMO
LEFT BREAST ULTRASOUND

[L CC synth-2D]
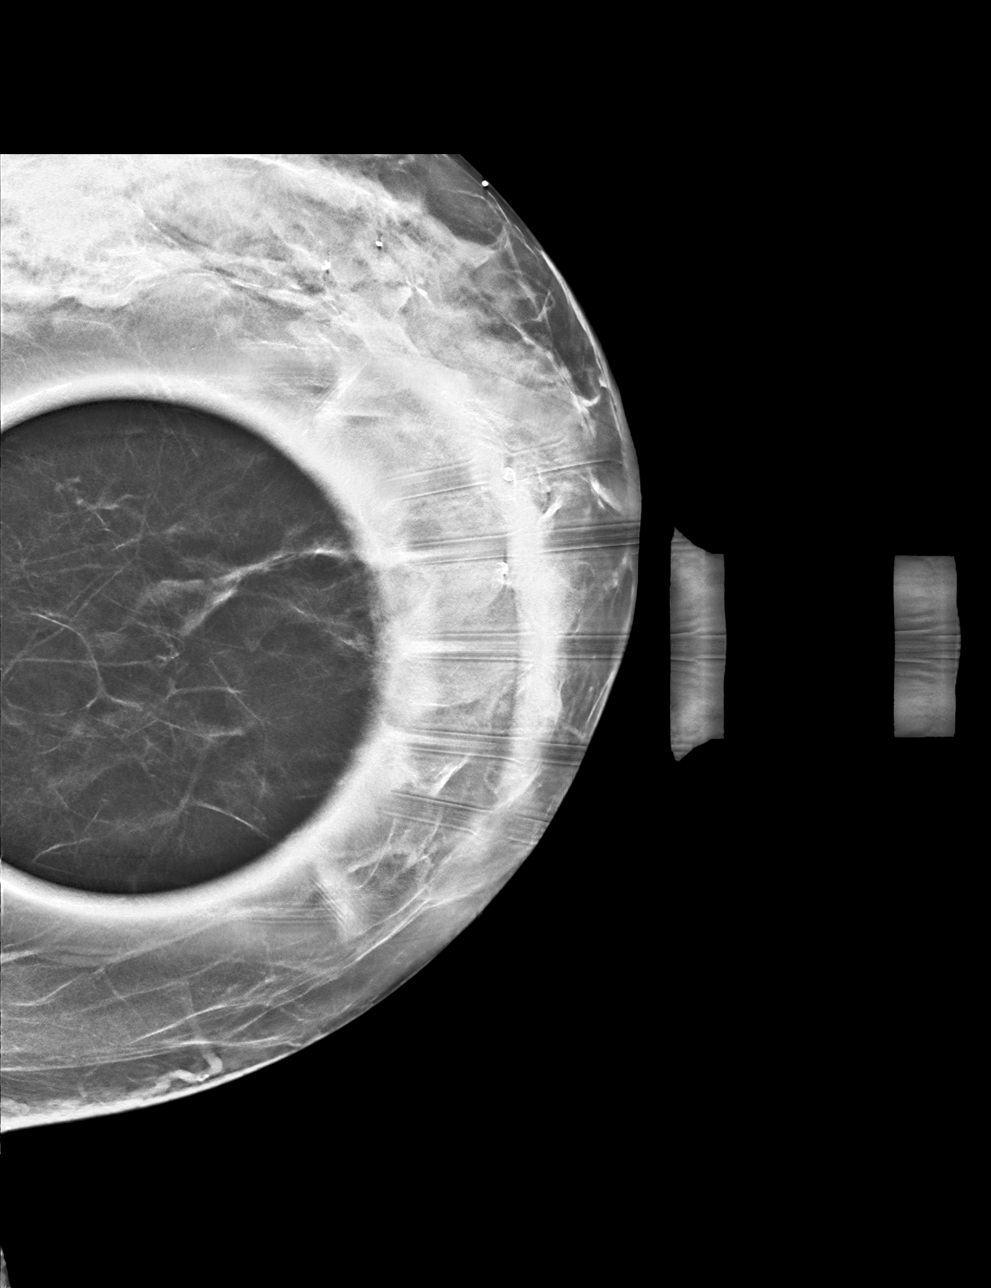

[L MLO synth-2D]
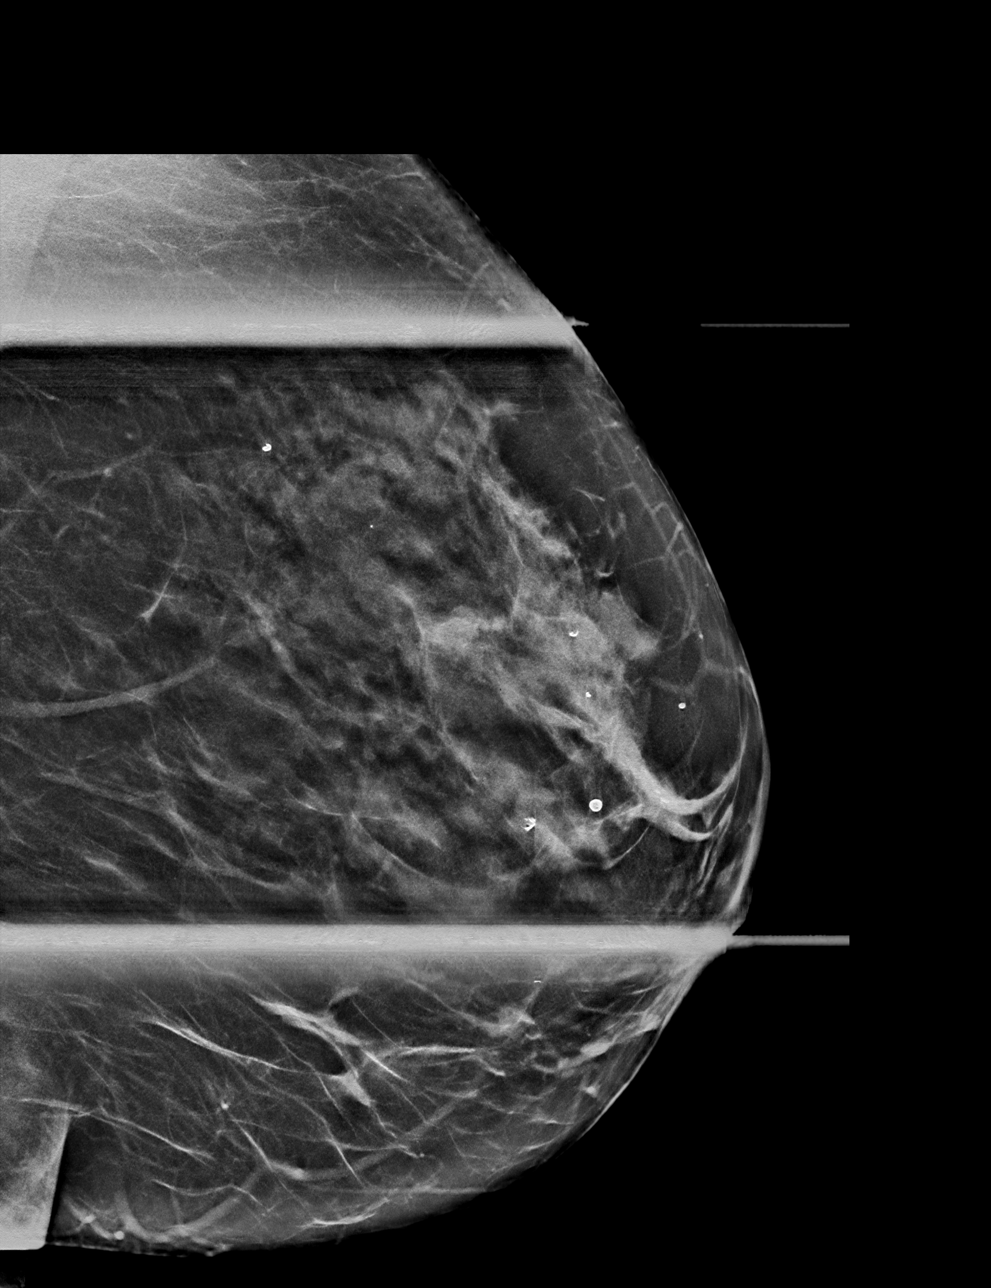

[L MLO tomo · tomo slice 29/57.0]
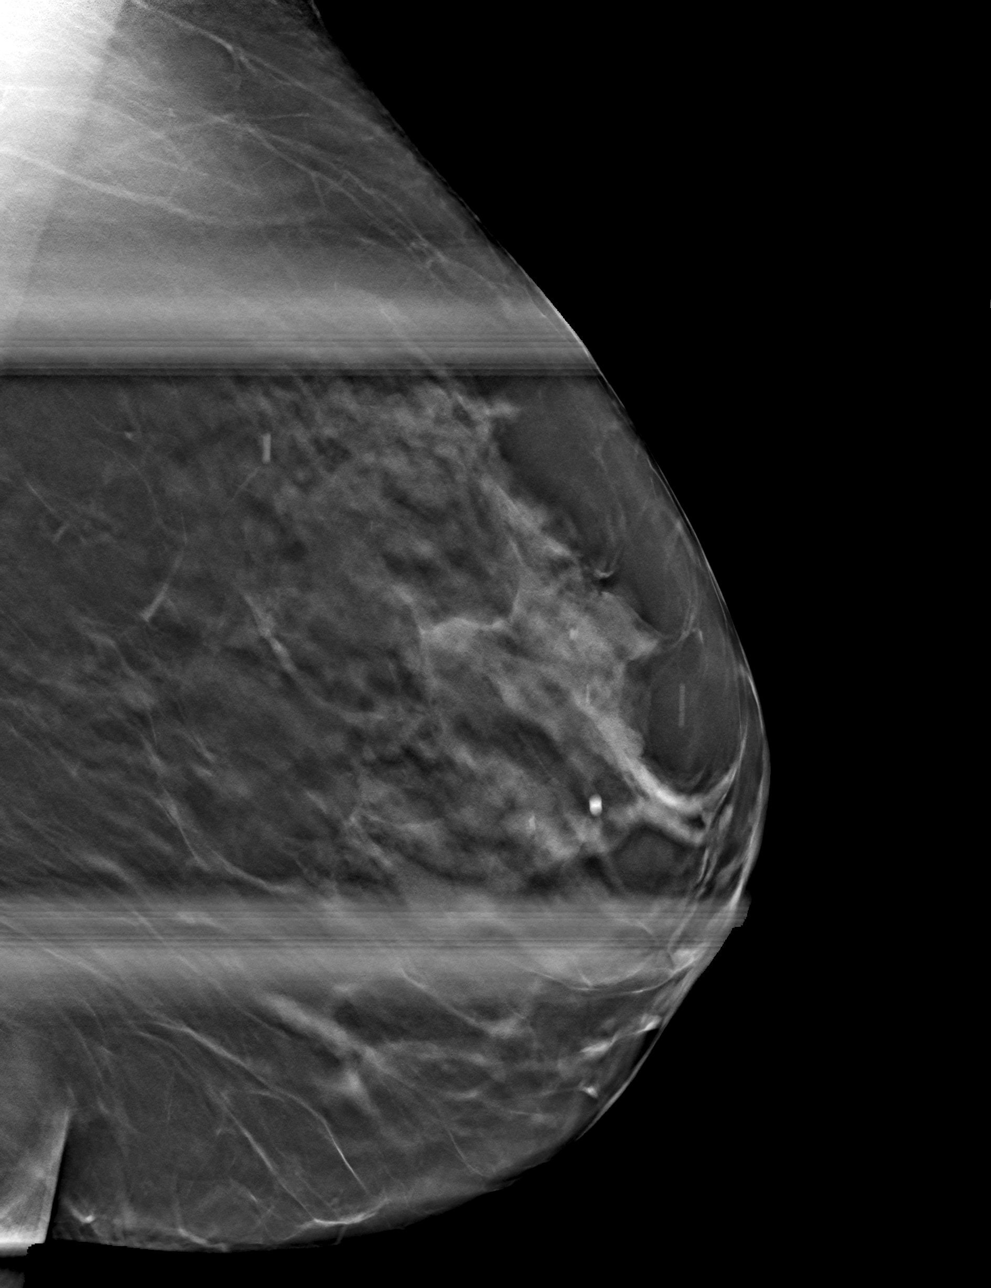

[L CC tomo · tomo slice 23/46.0]
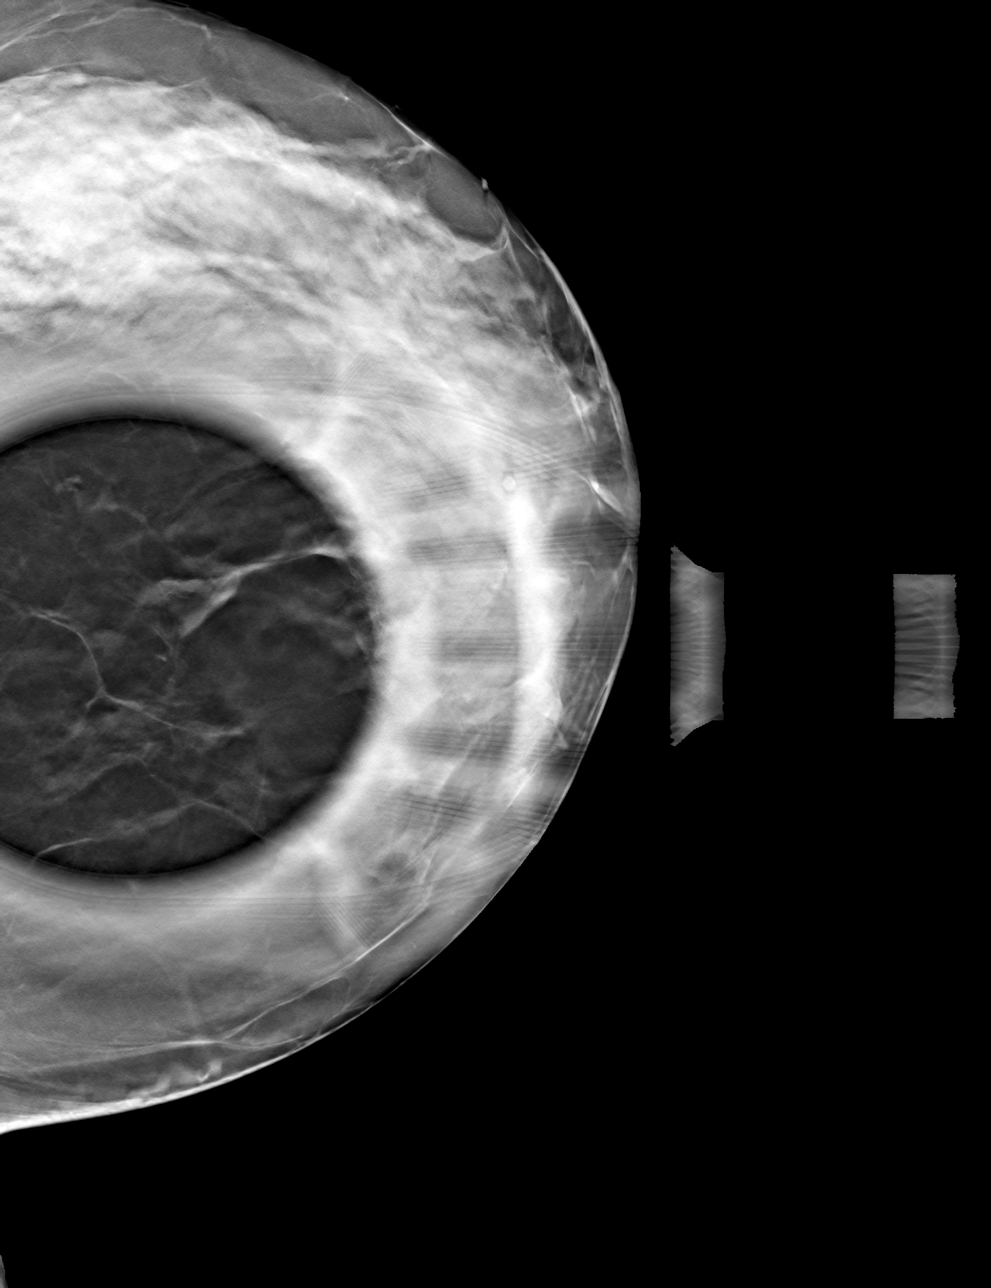

[4 of 12 positions shown; findings below may reference images not displayed]

ACR Breast Density Category c: The breast tissue is heterogeneously
dense, which may obscure small masses.
FINDINGS: Additional tomograms were performed of the left breast. The
initially questioned left breast asymmetry demonstrates imaging
features suggestive of normal fibroglandular tissue on today's
additional imaging.

Mammographic images were processed with CAD.

Physical examination of the upper inner left breast does not reveal
any palpable masses.

Targeted ultrasound of the upper inner left breast was performed. No
suspicious masses or abnormality seen, only heterogeneous
fibroglandular tissue identified with a more focal area of dense
fibroglandular tissue seen the 11 o'clock position. This may account
the asymmetry seen in the upper inner left breast at mammography.
IMPRESSION: Probably benign left breast asymmetry.

RECOMMENDATION:
Diagnostic mammography of the left breast with possible ultrasound
in 6 months.

I have discussed the findings and recommendations with the patient.
Results were also provided in writing at the conclusion of the
visit. If applicable, a reminder letter will be sent to the patient
regarding the next appointment.

BI-RADS CATEGORY  3: Probably benign.

## 2019-11-26 ENCOUNTER — Other Ambulatory Visit: Payer: Self-pay | Admitting: Physician Assistant

## 2019-11-26 DIAGNOSIS — E785 Hyperlipidemia, unspecified: Secondary | ICD-10-CM

## 2019-11-26 DIAGNOSIS — I10 Essential (primary) hypertension: Secondary | ICD-10-CM

## 2019-12-05 ENCOUNTER — Encounter: Payer: Self-pay | Admitting: Physician Assistant

## 2019-12-05 ENCOUNTER — Ambulatory Visit: Payer: Self-pay | Admitting: Physician Assistant

## 2019-12-05 ENCOUNTER — Other Ambulatory Visit: Payer: Self-pay

## 2019-12-05 VITALS — BP 180/86 | HR 77 | Temp 97.9°F

## 2019-12-05 DIAGNOSIS — E785 Hyperlipidemia, unspecified: Secondary | ICD-10-CM

## 2019-12-05 DIAGNOSIS — I10 Essential (primary) hypertension: Secondary | ICD-10-CM

## 2019-12-05 DIAGNOSIS — F439 Reaction to severe stress, unspecified: Secondary | ICD-10-CM

## 2019-12-05 MED ORDER — METOPROLOL TARTRATE 100 MG PO TABS: 100.0000 mg | ORAL_TABLET | Freq: Two times a day (BID) | ORAL | 1 refills | Status: DC

## 2019-12-05 MED ORDER — LISINOPRIL 20 MG PO TABS: 20.0000 mg | ORAL_TABLET | Freq: Every day | ORAL | 1 refills | Status: DC

## 2019-12-05 MED ORDER — ATORVASTATIN CALCIUM 20 MG PO TABS: 20.0000 mg | ORAL_TABLET | Freq: Every day | ORAL | 1 refills | Status: DC

## 2019-12-05 NOTE — Progress Notes (Signed)
BP (!) 180/86   Pulse 77   Temp 97.9 F (36.6 C)   SpO2 99%    Subjective:    Patient ID: [name], female    DOB: 1956-07-29, 64 y.o.   MRN: [id number]  HPI: [name] is a 64 y.o. female presenting on 12/05/2019 for Hypertension and Nasal Congestion (for about 2 weeks. pt denies congestion, HA, sore throat, ear ache, sneezing.)   HPI   Pt had negative covid 19 screening questionnaire.    Pt says she is stressed over hospital bills.  She says the financial counselor won't talk with her.   They returned her check telling her is was too little.    Pt says she is doing well other than her stress but says it is all she can think about- paying those bills.  She will be getting medicare starting July 1 but she says she just can't stop worrying about what she is going to do with the bills she has.   Pt was in hospital this past summer with pneumonia.    Pt has no SI, HI. No CP or SOB.    Relevant past medical, surgical, family and social history reviewed and updated as indicated. Interim medical history since our last visit reviewed. Allergies and medications reviewed and updated.   Current Outpatient Medications:  .  aspirin 81 MG chewable tablet, Chew 81 mg by mouth daily., Disp: , Rfl:  .  atorvastatin (LIPITOR) 20 MG tablet, Take 1 tablet (20 mg total) by mouth daily., Disp: 90 tablet, Rfl: 0 .  lisinopril (ZESTRIL) 10 MG tablet, Take 1 tablet (10 mg total) by mouth daily., Disp: 90 tablet, Rfl: 0 .  metoprolol tartrate (LOPRESSOR) 50 MG tablet, Take 1 tablet (50 mg total) by mouth 2 (two) times daily., Disp: 180 tablet, Rfl: 0   Review of Systems  Per HPI unless specifically indicated above     Objective:    BP (!) 180/86   Pulse 77   Temp 97.9 F (36.6 C)   SpO2 99%   Wt Readings from Last 3 Encounters:  09/05/19 178 lb (80.7 kg)  07/25/19 177 lb 12.8 oz (80.6 kg)  06/13/19 170 lb 6.4 oz (77.3 kg)    Physical Exam Vitals reviewed.  Constitutional:    Appearance: She is well-developed. She is not ill-appearing or toxic-appearing.  HENT:     Head: Normocephalic and atraumatic.  Cardiovascular:     Rate and Rhythm: Normal rate and regular rhythm.  Pulmonary:     Effort: Pulmonary effort is normal.     Breath sounds: Normal breath sounds.  Abdominal:     General: Bowel sounds are normal.     Palpations: Abdomen is soft. There is no mass.     Tenderness: There is no abdominal tenderness.  Musculoskeletal:     Cervical back: Neck supple.     Right lower leg: No edema.     Left lower leg: No edema.  Lymphadenopathy:     Cervical: No cervical adenopathy.  Skin:    General: Skin is warm and dry.  Neurological:     Mental Status: She is alert and oriented to person, place, and time.  Psychiatric:        Attention and Perception: Attention normal.        Mood and Affect: Affect is tearful.        Speech: Speech normal.        Behavior: Behavior is cooperative.  Assessment & Plan:    Encounter Diagnoses  Name Primary?  . Essential hypertension Yes  . Hyperlipidemia, unspecified hyperlipidemia type   . Stress       Discussed with pt that she needs to stop being overly focused on her bills due to the worrying that is causing her BP to go up.  She is encouraged to contact [facility] and discuss with them getting help dealing with the financial dept at the hospital.  She is given their card with contact information.   Pt didn't get labs done because she didn't want any more bills.   This is understandable and will wait on further labs at this time for that reason.  Pt to continue her current statin and will increase her BP meds.  Pt is to follow up in 1 month to recheck the blood pressure.  She is encouraged to contact office sooner if needed.

## 2019-12-06 ENCOUNTER — Ambulatory Visit: Payer: Self-pay | Admitting: Physician Assistant

## 2020-01-04 ENCOUNTER — Telehealth: Payer: Self-pay

## 2020-01-09 ENCOUNTER — Encounter (INDEPENDENT_AMBULATORY_CARE_PROVIDER_SITE_OTHER): Payer: Self-pay

## 2020-01-09 ENCOUNTER — Ambulatory Visit: Payer: Self-pay | Admitting: Physician Assistant

## 2020-01-09 ENCOUNTER — Encounter: Payer: Self-pay | Admitting: Physician Assistant

## 2020-01-09 VITALS — BP 160/80 | HR 101 | Temp 98.2°F | Wt 196.4 lb

## 2020-01-09 DIAGNOSIS — I10 Essential (primary) hypertension: Secondary | ICD-10-CM

## 2020-01-09 DIAGNOSIS — E785 Hyperlipidemia, unspecified: Secondary | ICD-10-CM

## 2020-01-09 MED ORDER — METOPROLOL TARTRATE 100 MG PO TABS: 100.0000 mg | ORAL_TABLET | Freq: Two times a day (BID) | ORAL | 1 refills | Status: DC

## 2020-01-09 MED ORDER — AMLODIPINE BESYLATE 5 MG PO TABS: 5.0000 mg | ORAL_TABLET | Freq: Every day | ORAL | 1 refills | Status: DC

## 2020-01-09 MED ORDER — LISINOPRIL 20 MG PO TABS: 20.0000 mg | ORAL_TABLET | Freq: Every day | ORAL | 1 refills | Status: DC

## 2020-01-09 MED ORDER — ATORVASTATIN CALCIUM 20 MG PO TABS: 20.0000 mg | ORAL_TABLET | Freq: Every day | ORAL | 1 refills | Status: DC

## 2020-01-09 NOTE — Progress Notes (Signed)
BP (!) 160/80   Pulse (!) 101   Temp 98.2 F (36.8 C)   Wt 196 lb 6.4 oz (89.1 kg)   SpO2 97%   BMI 32.19 kg/m    Subjective:    Patient ID: [name], female    DOB: Feb 11, 1956, 64 y.o.   MRN: [id number]  HPI: SONAM KARAFFA is a 64 y.o. female presenting on 01/09/2020 for Hypertension   HPI   This is a telemedicine appointment through [employer] due to coronavirus pandemic.  I connected with  [name] on 01/09/2020 by a video enabled telemedicine application and verified that I am speaking with the correct person using two identifiers.   I discussed the limitations of evaluation and management by telemedicine. The patient expressed understanding and agreed to proceed.  Pt is in the office.  Provider is working from home office.   Pt presents to the office today to follow up on her hypertension.  She was seen last month and her BP was quite elevated.  She was also very upset due to concerns over finances and bills.  Pt says she is doing great today.  She says she is feeling happy..   She says the hospital is working with her on a payment plan so she says it's all good now.  Pt states her breathing is great  She is a bit concerned about weight gain since stopping smoking    Relevant past medical, surgical, family and social history reviewed and updated as indicated. Interim medical history since our last visit reviewed. Allergies and medications reviewed and updated.   Current Outpatient Medications:  .  aspirin 81 MG chewable tablet, Chew 81 mg by mouth daily., Disp: , Rfl:  .  atorvastatin (LIPITOR) 20 MG tablet, Take 1 tablet (20 mg total) by mouth daily., Disp: 90 tablet, Rfl: 1 .  lisinopril (ZESTRIL) 20 MG tablet, Take 1 tablet (20 mg total) by mouth daily., Disp: 90 tablet, Rfl: 1 .  metoprolol tartrate (LOPRESSOR) 100 MG tablet, Take 1 tablet (100 mg total) by mouth 2 (two) times daily., Disp: 180 tablet, Rfl: 1    Review of Systems  Per HPI unless  specifically indicated above     Objective:    BP (!) 160/80   Pulse (!) 101   Temp 98.2 F (36.8 C)   Wt 196 lb 6.4 oz (89.1 kg)   SpO2 97%   BMI 32.19 kg/m   Wt Readings from Last 3 Encounters:  01/09/20 196 lb 6.4 oz (89.1 kg)  09/05/19 178 lb (80.7 kg)  07/25/19 177 lb 12.8 oz (80.6 kg)    Physical Exam Nursing note reviewed.  Constitutional:      General: She is not in acute distress.    Appearance: Normal appearance. She is not ill-appearing.  HENT:     Head: Normocephalic and atraumatic.  Pulmonary:     Effort: Pulmonary effort is normal. No respiratory distress.  Neurological:     Mental Status: She is alert and oriented to person, place, and time.  Psychiatric:        Attention and Perception: Attention normal.        Mood and Affect: Mood normal.        Speech: Speech normal.        Behavior: Behavior normal. Behavior is cooperative.           Assessment & Plan:    Encounter Diagnoses  Name Primary?  . Essential hypertension Yes  .  Hyperlipidemia, unspecified hyperlipidemia type      -will Add amlodipine.  Pt to Continue lisinopril and metoprolol and atorvastatin -Counseled on low sodium diet and will give reading information -Discussed and agreed no labs  Until she gets medicare due to $$ issues. Her labs have been stable -encouraged healthy diet and regular exercise to help weight -Pt to follow up 1 month to recheck bp.  She is to contact office sooner prn

## 2020-01-09 NOTE — Patient Instructions (Signed)
DASH Eating Plan DASH stands for "Dietary Approaches to Stop Hypertension." The DASH eating plan is a healthy eating plan that has been shown to reduce high blood pressure (hypertension). It may also reduce your risk for type 2 diabetes, heart disease, and stroke. The DASH eating plan may also help with weight loss. What are tips for following this plan?  General guidelines  Avoid eating more than 2,300 mg (milligrams) of salt (sodium) a day. If you have hypertension, you may need to reduce your sodium intake to 1,500 mg a day.  Limit alcohol intake to no more than 1 drink a day for nonpregnant women and 2 drinks a day for men. One drink equals 12 oz of beer, 5 oz of wine, or 1 oz of hard liquor.  Work with your health care provider to maintain a healthy body weight or to lose weight. Ask what an ideal weight is for you.  Get at least 30 minutes of exercise that causes your heart to beat faster (aerobic exercise) most days of the week. Activities may include walking, swimming, or biking.  Work with your health care provider or diet and nutrition specialist (dietitian) to adjust your eating plan to your individual calorie needs. Reading food labels   Check food labels for the amount of sodium per serving. Choose foods with less than 5 percent of the Daily Value of sodium. Generally, foods with less than 300 mg of sodium per serving fit into this eating plan.  To find whole grains, look for the word "whole" as the first word in the ingredient list. Shopping  Buy products labeled as "low-sodium" or "no salt added."  Buy fresh foods. Avoid canned foods and premade or frozen meals. Cooking  Avoid adding salt when cooking. Use salt-free seasonings or herbs instead of table salt or sea salt. Check with your health care provider or pharmacist before using salt substitutes.  Do not fry foods. Cook foods using healthy methods such as baking, boiling, grilling, and broiling instead.  Cook with  heart-healthy oils, such as olive, canola, soybean, or sunflower oil. Meal planning  Eat a balanced diet that includes: ? 5 or more servings of fruits and vegetables each day. At each meal, try to fill half of your plate with fruits and vegetables. ? Up to 6-8 servings of whole grains each day. ? Less than 6 oz of lean meat, poultry, or fish each day. A 3-oz serving of meat is about the same size as a deck of cards. One egg equals 1 oz. ? 2 servings of low-fat dairy each day. ? A serving of nuts, seeds, or beans 5 times each week. ? Heart-healthy fats. Healthy fats called Omega-3 fatty acids are found in foods such as flaxseeds and coldwater fish, like sardines, salmon, and mackerel.  Limit how much you eat of the following: ? Canned or prepackaged foods. ? Food that is high in trans fat, such as fried foods. ? Food that is high in saturated fat, such as fatty meat. ? Sweets, desserts, sugary drinks, and other foods with added sugar. ? Full-fat dairy products.  Do not salt foods before eating.  Try to eat at least 2 vegetarian meals each week.  Eat more home-cooked food and less restaurant, buffet, and fast food.  When eating at a restaurant, ask that your food be prepared with less salt or no salt, if possible. What foods are recommended? The items listed may not be a complete list. Talk with your dietitian about   what dietary choices are best for you. Grains Whole-grain or whole-wheat bread. Whole-grain or whole-wheat pasta. Brown rice. Oatmeal. Quinoa. Bulgur. Whole-grain and low-sodium cereals. Pita bread. Low-fat, low-sodium crackers. Whole-wheat flour tortillas. Vegetables Fresh or frozen vegetables (raw, steamed, roasted, or grilled). Low-sodium or reduced-sodium tomato and vegetable juice. Low-sodium or reduced-sodium tomato sauce and tomato paste. Low-sodium or reduced-sodium canned vegetables. Fruits All fresh, dried, or frozen fruit. Canned fruit in natural juice (without  added sugar). Meat and other protein foods Skinless chicken or turkey. Ground chicken or turkey. Pork with fat trimmed off. Fish and seafood. Egg whites. Dried beans, peas, or lentils. Unsalted nuts, nut butters, and seeds. Unsalted canned beans. Lean cuts of beef with fat trimmed off. Low-sodium, lean deli meat. Dairy Low-fat (1%) or fat-free (skim) milk. Fat-free, low-fat, or reduced-fat cheeses. Nonfat, low-sodium ricotta or cottage cheese. Low-fat or nonfat yogurt. Low-fat, low-sodium cheese. Fats and oils Soft margarine without trans fats. Vegetable oil. Low-fat, reduced-fat, or light mayonnaise and salad dressings (reduced-sodium). Canola, safflower, olive, soybean, and sunflower oils. Avocado. Seasoning and other foods Herbs. Spices. Seasoning mixes without salt. Unsalted popcorn and pretzels. Fat-free sweets. What foods are not recommended? The items listed may not be a complete list. Talk with your dietitian about what dietary choices are best for you. Grains Baked goods made with fat, such as croissants, muffins, or some breads. Dry pasta or rice meal packs. Vegetables Creamed or fried vegetables. Vegetables in a cheese sauce. Regular canned vegetables (not low-sodium or reduced-sodium). Regular canned tomato sauce and paste (not low-sodium or reduced-sodium). Regular tomato and vegetable juice (not low-sodium or reduced-sodium). Pickles. Olives. Fruits Canned fruit in a light or heavy syrup. Fried fruit. Fruit in cream or butter sauce. Meat and other protein foods Fatty cuts of meat. Ribs. Fried meat. Bacon. Sausage. Bologna and other processed lunch meats. Salami. Fatback. Hotdogs. Bratwurst. Salted nuts and seeds. Canned beans with added salt. Canned or smoked fish. Whole eggs or egg yolks. Chicken or turkey with skin. Dairy Whole or 2% milk, cream, and half-and-half. Whole or full-fat cream cheese. Whole-fat or sweetened yogurt. Full-fat cheese. Nondairy creamers. Whipped toppings.  Processed cheese and cheese spreads. Fats and oils Butter. Stick margarine. Lard. Shortening. Ghee. Bacon fat. Tropical oils, such as coconut, palm kernel, or palm oil. Seasoning and other foods Salted popcorn and pretzels. Onion salt, garlic salt, seasoned salt, table salt, and sea salt. Worcestershire sauce. Tartar sauce. Barbecue sauce. Teriyaki sauce. Soy sauce, including reduced-sodium. Steak sauce. Canned and packaged gravies. Fish sauce. Oyster sauce. Cocktail sauce. Horseradish that you find on the shelf. Ketchup. Mustard. Meat flavorings and tenderizers. Bouillon cubes. Hot sauce and Tabasco sauce. Premade or packaged marinades. Premade or packaged taco seasonings. Relishes. Regular salad dressings. Where to find more information:  National Heart, Lung, and Blood Institute: www.nhlbi.nih.gov  American Heart Association: www.heart.org Summary  The DASH eating plan is a healthy eating plan that has been shown to reduce high blood pressure (hypertension). It may also reduce your risk for type 2 diabetes, heart disease, and stroke.  With the DASH eating plan, you should limit salt (sodium) intake to 2,300 mg a day. If you have hypertension, you may need to reduce your sodium intake to 1,500 mg a day.  When on the DASH eating plan, aim to eat more fresh fruits and vegetables, whole grains, lean proteins, low-fat dairy, and heart-healthy fats.  Work with your health care provider or diet and nutrition specialist (dietitian) to adjust your eating plan to your   individual calorie needs. This information is not intended to replace advice given to you by your health care provider. Make sure you discuss any questions you have with your health care provider. Document Revised: 10/28/2017 Document Reviewed: 11/08/2016 Elsevier Patient Education  2020 Elsevier Inc.  

## 2020-02-07 ENCOUNTER — Other Ambulatory Visit: Payer: Self-pay

## 2020-02-07 ENCOUNTER — Ambulatory Visit: Payer: Self-pay | Admitting: Physician Assistant

## 2020-02-07 ENCOUNTER — Encounter: Payer: Self-pay | Admitting: Physician Assistant

## 2020-02-07 VITALS — BP 142/70 | HR 49 | Temp 98.1°F | Wt 196.8 lb

## 2020-02-07 DIAGNOSIS — I10 Essential (primary) hypertension: Secondary | ICD-10-CM

## 2020-02-07 DIAGNOSIS — E785 Hyperlipidemia, unspecified: Secondary | ICD-10-CM

## 2020-02-07 NOTE — Progress Notes (Signed)
BP (!) 142/70   Pulse (!) 49   Temp 98.1 F (36.7 C)   Wt 196 lb 12.8 oz (89.3 kg)   SpO2 96%   BMI 32.25 kg/m    Subjective:    Patient ID: [name], female    DOB: September 05, 1956, 64 y.o.   MRN: [id number]  HPI: [name] is a 64 y.o. female presenting on 02/07/2020 for Hypertension   HPI    Pt had a negative covid 24 screening questionnaire    Pt is a very pleasant 28yoF with HTN and dyslipidemia.   She is getting medicare starting 05/29/20.  She has not been doing labs recently due to billing as discussed at previous appointments.  She is doing well and has no complaints today.    Relevant past medical, surgical, family and social history reviewed and updated as indicated. Interim medical history since our last visit reviewed. Allergies and medications reviewed and updated.   Current Outpatient Medications:  .  amLODipine (NORVASC) 5 MG tablet, Take 1 tablet (5 mg total) by mouth daily., Disp: 90 tablet, Rfl: 1 .  aspirin 81 MG chewable tablet, Chew 81 mg by mouth daily., Disp: , Rfl:  .  atorvastatin (LIPITOR) 20 MG tablet, Take 1 tablet (20 mg total) by mouth daily., Disp: 90 tablet, Rfl: 1 .  lisinopril (ZESTRIL) 20 MG tablet, Take 1 tablet (20 mg total) by mouth daily., Disp: 90 tablet, Rfl: 1 .  metoprolol tartrate (LOPRESSOR) 100 MG tablet, Take 1 tablet (100 mg total) by mouth 2 (two) times daily., Disp: 180 tablet, Rfl: 1   Review of Systems  Per HPI unless specifically indicated above     Objective:    BP (!) 142/70   Pulse (!) 49   Temp 98.1 F (36.7 C)   Wt 196 lb 12.8 oz (89.3 kg)   SpO2 96%   BMI 32.25 kg/m   Wt Readings from Last 3 Encounters:  02/07/20 196 lb 12.8 oz (89.3 kg)  01/09/20 196 lb 6.4 oz (89.1 kg)  09/05/19 178 lb (80.7 kg)    Physical Exam Vitals reviewed.  Constitutional:      General: She is not in acute distress.    Appearance: Normal appearance. She is well-developed. She is not ill-appearing.  HENT:     Head:  Normocephalic and atraumatic.  Cardiovascular:     Rate and Rhythm: Normal rate and regular rhythm.  Pulmonary:     Effort: Pulmonary effort is normal.     Breath sounds: Normal breath sounds.  Abdominal:     General: Bowel sounds are normal.     Palpations: Abdomen is soft. There is no mass.     Tenderness: There is no abdominal tenderness.  Musculoskeletal:     Cervical back: Neck supple.     Right lower leg: No edema.     Left lower leg: No edema.  Lymphadenopathy:     Cervical: No cervical adenopathy.  Skin:    General: Skin is warm and dry.  Neurological:     Mental Status: She is alert and oriented to person, place, and time.  Psychiatric:        Mood and Affect: Mood normal.        Speech: Speech normal.        Behavior: Behavior normal. Behavior is cooperative.             Assessment & Plan:   Encounter Diagnoses  Name Primary?  . Essential hypertension  Yes  . Hyperlipidemia, unspecified hyperlipidemia type       -pt to Get scheduled for appt with new PCP after July 1 -discussed that her medication still needs adjustment but her bp has improved dramatically over the past several months.  Will keep with same meds at this time.  Discussed with pt that her labs will need to be updated when she gets on medicare with new PCP. -her mammogram will be due for updating in July -her last colon cancer screening test/iFOBT was done in august 2020 -no follow up scheduled.  Pt to contact office if needed prior to 05/29/20

## 2021-04-07 DIAGNOSIS — E539 Vitamin B deficiency, unspecified: Secondary | ICD-10-CM | POA: Insufficient documentation

## 2021-04-07 DIAGNOSIS — E559 Vitamin D deficiency, unspecified: Secondary | ICD-10-CM | POA: Insufficient documentation

## 2022-01-11 DIAGNOSIS — R030 Elevated blood-pressure reading, without diagnosis of hypertension: Secondary | ICD-10-CM | POA: Insufficient documentation

## 2022-05-10 DIAGNOSIS — Z79899 Other long term (current) drug therapy: Secondary | ICD-10-CM | POA: Insufficient documentation

## 2022-06-10 ENCOUNTER — Encounter (HOSPITAL_COMMUNITY)
Admission: RE | Admit: 2022-06-10 | Discharge: 2022-06-10 | Disposition: A | Discharge status: Home / Self Care | Payer: Medicare Other | Source: Ambulatory Visit | Attending: Ophthalmology | Admitting: Ophthalmology

## 2022-06-10 ENCOUNTER — Encounter (HOSPITAL_COMMUNITY): Payer: Self-pay

## 2022-06-10 HISTORY — DX: Essential (primary) hypertension: I10

## 2022-06-15 NOTE — H&P (Signed)
Surgical History & Physical  Patient Name: [name] DOB: September 01, 1956  Surgery: Cataract extraction with intraocular lens implant phacoemulsification; Right Eye  Surgeon: Baruch Goldmann MD Surgery Date:  06-18-22 Pre-Op Date:  06-14-22  HPI: A 67 Yr. old female patient is referred from [facility] ( Dr Mason Jim) for cataract eval. Pt states she cant see at all out of her right eye. 1. The patient complains of difficulty when recognizing people, which began many years ago. The right eye is affected. The episode is gradual. The condition's severity increased since last visit. Symptoms occur when the patient is driving, inside, outside and reading. HPI was performed by Baruch Goldmann .  Medical History: Cataracts Diabetes High Blood Pressure LDL Lung Problems Skin cancer(Squamous cell), Hip fracture, Hypokale...  Review of Systems Negative Allergic/Immunologic Negative Cardiovascular Negative Constitutional Negative Ear, Nose, Mouth & Throat Negative Endocrine Negative Eyes Negative Gastrointestinal Negative Genitourinary Negative Hemotologic/Lymphatic Negative Integumentary Negative Musculoskeletal Negative Neurological Negative Psychiatry Negative Respiratory  Social   Current every day smoker / less than 1pack.day Per Day  Medication  Synthroid, Aspirin, Advil, Atorvastatin, Fish Oil, Vitamin D3, Vitamin B-12,   Sx/Procedures Hip Replacement L, Tonsilectomy/Adeniectomy,   Drug Allergies  Cortisone shot,   History & Physical: Heent: Cataract, right eye NECK: supple without bruits LUNGS: lungs clear to auscultation CV: regular rate and rhythm Abdomen: soft and non-tender  Impression & Plan: Assessment: 1.  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  DERMATOCHALASIS, no surgery; Right Upper Lid, Left Upper Lid (H02.831, H02.834) 3.  BLEPHARITIS; Right Upper Lid, Right Lower Lid, Left Upper Lid, Left Lower Lid (H01.001,  H01.002,H01.004,H01.005) 4.  COMBINED FORMS AGE RELATED CATARACT; Left Eye (H25.812)  Plan: 1.  Cataract accounts for the patient's decreased vision. This visual impairment is not correctable with a tolerable change in glasses or contact lenses. Cataract surgery with an implantation of a new lens should significantly improve the visual and functional status of the patient. Discussed all risks, benefits, alternatives, and potential complications. Discussed the procedures and recovery. Patient desires to have surgery. A-scan ordered and performed today for intra-ocular lens calculations. The surgery will be performed in order to improve vision for driving, reading, and for eye examinations. Recommend phacoemulsification with intra-ocular lens. Recommend Dextenza for post-operative pain and inflammation. Right Eye first. Dilates poorly - shugacaine by protocol. [facility]. Malyugin Ring. Omidira.  2.  Asymptomatic, recommend observation for now. Findings, prognosis and treatment options reviewed.  3.  Recommend regular lid cleaning.  4.  Will address after right eye.

## 2022-06-18 ENCOUNTER — Encounter (HOSPITAL_COMMUNITY): Payer: Self-pay | Admitting: Ophthalmology

## 2022-06-18 ENCOUNTER — Ambulatory Visit (HOSPITAL_COMMUNITY)
Admission: RE | Admit: 2022-06-18 | Discharge: 2022-06-18 | Disposition: A | Discharge status: Home / Self Care | Payer: Medicare Other | Attending: Ophthalmology | Admitting: Ophthalmology

## 2022-06-18 ENCOUNTER — Other Ambulatory Visit: Payer: Self-pay

## 2022-06-18 ENCOUNTER — Ambulatory Visit (HOSPITAL_BASED_OUTPATIENT_CLINIC_OR_DEPARTMENT_OTHER): Payer: Medicare Other | Admitting: Anesthesiology

## 2022-06-18 ENCOUNTER — Ambulatory Visit (HOSPITAL_COMMUNITY): Payer: Medicare Other | Admitting: Anesthesiology

## 2022-06-18 ENCOUNTER — Encounter (HOSPITAL_COMMUNITY)
Admission: RE | Disposition: A | Discharge status: Home / Self Care | Payer: Self-pay | Source: Home / Self Care | Attending: Ophthalmology

## 2022-06-18 DIAGNOSIS — F17210 Nicotine dependence, cigarettes, uncomplicated: Secondary | ICD-10-CM | POA: Diagnosis not present

## 2022-06-18 DIAGNOSIS — H0100A Unspecified blepharitis right eye, upper and lower eyelids: Secondary | ICD-10-CM | POA: Diagnosis not present

## 2022-06-18 DIAGNOSIS — H02834 Dermatochalasis of left upper eyelid: Secondary | ICD-10-CM | POA: Diagnosis not present

## 2022-06-18 DIAGNOSIS — H2521 Age-related cataract, morgagnian type, right eye: Secondary | ICD-10-CM | POA: Insufficient documentation

## 2022-06-18 DIAGNOSIS — I10 Essential (primary) hypertension: Secondary | ICD-10-CM | POA: Diagnosis not present

## 2022-06-18 DIAGNOSIS — K219 Gastro-esophageal reflux disease without esophagitis: Secondary | ICD-10-CM | POA: Insufficient documentation

## 2022-06-18 DIAGNOSIS — H02831 Dermatochalasis of right upper eyelid: Secondary | ICD-10-CM | POA: Insufficient documentation

## 2022-06-18 DIAGNOSIS — G2581 Restless legs syndrome: Secondary | ICD-10-CM | POA: Insufficient documentation

## 2022-06-18 DIAGNOSIS — E1136 Type 2 diabetes mellitus with diabetic cataract: Secondary | ICD-10-CM | POA: Diagnosis present

## 2022-06-18 DIAGNOSIS — H25812 Combined forms of age-related cataract, left eye: Secondary | ICD-10-CM | POA: Insufficient documentation

## 2022-06-18 DIAGNOSIS — I2510 Atherosclerotic heart disease of native coronary artery without angina pectoris: Secondary | ICD-10-CM

## 2022-06-18 DIAGNOSIS — F17200 Nicotine dependence, unspecified, uncomplicated: Secondary | ICD-10-CM | POA: Insufficient documentation

## 2022-06-18 DIAGNOSIS — H0100B Unspecified blepharitis left eye, upper and lower eyelids: Secondary | ICD-10-CM | POA: Insufficient documentation

## 2022-06-18 DIAGNOSIS — H269 Unspecified cataract: Secondary | ICD-10-CM

## 2022-06-18 HISTORY — PX: CATARACT EXTRACTION W/PHACO: SHX586

## 2022-06-18 SURGERY — PHACOEMULSIFICATION, CATARACT, WITH IOL INSERTION
Anesthesia: Monitor Anesthesia Care | Site: Eye | Laterality: Right

## 2022-06-18 MED ORDER — TRYPAN BLUE 0.06 % IO SOSY
PREFILLED_SYRINGE | INTRAOCULAR | Status: AC
  Filled 2022-06-18: qty 0.5

## 2022-06-18 MED ORDER — SODIUM HYALURONATE 10 MG/ML IO SOLUTION
PREFILLED_SYRINGE | INTRAOCULAR | Status: DC | PRN
  Administered 2022-06-18: 0.85 mL via INTRAOCULAR

## 2022-06-18 MED ORDER — EPINEPHRINE PF 1 MG/ML IJ SOLN
INTRAMUSCULAR | Status: AC
  Filled 2022-06-18: qty 2

## 2022-06-18 MED ORDER — PHENYLEPHRINE HCL 2.5 % OP SOLN
1.0000 [drp] | OPHTHALMIC | Status: AC | PRN
  Administered 2022-06-18 (×3): 1 [drp] via OPHTHALMIC

## 2022-06-18 MED ORDER — TRYPAN BLUE 0.06 % IO SOSY
PREFILLED_SYRINGE | INTRAOCULAR | Status: DC | PRN
  Administered 2022-06-18: 0.5 mL via INTRAOCULAR

## 2022-06-18 MED ORDER — STERILE WATER FOR IRRIGATION IR SOLN
Status: DC | PRN
  Administered 2022-06-18: 250 mL

## 2022-06-18 MED ORDER — MIDAZOLAM HCL 2 MG/2ML IJ SOLN
INTRAMUSCULAR | Status: AC
  Filled 2022-06-18: qty 2

## 2022-06-18 MED ORDER — EPINEPHRINE PF 1 MG/ML IJ SOLN
INTRAOCULAR | Status: DC | PRN
  Administered 2022-06-18: 500 mL

## 2022-06-18 MED ORDER — LIDOCAINE HCL 3.5 % OP GEL: 1.0000 | Freq: Once | OPHTHALMIC | Status: DC

## 2022-06-18 MED ORDER — NEOMYCIN-POLYMYXIN-DEXAMETH 3.5-10000-0.1 OP SUSP
OPHTHALMIC | Status: DC | PRN
  Administered 2022-06-18: 1 [drp] via OPHTHALMIC

## 2022-06-18 MED ORDER — TETRACAINE HCL 0.5 % OP SOLN
1.0000 [drp] | OPHTHALMIC | Status: AC | PRN
  Administered 2022-06-18 (×3): 1 [drp] via OPHTHALMIC

## 2022-06-18 MED ORDER — SODIUM HYALURONATE 23MG/ML IO SOSY
PREFILLED_SYRINGE | INTRAOCULAR | Status: DC | PRN
  Administered 2022-06-18: 0.6 mL via INTRAOCULAR

## 2022-06-18 MED ORDER — TROPICAMIDE 1 % OP SOLN
1.0000 [drp] | OPHTHALMIC | Status: AC | PRN
  Administered 2022-06-18 (×3): 1 [drp] via OPHTHALMIC

## 2022-06-18 MED ORDER — LIDOCAINE HCL (PF) 1 % IJ SOLN
INTRAOCULAR | Status: DC | PRN
  Administered 2022-06-18: 1 mL via OPHTHALMIC

## 2022-06-18 MED ORDER — BSS IO SOLN
INTRAOCULAR | Status: DC | PRN
  Administered 2022-06-18: 15 mL via INTRAOCULAR

## 2022-06-18 MED ORDER — POVIDONE-IODINE 5 % OP SOLN
OPHTHALMIC | Status: DC | PRN
  Administered 2022-06-18: 1 via OPHTHALMIC

## 2022-06-18 SURGICAL SUPPLY — 14 items
CATARACT SUITE SIGHTPATH (MISCELLANEOUS) ×2 IMPLANT
CLOTH BEACON ORANGE TIMEOUT ST (SAFETY) ×3 IMPLANT
EYE SHIELD UNIVERSAL CLEAR (GAUZE/BANDAGES/DRESSINGS) ×1 IMPLANT
FEE CATARACT SUITE SIGHTPATH (MISCELLANEOUS) ×2 IMPLANT
GLOVE BIOGEL PI IND STRL 7.0 (GLOVE) ×4 IMPLANT
GLOVE BIOGEL PI INDICATOR 7.0 (GLOVE) ×1
GLOVE SURG SS PI 7.0 STRL IVOR (GLOVE) ×1 IMPLANT
LENS IOL RAYNER 21.5 (Intraocular Lens) ×2 IMPLANT
LENS IOL RAYONE EMV 21.5 (Intraocular Lens) IMPLANT
PAD ARMBOARD 7.5X6 YLW CONV (MISCELLANEOUS) ×3 IMPLANT
SYR TB 1ML LL NO SAFETY (SYRINGE) ×3 IMPLANT
TAPE SURG TRANSPORE 1 IN (GAUZE/BANDAGES/DRESSINGS) IMPLANT
TAPE SURGICAL TRANSPORE 1 IN (GAUZE/BANDAGES/DRESSINGS) ×2
WATER STERILE IRR 250ML POUR (IV SOLUTION) ×3 IMPLANT

## 2022-06-18 NOTE — Op Note (Signed)
Date of procedure: 06/18/22  Pre-operative diagnosis: Mature Visually significant age-related cataract, Right Eye (H25.21)  Post-operative diagnosis: Mature Visually significant age-related cataract, Right Eye  Procedure: Complex Removal of cataract via phacoemulsification and insertion of intra-ocular lens Rayner [id number] +21.5D into the capsular bag of the Right Eye  Attending surgeon: Gerda Diss. Eleah Lahaie, MD, MA  Anesthesia: MAC, Topical Akten  Complications: None  Estimated Blood Loss: <78m (minimal)  Specimens: None  Implants: As above  Indications:  Mature Visually significant age-related cataract, Right Eye  Procedure:  The patient was seen and identified in the pre-operative area. The operative eye was identified and dilated.  The operative eye was marked.  Topical anesthesia was administered to the operative eye.     The patient was then to the operative suite and placed in the supine position.  A timeout was performed confirming the patient, procedure to be performed, and all other relevant information.   The patient's face was prepped and draped in the usual fashion for intra-ocular surgery.  A lid speculum was placed into the operative eye and the surgical microscope moved into place and focused.  A lack of red reflex due to a mature cataract was confirmed.  A superotemporal paracentesis was created using a 20 gauge paracentesis blade.  Vision blue was injected into the anterior chamber.  Shugarcaine was injected into the anterior chamber.  Viscoelastic was injected into the anterior chamber.  A temporal clear-corneal main wound incision was created using a 2.467mmicrokeratome.  A continuous curvilinear capsulorrhexis was initiated using an irrigating cystitome and completed using capsulorrhexis forceps.  Hydrodissection and hydrodeliniation were performed.  Viscoelastic was injected into the anterior chamber.  A phacoemulsification handpiece and a chopper as a second instrument were  used to remove the nucleus and epinucleus. The irrigation/aspiration handpiece was used to remove any remaining cortical material.   The capsular bag was reinflated with viscoelastic, checked, and found that there was an inferonasal anterior capsular tear.  The posterior capsule appeared intact and the anterior capsule leaflets were anteriori. The intraocular lens was inserted into the capsular bag and dialed into place using a kuglen hook.  The irrigation/aspiration handpiece was used to remove any remaining viscoelastic.  The clear corneal wound and paracentesis wounds were then hydrated and checked with Weck-Cels to be watertight.  The lid-speculum and drape was removed, and the patient's face was cleaned with a wet and dry 4x4.  Maxitrol was instilled in the eye before a clear shield was taped over the eye. The patient was taken to the post-operative care unit in good condition, having tolerated the procedure well.  Post-Op Instructions: [facility] and will receive all other orders and instructions.

## 2022-06-18 NOTE — Anesthesia Procedure Notes (Signed)
Procedure Name: MAC Date/Time: 06/18/2022 7:50 AM  Performed by: Orlie Dakin, CRNAPre-anesthesia Checklist: Patient identified, Emergency Drugs available, Suction available and Patient being monitored Patient Re-evaluated:Patient Re-evaluated prior to induction Oxygen Delivery Method: Nasal cannula Placement Confirmation: positive ETCO2

## 2022-06-18 NOTE — Anesthesia Postprocedure Evaluation (Signed)
Anesthesia Post Note  Patient: [name]  Procedure(s) Performed: CATARACT EXTRACTION PHACO AND INTRAOCULAR LENS PLACEMENT (IOC) (Right: Eye)  Patient location during evaluation: Phase II Anesthesia Type: MAC Level of consciousness: awake and alert and oriented Pain management: pain level controlled Vital Signs Assessment: post-procedure vital signs reviewed and stable Respiratory status: spontaneous breathing, nonlabored ventilation and respiratory function stable Cardiovascular status: blood pressure returned to baseline and stable Postop Assessment: no apparent nausea or vomiting Anesthetic complications: no   No notable events documented.   Last Vitals:  Vitals:   06/18/22 0700 06/18/22 0822  BP: 111/64 (!) 114/99  Pulse: (!) 57 76  Resp: 16 16  Temp: 36.8 C 36.7 C  SpO2: 97% 98%    Last Pain:  Vitals:   06/18/22 0822  TempSrc: Oral  PainSc: 0-No pain                 Nikolis Berent C Kendallyn Lippold

## 2022-06-18 NOTE — Transfer of Care (Signed)
Immediate Anesthesia Transfer of Care Note  Patient: [name]  Procedure(s) Performed: CATARACT EXTRACTION PHACO AND INTRAOCULAR LENS PLACEMENT (IOC) (Right: Eye)  Patient Location: Short Stay  Anesthesia Type:MAC  Level of Consciousness: awake, alert  and oriented  Airway & Oxygen Therapy: Patient Spontanous Breathing  Post-op Assessment: Report given to RN and Post -op Vital signs reviewed and stable  Post vital signs: Reviewed and stable  Last Vitals:  Vitals Value Taken Time  BP    Temp    Pulse    Resp    SpO2      Last Pain:  Vitals:   06/18/22 0700  TempSrc: Oral  PainSc: 0-No pain         Complications: No notable events documented.

## 2022-06-18 NOTE — Anesthesia Preprocedure Evaluation (Signed)
Anesthesia Evaluation  Patient identified by MRN, date of birth, ID band Patient awake    Reviewed: Allergy & Precautions, NPO status , Patient's Chart, lab work & pertinent test results, reviewed documented beta blocker date and time   Airway Mallampati: II  TM Distance: >3 FB Neck ROM: Full    Dental  (+) Edentulous Upper, Edentulous Lower   Pulmonary pneumonia, Current Smoker and Patient abstained from smoking.,    Pulmonary exam normal breath sounds clear to auscultation       Cardiovascular Exercise Tolerance: Good hypertension, Pt. on home beta blockers and Pt. on medications + CAD  Normal cardiovascular exam Rhythm:Regular Rate:Normal     Neuro/Psych  Headaches,  Neuromuscular disease (RLS) negative psych ROS   GI/Hepatic Neg liver ROS, GERD  ,  Endo/Other  negative endocrine ROS  Renal/GU Renal disease  negative genitourinary   Musculoskeletal negative musculoskeletal ROS (+)   Abdominal   Peds negative pediatric ROS (+)  Hematology negative hematology ROS (+)   Anesthesia Other Findings   Reproductive/Obstetrics negative OB ROS                             Anesthesia Physical  Anesthesia Plan  ASA: 2  Anesthesia Plan: MAC   Post-op Pain Management: Minimal or no pain anticipated   Induction: Intravenous  PONV Risk Score and Plan:   Airway Management Planned: Nasal Cannula and Natural Airway  Additional Equipment:   Intra-op Plan:   Post-operative Plan:   Informed Consent: I have reviewed the patients History and Physical, chart, labs and discussed the procedure including the risks, benefits and alternatives for the proposed anesthesia with the patient or authorized representative who has indicated his/her understanding and acceptance.       Plan Discussed with: CRNA and Surgeon  Anesthesia Plan Comments:         Anesthesia Quick Evaluation  

## 2022-06-18 NOTE — Interval H&P Note (Signed)
History and Physical Interval Note:  06/18/2022 7:48 AM  [name]  has presented today for surgery, with the diagnosis of cataract hypermature age related cataract; right.  The various methods of treatment have been discussed with the patient and family. After consideration of risks, benefits and other options for treatment, the patient has consented to  Procedure(s) with comments: CATARACT EXTRACTION PHACO AND INTRAOCULAR LENS PLACEMENT (IOC) (Right) - right as a surgical intervention.  The patient's history has been reviewed, patient examined, no change in status, stable for surgery.  I have reviewed the patient's chart and labs.  Questions were answered to the patient's satisfaction.     Baruch Goldmann

## 2022-06-18 NOTE — Discharge Instructions (Signed)
Please discharge patient when stable, will follow up today with Dr. Marisa Hua at the [facility] office at 10:00AM following discharge.  Leave shield in place until visit.  All paperwork with discharge instructions will be given at the office.  [facility] Address:  [address]  [place], [place] [address]

## 2022-06-21 ENCOUNTER — Encounter (HOSPITAL_COMMUNITY): Payer: Self-pay | Admitting: Ophthalmology

## 2022-06-28 NOTE — H&P (Signed)
Surgical History & Physical  Patient Name: [name] DOB: [birth year]  Surgery: Cataract extraction with intraocular lens implant phacoemulsification; Left Eye  Surgeon: Baruch Goldmann MD Surgery Date:  07-02-22 Pre-Op Date:  06-24-22  HPI: A 31 Yr. old female patient 1. The patient is returning after cataract surgery. The right eye is affected. Status post cataract surgery, which began 6 days ago: Since the last visit, the affected area is doing well. The patient's vision is improved. Patient is following medication instructions. 2. 2. The patient is returning for a cataract follow-up of the left eye. The complaint is associated with light sensitivity. Patient complains of difficulty driving at night due to glare/halos. This is negatively affecting the patient's quality of life and the patient is unable to function adequately in life with the current level of vision. HPI was performed by Baruch Goldmann .  Medical History: Cataracts Diabetes High Blood Pressure LDL Lung Problems Skin cancer(Squamous cell), Hip fracture, Hypokale...  Review of Systems Negative Allergic/Immunologic Negative Cardiovascular Negative Constitutional Negative Ear, Nose, Mouth & Throat Negative Endocrine Negative Eyes Negative Gastrointestinal Negative Genitourinary Negative Hemotologic/Lymphatic Negative Integumentary Negative Musculoskeletal Negative Neurological Negative Psychiatry Negative Respiratory  Social   Current every day smoker / less than 1pack.day Per Day  Medication Prednisolone-Moxifloxacin-Bromfenac,  Synthroid, Aspirin, Advil, Atorvastatin, Fish Oil, Vitamin D3, Vitamin B-12,   Sx/Procedures Phaco c IOL OD,  Hip Replacement L, Tonsilectomy/Adeniectomy,   Drug Allergies  Cortisone shot,   History & Physical: Heent: Cataract, left eye NECK: supple without bruits LUNGS: lungs clear to auscultation CV: regular rate and rhythm Abdomen: soft and non-tender Impression &  Plan: Assessment: 1.  CATARACT EXTRACTION STATUS; Right Eye (Z98.41) 2.  COMBINED FORMS AGE RELATED CATARACT; Left Eye (H25.812)  Plan: 1.  1 week after cataract surgery. Doing well with improved vision and normal eye pressure. Call with any problems or concerns. Continue Pred-Moxi-Brom 2x/day for 3 more weeks.  2.  Cataract accounts for the patient's decreased vision. This visual impairment is not correctable with a tolerable change in glasses or contact lenses. Cataract surgery with an implantation of a new lens should significantly improve the visual and functional status of the patient. Discussed all risks, benefits, alternatives, and potential complications. Discussed the procedures and recovery. Patient desires to have surgery. A-scan ordered and performed today for intra-ocular lens calculations. The surgery will be performed in order to improve vision for driving, reading, and for eye examinations. Recommend phacoemulsification with intra-ocular lens. Recommend Dextenza for post-operative pain and inflammation. Left Eye. Surgery required to correct imbalance of vision. Dilates well - shugarcaine by protocol.

## 2022-06-29 ENCOUNTER — Encounter (HOSPITAL_COMMUNITY)
Admission: RE | Admit: 2022-06-29 | Discharge: 2022-06-29 | Disposition: A | Discharge status: Home / Self Care | Payer: Medicare Other | Source: Ambulatory Visit | Attending: Ophthalmology | Admitting: Ophthalmology

## 2022-07-02 ENCOUNTER — Ambulatory Visit (HOSPITAL_COMMUNITY): Payer: Medicare Other | Admitting: Certified Registered Nurse Anesthetist

## 2022-07-02 ENCOUNTER — Encounter (HOSPITAL_COMMUNITY): Payer: Self-pay | Admitting: Ophthalmology

## 2022-07-02 ENCOUNTER — Encounter (HOSPITAL_COMMUNITY)
Admission: RE | Disposition: A | Discharge status: Home / Self Care | Payer: Self-pay | Source: Home / Self Care | Attending: Ophthalmology

## 2022-07-02 ENCOUNTER — Ambulatory Visit (HOSPITAL_BASED_OUTPATIENT_CLINIC_OR_DEPARTMENT_OTHER): Payer: Medicare Other | Admitting: Certified Registered Nurse Anesthetist

## 2022-07-02 ENCOUNTER — Ambulatory Visit (HOSPITAL_COMMUNITY)
Admission: RE | Admit: 2022-07-02 | Discharge: 2022-07-02 | Disposition: A | Discharge status: Home / Self Care | Payer: Medicare Other | Attending: Ophthalmology | Admitting: Ophthalmology

## 2022-07-02 DIAGNOSIS — F17210 Nicotine dependence, cigarettes, uncomplicated: Secondary | ICD-10-CM | POA: Diagnosis not present

## 2022-07-02 DIAGNOSIS — I10 Essential (primary) hypertension: Secondary | ICD-10-CM | POA: Insufficient documentation

## 2022-07-02 DIAGNOSIS — F17200 Nicotine dependence, unspecified, uncomplicated: Secondary | ICD-10-CM | POA: Insufficient documentation

## 2022-07-02 DIAGNOSIS — H25812 Combined forms of age-related cataract, left eye: Secondary | ICD-10-CM | POA: Insufficient documentation

## 2022-07-02 DIAGNOSIS — Z79899 Other long term (current) drug therapy: Secondary | ICD-10-CM | POA: Insufficient documentation

## 2022-07-02 DIAGNOSIS — I2510 Atherosclerotic heart disease of native coronary artery without angina pectoris: Secondary | ICD-10-CM | POA: Insufficient documentation

## 2022-07-02 DIAGNOSIS — E1136 Type 2 diabetes mellitus with diabetic cataract: Secondary | ICD-10-CM | POA: Diagnosis not present

## 2022-07-02 HISTORY — PX: CATARACT EXTRACTION W/PHACO: SHX586

## 2022-07-02 SURGERY — PHACOEMULSIFICATION, CATARACT, WITH IOL INSERTION
Anesthesia: Monitor Anesthesia Care | Site: Eye | Laterality: Left

## 2022-07-02 MED ORDER — POVIDONE-IODINE 5 % OP SOLN
OPHTHALMIC | Status: DC | PRN
  Administered 2022-07-02: 1 via OPHTHALMIC

## 2022-07-02 MED ORDER — EPINEPHRINE PF 1 MG/ML IJ SOLN
INTRAOCULAR | Status: DC | PRN
  Administered 2022-07-02: 500 mL

## 2022-07-02 MED ORDER — BSS IO SOLN
INTRAOCULAR | Status: DC | PRN
  Administered 2022-07-02: 15 mL via INTRAOCULAR

## 2022-07-02 MED ORDER — TRYPAN BLUE 0.06 % IO SOSY
PREFILLED_SYRINGE | INTRAOCULAR | Status: AC
  Filled 2022-07-02: qty 0.5

## 2022-07-02 MED ORDER — NEOMYCIN-POLYMYXIN-DEXAMETH 3.5-10000-0.1 OP SUSP
OPHTHALMIC | Status: DC | PRN
  Administered 2022-07-02: 2 [drp] via OPHTHALMIC

## 2022-07-02 MED ORDER — STERILE WATER FOR IRRIGATION IR SOLN
Status: DC | PRN
  Administered 2022-07-02: 250 mL

## 2022-07-02 MED ORDER — TROPICAMIDE 1 % OP SOLN
1.0000 [drp] | OPHTHALMIC | Status: AC | PRN
  Administered 2022-07-02 (×3): 1 [drp] via OPHTHALMIC

## 2022-07-02 MED ORDER — TETRACAINE HCL 0.5 % OP SOLN
1.0000 [drp] | OPHTHALMIC | Status: AC | PRN
Start: 2022-07-02 — End: 2022-07-02
  Administered 2022-07-02 (×3): 1 [drp] via OPHTHALMIC

## 2022-07-02 MED ORDER — EPINEPHRINE PF 1 MG/ML IJ SOLN
INTRAMUSCULAR | Status: AC
  Filled 2022-07-02: qty 2

## 2022-07-02 MED ORDER — LIDOCAINE HCL 3.5 % OP GEL
1.0000 | Freq: Once | OPHTHALMIC | Status: AC
  Administered 2022-07-02: 1 via OPHTHALMIC

## 2022-07-02 MED ORDER — LIDOCAINE HCL (PF) 1 % IJ SOLN
INTRAOCULAR | Status: DC | PRN
  Administered 2022-07-02: 1 mL via OPHTHALMIC

## 2022-07-02 MED ORDER — PHENYLEPHRINE HCL 2.5 % OP SOLN
1.0000 [drp] | OPHTHALMIC | Status: AC | PRN
  Administered 2022-07-02 (×3): 1 [drp] via OPHTHALMIC

## 2022-07-02 MED ORDER — SODIUM HYALURONATE 10 MG/ML IO SOLUTION
PREFILLED_SYRINGE | INTRAOCULAR | Status: DC | PRN
  Administered 2022-07-02: 0.85 mL via INTRAOCULAR

## 2022-07-02 MED ORDER — SODIUM HYALURONATE 23MG/ML IO SOSY
PREFILLED_SYRINGE | INTRAOCULAR | Status: DC | PRN
  Administered 2022-07-02: 0.6 mL via INTRAOCULAR

## 2022-07-02 SURGICAL SUPPLY — 15 items
CATARACT SUITE SIGHTPATH (MISCELLANEOUS) ×2 IMPLANT
CLOTH BEACON ORANGE TIMEOUT ST (SAFETY) ×3 IMPLANT
EYE SHIELD UNIVERSAL CLEAR (GAUZE/BANDAGES/DRESSINGS) ×1 IMPLANT
FEE CATARACT SUITE SIGHTPATH (MISCELLANEOUS) ×2 IMPLANT
GLOVE BIOGEL PI IND STRL 6.5 (GLOVE) IMPLANT
GLOVE BIOGEL PI IND STRL 7.0 (GLOVE) ×4 IMPLANT
GLOVE BIOGEL PI INDICATOR 6.5 (GLOVE) ×1
GLOVE BIOGEL PI INDICATOR 7.0 (GLOVE) ×2
LENS IOL RAYNER 21.0 (Intraocular Lens) ×2 IMPLANT
LENS IOL RAYONE EMV 21.0 (Intraocular Lens) IMPLANT
PAD ARMBOARD 7.5X6 YLW CONV (MISCELLANEOUS) ×3 IMPLANT
SYR TB 1ML LL NO SAFETY (SYRINGE) ×3 IMPLANT
TAPE SURG TRANSPORE 1 IN (GAUZE/BANDAGES/DRESSINGS) IMPLANT
TAPE SURGICAL TRANSPORE 1 IN (GAUZE/BANDAGES/DRESSINGS) ×2
WATER STERILE IRR 250ML POUR (IV SOLUTION) ×3 IMPLANT

## 2022-07-02 NOTE — Anesthesia Preprocedure Evaluation (Signed)
Anesthesia Evaluation  Patient identified by MRN, date of birth, ID band Patient awake    Reviewed: Allergy & Precautions, NPO status , Patient's Chart, lab work & pertinent test results, reviewed documented beta blocker date and time   Airway Mallampati: II  TM Distance: >3 FB Neck ROM: Full    Dental  (+) Edentulous Upper, Edentulous Lower   Pulmonary pneumonia, Current Smoker and Patient abstained from smoking.,    Pulmonary exam normal breath sounds clear to auscultation       Cardiovascular Exercise Tolerance: Good hypertension, Pt. on home beta blockers and Pt. on medications + CAD  Normal cardiovascular exam Rhythm:Regular Rate:Normal     Neuro/Psych  Headaches,  Neuromuscular disease (RLS) negative psych ROS   GI/Hepatic Neg liver ROS, GERD  ,  Endo/Other  negative endocrine ROS  Renal/GU Renal disease  negative genitourinary   Musculoskeletal negative musculoskeletal ROS (+)   Abdominal   Peds negative pediatric ROS (+)  Hematology negative hematology ROS (+)   Anesthesia Other Findings   Reproductive/Obstetrics negative OB ROS                             Anesthesia Physical  Anesthesia Plan  ASA: 2  Anesthesia Plan: MAC   Post-op Pain Management: Minimal or no pain anticipated   Induction: Intravenous  PONV Risk Score and Plan:   Airway Management Planned: Nasal Cannula and Natural Airway  Additional Equipment:   Intra-op Plan:   Post-operative Plan:   Informed Consent: I have reviewed the patients History and Physical, chart, labs and discussed the procedure including the risks, benefits and alternatives for the proposed anesthesia with the patient or authorized representative who has indicated his/her understanding and acceptance.       Plan Discussed with: CRNA and Surgeon  Anesthesia Plan Comments:         Anesthesia Quick Evaluation

## 2022-07-02 NOTE — Transfer of Care (Signed)
Immediate Anesthesia Transfer of Care Note  Patient: [name] DOBOSZ  Procedure(s) Performed: CATARACT EXTRACTION PHACO AND INTRAOCULAR LENS PLACEMENT (IOC) (Left: Eye)  Patient Location: PACU  Anesthesia Type:MAC  Level of Consciousness: awake, alert  and oriented  Airway & Oxygen Therapy: Patient Spontanous Breathing  Post-op Assessment: Report given to RN and Post -op Vital signs reviewed and stable  Post vital signs: Reviewed and stable  Last Vitals:  Vitals Value Taken Time  BP 148/70   Temp    Pulse 52   Resp 15   SpO2 97%     Last Pain:  Vitals:   07/02/22 0930  TempSrc: Oral  PainSc: 0-No pain         Complications: No notable events documented.

## 2022-07-02 NOTE — Discharge Instructions (Addendum)
Please discharge patient when stable, will follow up today with Dr. Wrzosek at the [facility] office immediately following discharge.  Leave shield in place until visit.  All paperwork with discharge instructions will be given at the office.  [facility] Address:  [address]  [place], [place] [address]  

## 2022-07-02 NOTE — Anesthesia Postprocedure Evaluation (Signed)
Anesthesia Post Note  Patient: [name]  Procedure(s) Performed: CATARACT EXTRACTION PHACO AND INTRAOCULAR LENS PLACEMENT (IOC) (Left: Eye)  Patient location during evaluation: Phase II Anesthesia Type: MAC Level of consciousness: awake Pain management: pain level controlled Vital Signs Assessment: post-procedure vital signs reviewed and stable Respiratory status: spontaneous breathing and respiratory function stable Cardiovascular status: blood pressure returned to baseline and stable Postop Assessment: no headache and no apparent nausea or vomiting Anesthetic complications: no Comments: Late entry   No notable events documented.   Last Vitals:  Vitals:   07/02/22 0930 07/02/22 1107  BP: (!) 109/57 127/79  Pulse: (!) 54 (!) 54  Resp: 19 16  Temp: 36.7 C 36.9 C  SpO2: 96% 100%    Last Pain:  Vitals:   07/02/22 1107  TempSrc: Oral  PainSc: 0-No pain                 Louann Sjogren

## 2022-07-02 NOTE — Interval H&P Note (Signed)
History and Physical Interval Note:  07/02/2022 10:40 AM  [name]  has presented today for surgery, with the diagnosis of combined forms age related cataract; left.  The various methods of treatment have been discussed with the patient and family. After consideration of risks, benefits and other options for treatment, the patient has consented to  Procedure(s) with comments: CATARACT EXTRACTION PHACO AND INTRAOCULAR LENS PLACEMENT (IOC) (Left) - CDE:  as a surgical intervention.  The patient's history has been reviewed, patient examined, no change in status, stable for surgery.  I have reviewed the patient's chart and labs.  Questions were answered to the patient's satisfaction.     Baruch Goldmann

## 2022-07-02 NOTE — Op Note (Signed)
Date of procedure: 07/02/22  Pre-operative diagnosis: Visually significant age-related combined cataract, Left Eye (H25.812)  Post-operative diagnosis: Visually significant age-related combined cataract, Left Eye (H25.812)  Procedure: Removal of cataract via phacoemulsification and insertion of intra-ocular lens Rayner [id number] +21.0D into the capsular bag of the Left Eye  Attending surgeon: Gerda Diss. Ceci Taliaferro, MD, MA  Anesthesia: MAC, Topical Akten  Complications: None  Estimated Blood Loss: <72m (minimal)  Specimens: None  Implants: As above  Indications:  Visually significant age-related cataract, Left Eye  Procedure:  The patient was seen and identified in the pre-operative area. The operative eye was identified and dilated.  The operative eye was marked.  Topical anesthesia was administered to the operative eye.     The patient was then to the operative suite and placed in the supine position.  A timeout was performed confirming the patient, procedure to be performed, and all other relevant information.   The patient's face was prepped and draped in the usual fashion for intra-ocular surgery.  A lid speculum was placed into the operative eye and the surgical microscope moved into place and focused.  An inferotemporal paracentesis was created using a 20 gauge paracentesis blade.  Shugarcaine was injected into the anterior chamber.  Viscoelastic was injected into the anterior chamber.  A temporal clear-corneal main wound incision was created using a 2.455mmicrokeratome.  A continuous curvilinear capsulorrhexis was initiated using an irrigating cystitome and completed using capsulorrhexis forceps.  Hydrodissection and hydrodeliniation were performed.  Viscoelastic was injected into the anterior chamber.  A phacoemulsification handpiece and a chopper as a second instrument were used to remove the nucleus and epinucleus. The irrigation/aspiration handpiece was used to remove any remaining  cortical material.   The capsular bag was reinflated with viscoelastic, checked, and found to be intact.  The intraocular lens was inserted into the capsular bag.  The irrigation/aspiration handpiece was used to remove any remaining viscoelastic.  The clear corneal wound and paracentesis wounds were then hydrated and checked with Weck-Cels to be watertight.  Maxitrol was instilled in the eye. The lid-speculum was removed.  The drape was removed.  The patient's face was cleaned with a wet and dry 4x4.    A clear shield was taped over the eye. The patient was taken to the post-operative care unit in good condition, having tolerated the procedure well.  Post-Op Instructions: [facility] and will receive all other orders and instructions.

## 2022-07-05 ENCOUNTER — Encounter (HOSPITAL_COMMUNITY): Payer: Self-pay | Admitting: Ophthalmology

## 2022-11-12 DIAGNOSIS — M25551 Pain in right hip: Secondary | ICD-10-CM | POA: Insufficient documentation

## 2022-11-12 DIAGNOSIS — R059 Cough, unspecified: Secondary | ICD-10-CM | POA: Insufficient documentation

## 2022-11-12 DIAGNOSIS — Z9181 History of falling: Secondary | ICD-10-CM | POA: Insufficient documentation

## 2022-11-12 DIAGNOSIS — M25552 Pain in left hip: Secondary | ICD-10-CM | POA: Insufficient documentation

## 2022-12-06 ENCOUNTER — Other Ambulatory Visit (HOSPITAL_COMMUNITY): Payer: Self-pay | Admitting: Adult Health

## 2022-12-06 ENCOUNTER — Ambulatory Visit (HOSPITAL_COMMUNITY)
Admission: RE | Admit: 2022-12-06 | Discharge: 2022-12-06 | Disposition: A | Discharge status: Home / Self Care | Payer: Medicare Other | Source: Ambulatory Visit | Attending: Adult Health | Admitting: Adult Health

## 2022-12-06 DIAGNOSIS — M25551 Pain in right hip: Secondary | ICD-10-CM

## 2022-12-06 DIAGNOSIS — M25552 Pain in left hip: Secondary | ICD-10-CM

## 2023-08-09 ENCOUNTER — Ambulatory Visit (HOSPITAL_COMMUNITY)
Admission: RE | Admit: 2023-08-09 | Discharge: 2023-08-09 | Disposition: A | Discharge status: Home / Self Care | Payer: Medicare Other | Source: Ambulatory Visit | Attending: Adult Health | Admitting: Adult Health

## 2023-08-09 ENCOUNTER — Other Ambulatory Visit (HOSPITAL_COMMUNITY): Payer: Self-pay | Admitting: Adult Health

## 2023-08-09 DIAGNOSIS — R053 Chronic cough: Secondary | ICD-10-CM | POA: Diagnosis present

## 2024-03-02 ENCOUNTER — Inpatient Hospital Stay (HOSPITAL_COMMUNITY)
Admission: EM | Admit: 2024-03-02 | Discharge: 2024-03-04 | DRG: 482 | Disposition: A | Discharge status: Home Health | Attending: Internal Medicine | Admitting: Internal Medicine

## 2024-03-02 ENCOUNTER — Emergency Department (HOSPITAL_COMMUNITY)

## 2024-03-02 ENCOUNTER — Encounter (HOSPITAL_COMMUNITY): Payer: Self-pay | Admitting: *Deleted

## 2024-03-02 ENCOUNTER — Other Ambulatory Visit: Payer: Self-pay

## 2024-03-02 DIAGNOSIS — Z9049 Acquired absence of other specified parts of digestive tract: Secondary | ICD-10-CM | POA: Diagnosis not present

## 2024-03-02 DIAGNOSIS — S72001A Fracture of unspecified part of neck of right femur, initial encounter for closed fracture: Principal | ICD-10-CM | POA: Diagnosis present

## 2024-03-02 DIAGNOSIS — Z87891 Personal history of nicotine dependence: Secondary | ICD-10-CM | POA: Diagnosis not present

## 2024-03-02 DIAGNOSIS — Z7982 Long term (current) use of aspirin: Secondary | ICD-10-CM

## 2024-03-02 DIAGNOSIS — F17201 Nicotine dependence, unspecified, in remission: Secondary | ICD-10-CM

## 2024-03-02 DIAGNOSIS — Z888 Allergy status to other drugs, medicaments and biological substances status: Secondary | ICD-10-CM | POA: Diagnosis not present

## 2024-03-02 DIAGNOSIS — Z90710 Acquired absence of both cervix and uterus: Secondary | ICD-10-CM | POA: Diagnosis not present

## 2024-03-02 DIAGNOSIS — W19XXXA Unspecified fall, initial encounter: Secondary | ICD-10-CM | POA: Diagnosis not present

## 2024-03-02 DIAGNOSIS — W010XXA Fall on same level from slipping, tripping and stumbling without subsequent striking against object, initial encounter: Secondary | ICD-10-CM | POA: Diagnosis present

## 2024-03-02 DIAGNOSIS — Z79899 Other long term (current) drug therapy: Secondary | ICD-10-CM

## 2024-03-02 DIAGNOSIS — Z85828 Personal history of other malignant neoplasm of skin: Secondary | ICD-10-CM | POA: Diagnosis not present

## 2024-03-02 DIAGNOSIS — E785 Hyperlipidemia, unspecified: Secondary | ICD-10-CM | POA: Diagnosis present

## 2024-03-02 DIAGNOSIS — Y92009 Unspecified place in unspecified non-institutional (private) residence as the place of occurrence of the external cause: Secondary | ICD-10-CM | POA: Diagnosis not present

## 2024-03-02 DIAGNOSIS — J45909 Unspecified asthma, uncomplicated: Secondary | ICD-10-CM | POA: Diagnosis present

## 2024-03-02 DIAGNOSIS — Z96642 Presence of left artificial hip joint: Secondary | ICD-10-CM | POA: Diagnosis present

## 2024-03-02 DIAGNOSIS — Z79890 Hormone replacement therapy (postmenopausal): Secondary | ICD-10-CM

## 2024-03-02 DIAGNOSIS — I10 Essential (primary) hypertension: Secondary | ICD-10-CM | POA: Diagnosis present

## 2024-03-02 DIAGNOSIS — I2510 Atherosclerotic heart disease of native coronary artery without angina pectoris: Secondary | ICD-10-CM | POA: Diagnosis present

## 2024-03-02 LAB — COMPREHENSIVE METABOLIC PANEL WITH GFR
ALT: 20 U/L (ref 0–44)
AST: 23 U/L (ref 15–41)
Albumin: 3.9 g/dL (ref 3.5–5.0)
Alkaline Phosphatase: 101 U/L (ref 38–126)
Anion gap: 14 (ref 5–15)
BUN: 10 mg/dL (ref 8–23)
CO2: 21 mmol/L — ABNORMAL LOW (ref 22–32)
Calcium: 9.8 mg/dL (ref 8.9–10.3)
Chloride: 97 mmol/L — ABNORMAL LOW (ref 98–111)
Creatinine, Ser: 0.81 mg/dL (ref 0.44–1.00)
GFR, Estimated: >60 mL/min (ref >60)
Glucose, Bld: 117 mg/dL — ABNORMAL HIGH (ref 70–99)
Potassium: 3.2 mmol/L — ABNORMAL LOW (ref 3.5–5.1)
Sodium: 132 mmol/L — ABNORMAL LOW (ref 135–145)
Total Bilirubin: 0.9 mg/dL (ref 0.0–1.2)
Total Protein: 7.5 g/dL (ref 6.5–8.1)

## 2024-03-02 LAB — CBC
HCT: 34.8 % — ABNORMAL LOW (ref 36.0–46.0)
Hemoglobin: 12.2 g/dL (ref 12.0–15.0)
MCH: 33.4 pg (ref 26.0–34.0)
MCHC: 35.1 g/dL (ref 30.0–36.0)
MCV: 95.3 fL (ref 80.0–100.0)
Platelets: 295 10*3/uL (ref 150–400)
RBC: 3.65 MIL/uL — ABNORMAL LOW (ref 3.87–5.11)
RDW: 12.5 % (ref 11.5–15.5)
WBC: 7.7 10*3/uL (ref 4.0–10.5)
nRBC: 0 % (ref 0.0–0.2)

## 2024-03-02 MED ORDER — CEFAZOLIN SODIUM-DEXTROSE 2-4 GM/100ML-% IV SOLN
2.0000 g | INTRAVENOUS | Status: AC
  Administered 2024-03-03: 2 g via INTRAVENOUS
  Filled 2024-03-02: qty 100

## 2024-03-02 MED ORDER — METOPROLOL TARTRATE 50 MG PO TABS: 100.0000 mg | ORAL_TABLET | Freq: Two times a day (BID) | ORAL | Status: DC

## 2024-03-02 MED ORDER — ONDANSETRON HCL 4 MG PO TABS: 4.0000 mg | ORAL_TABLET | Freq: Four times a day (QID) | ORAL | Status: DC | PRN

## 2024-03-02 MED ORDER — HYDROMORPHONE HCL 1 MG/ML IJ SOLN
0.5000 mg | INTRAMUSCULAR | Status: DC | PRN
  Administered 2024-03-02 – 2024-03-04 (×6): 0.5 mg via INTRAVENOUS
  Filled 2024-03-02 (×6): qty 0.5

## 2024-03-02 MED ORDER — ONDANSETRON HCL 4 MG/2ML IJ SOLN
4.0000 mg | Freq: Four times a day (QID) | INTRAMUSCULAR | Status: DC | PRN
Start: 2024-03-02 — End: 2024-03-04
  Administered 2024-03-03: 4 mg via INTRAVENOUS

## 2024-03-02 MED ORDER — ATORVASTATIN CALCIUM 20 MG PO TABS
20.0000 mg | ORAL_TABLET | Freq: Every day | ORAL | Status: DC
  Administered 2024-03-03 – 2024-03-04 (×2): 20 mg via ORAL
  Filled 2024-03-02 (×2): qty 1

## 2024-03-02 MED ORDER — BENZONATATE 100 MG PO CAPS
100.0000 mg | ORAL_CAPSULE | Freq: Three times a day (TID) | ORAL | Status: DC | PRN
  Administered 2024-03-02 – 2024-03-04 (×2): 100 mg via ORAL
  Filled 2024-03-02 (×2): qty 1

## 2024-03-02 MED ORDER — AMLODIPINE BESYLATE 5 MG PO TABS
5.0000 mg | ORAL_TABLET | Freq: Every day | ORAL | Status: DC
  Administered 2024-03-03: 5 mg via ORAL
  Filled 2024-03-02: qty 1

## 2024-03-02 MED ORDER — MONTELUKAST SODIUM 10 MG PO TABS
10.0000 mg | ORAL_TABLET | Freq: Every day | ORAL | Status: DC
  Administered 2024-03-02 – 2024-03-03 (×2): 10 mg via ORAL
  Filled 2024-03-02 (×2): qty 1

## 2024-03-02 MED ORDER — ALBUTEROL SULFATE (2.5 MG/3ML) 0.083% IN NEBU
3.0000 mL | INHALATION_SOLUTION | Freq: Four times a day (QID) | RESPIRATORY_TRACT | Status: DC | PRN
  Administered 2024-03-04: 3 mL via RESPIRATORY_TRACT
  Filled 2024-03-02 (×2): qty 3

## 2024-03-02 MED ORDER — FLUTICASONE PROPIONATE 50 MCG/ACT NA SUSP
1.0000 | Freq: Every day | NASAL | Status: DC
  Administered 2024-03-02 – 2024-03-04 (×3): 1 via NASAL
  Filled 2024-03-02: qty 16

## 2024-03-02 MED ORDER — LISINOPRIL 10 MG PO TABS: 20.0000 mg | ORAL_TABLET | Freq: Every day | ORAL | Status: DC

## 2024-03-02 NOTE — H&P (Signed)
 History and Physical    Patient: [name] ZOX:[id number] DOB: May 20, 1956 DOA: 03/02/2024 DOS: the patient was seen and examined on 03/02/2024 PCP: Kara Pacer, NP  Patient coming from: Home  Chief Complaint:  Chief Complaint  Patient presents with   Hip Pain   HPI: [name] is a 68 y.o. female with medical history significant of CAD, HTN, hyperlipidemia. Patient had mechanical fall last night, landed on right hip. Had some pain, but immediately got up and was walking on it. This morning, had difficulty getting up and having increasing pain. Currently sitting up in chair and is pain free if not moving. Movement increases pain. No fevers, chills, nausea, vomiting, chest pain, SOB.    Review of Systems: As mentioned in the history of present illness. All other systems reviewed and are negative. Past Medical History:  Diagnosis Date   Coronary artery disease    Endometriosis    Hyperlipidemia    Hypertension    Hypotension    Ruptured cervical disc    Skin cancer    Past Surgical History:  Procedure Laterality Date   ABDOMINAL HYSTERECTOMY     ANTERIOR APPROACH HEMI HIP ARTHROPLASTY Left 05/29/2016   Procedure: ANTERIOR APPROACH TOTAL HIP ARTHROPLASTY ;  Surgeon: Kathryne Hitch, MD;  Location: [facility] OR;  Service: Orthopedics;  Laterality: Left;   ARTERY BIOPSY Right 05/03/2019   Procedure: MINOR BIOPSY TEMPORAL ARTERY;  Surgeon: Lucretia Roers, MD;  Location: [facility] ORS;  Service: General;  Laterality: Right;   CARDIAC CATHETERIZATION     CATARACT EXTRACTION W/PHACO Right 06/18/2022   Procedure: CATARACT EXTRACTION PHACO AND INTRAOCULAR LENS PLACEMENT (IOC);  Surgeon: Fabio Pierce, MD;  Location: [facility] ORS;  Service: Ophthalmology;  Laterality: Right;  CDE 115.90   CATARACT EXTRACTION W/PHACO Left 07/02/2022   Procedure: CATARACT EXTRACTION PHACO AND INTRAOCULAR LENS PLACEMENT (IOC);  Surgeon: Fabio Pierce, MD;  Location: [facility] ORS;  Service: Ophthalmology;  Laterality:  Left;  CDE: 5.85   CHOLECYSTECTOMY     TONSILLECTOMY     Social History:  reports that she quit smoking about 4 years ago. Her smoking use included cigarettes. She has never used smokeless tobacco. She reports current alcohol use. She reports that she does not currently use drugs.  Allergies  Allergen Reactions   Cortisone     REACTION: Fluid retention and hives    Family History  Problem Relation Age of Onset   Cancer Mother    Cancer Father     Prior to Admission medications   Medication Sig Start Date End Date Taking? Authorizing Provider  amLODipine (NORVASC) 5 MG tablet Take 1 tablet (5 mg total) by mouth daily. 01/09/20   Jacquelin Hawking, PA-C  aspirin 81 MG chewable tablet Chew 81 mg by mouth daily.    [provider]  atorvastatin (LIPITOR) 20 MG tablet Take 1 tablet (20 mg total) by mouth daily. 01/09/20   Jacquelin Hawking, PA-C  lisinopril (ZESTRIL) 20 MG tablet Take 1 tablet (20 mg total) by mouth daily. 01/09/20   Jacquelin Hawking, PA-C  metoprolol tartrate (LOPRESSOR) 100 MG tablet Take 1 tablet (100 mg total) by mouth 2 (two) times daily. 01/09/20   Jacquelin Hawking, PA-C    Physical Exam: Vitals:   03/02/24 1005 03/02/24 1007 03/02/24 1115  BP:  (!) 159/85 (!) 140/91  Pulse:  71 76  Resp:  14   Temp:  98.6 F (37 C)   TempSrc:  Oral   SpO2:  98% 97%  Weight: 73.9 kg    Height: 5' 5.5" (1.664 m)     General: Elderly female. Awake and alert and oriented x3. No acute cardiopulmonary distress.  HEENT: Normocephalic atraumatic.  Right and left ears normal in appearance.  Pupils equal, round, reactive to light. Extraocular muscles are intact. Sclerae anicteric and noninjected.  Moist mucosal membranes. No mucosal lesions.  Neck: Neck supple without lymphadenopathy. No carotid bruits. No masses palpated.  Cardiovascular: Regular rate with normal S1-S2 sounds. No murmurs, rubs, gallops auscultated. No JVD.  Respiratory: Good respiratory effort with no wheezes,  rales, rhonchi. Lungs clear to auscultation bilaterally.  No accessory muscle use. Abdomen: Soft, nontender, nondistended. Active bowel sounds. No masses or hepatosplenomegaly  Skin: No rashes, lesions, or ulcerations.  Dry, warm to touch. 2+ dorsalis pedis and radial pulses. Musculoskeletal: No calf or leg pain. All major joints not erythematous nontender.  No upper or lower joint deformation.  Good ROM.  No contractures  Psychiatric: Intact judgment and insight. Pleasant and cooperative. Neurologic: No focal neurological deficits. Strength is 5/5 and symmetric in upper and lower extremities.  Cranial nerves II through XII are grossly intact.  Data Reviewed: No results found for this or any previous visit (from the past 24 hours).  CT Hip Right Wo Contrast Result Date: 03/02/2024 CLINICAL DATA:  Right hip pain after fall. EXAM: CT OF THE RIGHT HIP WITHOUT CONTRAST TECHNIQUE: Multidetector CT imaging of the right hip was performed according to the standard protocol. Multiplanar CT image reconstructions were also generated. RADIATION DOSE REDUCTION: This exam was performed according to the departmental dose-optimization program which includes automated exposure control, adjustment of the mA and/or kV according to patient size and/or use of iterative reconstruction technique. COMPARISON:  Right hip radiographs dated 03/02/2024. FINDINGS: Bones/Joint/Cartilage There is a nondisplaced mildly impacted fracture of the right femoral head and neck junction, predominantly laterally (series 6, images 65-74 and series 3, images 56 and 64). The remainder of the visualized bones are intact. No dislocation. Sacroiliac joints and pubic symphysis are intact. Muscles and Tendons No intramuscular fluid collection. Soft tissue No fluid collection or hematoma. Visualized intrapelvic contents are unremarkable. Atherosclerotic vascular calcification. IMPRESSION: Nondisplaced mildly impacted fracture of the right femoral head  and neck junction, most predominant laterally. Electronically Signed   By: Hart Robinsons M.D.   On: 03/02/2024 13:17   DG Hip Unilat  With Pelvis 2-3 Views Right Result Date: 03/02/2024 CLINICAL DATA:  Patient fell last night.  Right hip pain. EXAM: DG HIP (WITH OR WITHOUT PELVIS) 2-3V RIGHT COMPARISON:  12/06/2022 FINDINGS: Bones are diffusely demineralized. SI joints and symphysis pubis unremarkable. No evidence for pubic ramus fracture. Status post left total hip replacement, incompletely visualized. AP and frog-leg lateral views of the right hip show evidence of impaction at the junction of the femoral neck and femoral head laterally, new in the interval since prior study. No discrete fracture line is evident by x-ray. IMPRESSION: Evidence for impaction at the junction of the femoral neck and femoral head laterally, suspicious for fracture. No discrete fracture line is evident by x-ray. CT may be able to definitively characterize this although in the setting of incomplete fracture, MRI would be a more sensitive study with which to confirm. Electronically Signed   By: Kennith Center M.D.   On: 03/02/2024 11:07     Assessment and Plan: No notes have been filed under this hospital service. Service: Hospitalist  Principal Problem:   Closed right hip fracture (HCC) Active Problems:  Coronary atherosclerosis   Asthma   Hypertension   Tobacco abuse, in remission   Screening for breast cancer  Closed right hip fracture Pain controlled N.p.o. after midnight Check CBC and CMP Check EKG Plan is for surgery tomorrow No pharmacological prophylaxis for DVT, will start mechanical prophylaxis HTN Continue antihypertensives CAD H/o tobacco abuse   Advance Care Planning:   Code Status: Prior Full code  Consults: Ortho  Family Communication: none  Severity of Illness: The appropriate patient status for this patient is INPATIENT. Inpatient status is judged to be reasonable and necessary in  order to provide the required intensity of service to ensure the patient's safety. The patient's presenting symptoms, physical exam findings, and initial radiographic and laboratory data in the context of their chronic comorbidities is felt to place them at high risk for further clinical deterioration. Furthermore, it is not anticipated that the patient will be medically stable for discharge from the hospital within 2 midnights of admission.   * I certify that at the point of admission it is my clinical judgment that the patient will require inpatient hospital care spanning beyond 2 midnights from the point of admission due to high intensity of service, high risk for further deterioration and high frequency of surveillance required.*  Author: Levie Heritage, DO 03/02/2024 3:13 PM  For on call review [email or web address].

## 2024-03-02 NOTE — ED Triage Notes (Addendum)
 Pt c/o right hip pain after she missed her step while walking to the bathroom today and fell landing on her right hip last night. Pt reports she is able to slightly ambulate, but with much difficulty. Denies hitting her head and use of blood thinners.

## 2024-03-02 NOTE — ED Provider Notes (Signed)
 [place] EMERGENCY DEPARTMENT AT [facility] Provider Note   CSN: [id number] Arrival date & time: 03/02/24  4098     History  Chief Complaint  Patient presents with   Hip Pain    JUELLE DICKMANN is a 68 y.o. female.   Hip Pain Pertinent negatives include no chest pain, no abdominal pain, no headaches and no shortness of breath.       YARROW LINHART is a 68 y.o. female past medical history of asthma, coronary artery disease, hypertension, who presents to the Emergency Department complaining of right hip pain after mechanical fall that occurred last evening.  She states that she missed a step while attempting to go to the restroom, fell landing on her right hip area.  Initially had very little pain but woke this morning pain had greatly increased she was unable to bear weight or move her right leg.  She denies any head injury, back pain, neck pain or LOC.  Does not take blood thinners.    Home Medications Prior to Admission medications   Medication Sig Start Date End Date Taking? Authorizing Provider  amLODipine (NORVASC) 5 MG tablet Take 1 tablet (5 mg total) by mouth daily. 01/09/20   Jacquelin Hawking, PA-C  aspirin 81 MG chewable tablet Chew 81 mg by mouth daily.    [provider]  atorvastatin (LIPITOR) 20 MG tablet Take 1 tablet (20 mg total) by mouth daily. 01/09/20   Jacquelin Hawking, PA-C  lisinopril (ZESTRIL) 20 MG tablet Take 1 tablet (20 mg total) by mouth daily. 01/09/20   Jacquelin Hawking, PA-C  metoprolol tartrate (LOPRESSOR) 100 MG tablet Take 1 tablet (100 mg total) by mouth 2 (two) times daily. 01/09/20   Jacquelin Hawking, PA-C      Allergies    Cortisone    Review of Systems   Review of Systems  Constitutional:  Negative for appetite change and fever.  Respiratory:  Negative for shortness of breath.   Cardiovascular:  Negative for chest pain.  Gastrointestinal:  Negative for abdominal pain, nausea and vomiting.  Genitourinary:  Negative for  dysuria and flank pain.  Musculoskeletal:  Positive for arthralgias (Right hip pain). Negative for back pain and neck pain.  Skin:  Negative for wound.  Neurological:  Negative for dizziness, weakness, numbness and headaches.    Physical Exam Updated Vital Signs BP (!) 140/91 (BP Location: Right Arm)   Pulse 76   Temp 98.6 F (37 C) (Oral)   Resp 14   Ht 5' 5.5" (1.664 m)   Wt 73.9 kg   SpO2 97%   BMI 26.71 kg/m  Physical Exam Vitals and nursing note reviewed.  Constitutional:      General: She is not in acute distress.    Appearance: Normal appearance. She is not ill-appearing or toxic-appearing.  HENT:     Head: Atraumatic.  Cardiovascular:     Rate and Rhythm: Normal rate and regular rhythm.     Pulses: Normal pulses.  Pulmonary:     Effort: Pulmonary effort is normal.  Abdominal:     Palpations: Abdomen is soft.     Tenderness: There is no abdominal tenderness.  Musculoskeletal:        General: Tenderness and signs of injury present. No swelling or deformity.     Cervical back: Normal range of motion.     Right hip: Tenderness present. No deformity or crepitus. Decreased range of motion. Normal strength.     Right lower leg: No  edema.     Left lower leg: No edema.     Comments: Pain with range of motion right hip.  No bony deformity noted.  Pain reproduced with attempted weightbearing.  Skin:    General: Skin is warm.     Capillary Refill: Capillary refill takes less than 2 seconds.  Neurological:     General: No focal deficit present.     Mental Status: She is alert.     Sensory: No sensory deficit.     Motor: No weakness.     ED Results / Procedures / Treatments   Labs (all labs ordered are listed, but only abnormal results are displayed) Labs Reviewed  CBC - Abnormal; Notable for the following components:      Result Value   RBC 3.65 (*)    HCT 34.8 (*)    All other components within normal limits  COMPREHENSIVE METABOLIC PANEL WITH GFR - Abnormal;  Notable for the following components:   Sodium 132 (*)    Potassium 3.2 (*)    Chloride 97 (*)    CO2 21 (*)    Glucose, Bld 117 (*)    All other components within normal limits  HIV ANTIBODY (ROUTINE TESTING W REFLEX)    EKG None  Radiology CT Hip Right Wo Contrast Result Date: 03/02/2024 CLINICAL DATA:  Right hip pain after fall. EXAM: CT OF THE RIGHT HIP WITHOUT CONTRAST TECHNIQUE: Multidetector CT imaging of the right hip was performed according to the standard protocol. Multiplanar CT image reconstructions were also generated. RADIATION DOSE REDUCTION: This exam was performed according to the departmental dose-optimization program which includes automated exposure control, adjustment of the mA and/or kV according to patient size and/or use of iterative reconstruction technique. COMPARISON:  Right hip radiographs dated 03/02/2024. FINDINGS: Bones/Joint/Cartilage There is a nondisplaced mildly impacted fracture of the right femoral head and neck junction, predominantly laterally (series 6, images 65-74 and series 3, images 56 and 64). The remainder of the visualized bones are intact. No dislocation. Sacroiliac joints and pubic symphysis are intact. Muscles and Tendons No intramuscular fluid collection. Soft tissue No fluid collection or hematoma. Visualized intrapelvic contents are unremarkable. Atherosclerotic vascular calcification. IMPRESSION: Nondisplaced mildly impacted fracture of the right femoral head and neck junction, most predominant laterally. Electronically Signed   By: Hart Robinsons M.D.   On: 03/02/2024 13:17   DG Hip Unilat  With Pelvis 2-3 Views Right Result Date: 03/02/2024 CLINICAL DATA:  Patient fell last night.  Right hip pain. EXAM: DG HIP (WITH OR WITHOUT PELVIS) 2-3V RIGHT COMPARISON:  12/06/2022 FINDINGS: Bones are diffusely demineralized. SI joints and symphysis pubis unremarkable. No evidence for pubic ramus fracture. Status post left total hip replacement,  incompletely visualized. AP and frog-leg lateral views of the right hip show evidence of impaction at the junction of the femoral neck and femoral head laterally, new in the interval since prior study. No discrete fracture line is evident by x-ray. IMPRESSION: Evidence for impaction at the junction of the femoral neck and femoral head laterally, suspicious for fracture. No discrete fracture line is evident by x-ray. CT may be able to definitively characterize this although in the setting of incomplete fracture, MRI would be a more sensitive study with which to confirm. Electronically Signed   By: Kennith Center M.D.   On: 03/02/2024 11:07    Procedures Procedures    Medications Ordered in ED Medications - No data to display  ED Course/ Medical Decision Making/ A&P  Medical Decision Making Patient here for evaluation of right hip pain after mechanical fall that occurred last evening.  Initially unable to bear weight but states after having x-ray here pain has improved and she is able to ambulate with antalgic gait.  Extremity neurovascularly intact.  She denies back pain neck pain head injury or LOC.  She does not take blood thinners.  I suspect fracture.  Ligamentous injury contusion sprain also considered  Amount and/or Complexity of Data Reviewed Radiology: ordered.    Details: X-ray of the right hip shows impaction at the junction of the femoral neck and head CT MRI recommended  CT of the hip shows nondisplaced minimally impacted fracture of the right femoral head and neck junction Discussion of management or test interpretation with external provider(s):  Discussed case findings with orthopedics, Dr. Dallas Schimke who recommends hospitalist admission with plan for surgery tomorrow.  N.p.o. after midnight  Consulted with [facility] hospitalist, Dr. Adrian Blackwater who agrees to admit  Risk Decision regarding hospitalization.           Final Clinical Impression(s)  / ED Diagnoses Final diagnoses:  Closed fracture of right hip, initial encounter [facility])    Rx / DC Orders ED Discharge Orders     None         Rosey Bath 03/02/24 1806    Bethann Berkshire, MD 03/04/24 1135

## 2024-03-02 NOTE — Consult Note (Signed)
 ORTHOPAEDIC CONSULTATION  REQUESTING PHYSICIAN: Levie Heritage, DO  ASSESSMENT AND PLAN: 68 y.o. female with the following: Right Hip Non displaced femoral neck fracture  This patient requires inpatient admission to the hospitalist, to include preoperative clearance and perioperative medical management  - Weight Bearing Status/Activity: NWB Right lower extremity  - Additional recommended labs/tests: Preop Labs: CBC, BMP, PT/INR, Chest XR, and EKG  -VTE Prophylaxis: Please hold prior to OR; to resume POD#1 at the discretion of the primary team  - Pain control: Recommend PO pain medications PRN; judicious use of narcotics  - Follow-up plan: F/u 10-14 days postop  -Procedures: Plan for OR once patient has been medically optimized  Plan for Right Hip CRPP  Plan for OR 03/03/24, NPO at midnight   Chief Complaint: Right hip pain  HPI: [name] is a 68 y.o. female who presented to the ED for evaluation after sustaining a mechanical fall.  She tripped at home and fell on her right hip.  She was able to bear some weight, but needed assistance.  Fall was last night.  Pain persisted so she came to the ED.  She did not hit her head.  She is not on blood thinners.  No pain elsewhere.   She has a history of left total hip arthroplasty.   Past Medical History:  Diagnosis Date   Coronary artery disease    Endometriosis    Hyperlipidemia    Hypertension    Hypotension    Ruptured cervical disc    Skin cancer    Past Surgical History:  Procedure Laterality Date   ABDOMINAL HYSTERECTOMY     ANTERIOR APPROACH HEMI HIP ARTHROPLASTY Left 05/29/2016   Procedure: ANTERIOR APPROACH TOTAL HIP ARTHROPLASTY ;  Surgeon: Kathryne Hitch, MD;  Location: [facility] OR;  Service: Orthopedics;  Laterality: Left;   ARTERY BIOPSY Right 05/03/2019   Procedure: MINOR BIOPSY TEMPORAL ARTERY;  Surgeon: Lucretia Roers, MD;  Location: [facility] ORS;  Service: General;  Laterality: Right;   CARDIAC  CATHETERIZATION     CATARACT EXTRACTION W/PHACO Right 06/18/2022   Procedure: CATARACT EXTRACTION PHACO AND INTRAOCULAR LENS PLACEMENT (IOC);  Surgeon: Fabio Pierce, MD;  Location: [facility] ORS;  Service: Ophthalmology;  Laterality: Right;  CDE 115.90   CATARACT EXTRACTION W/PHACO Left 07/02/2022   Procedure: CATARACT EXTRACTION PHACO AND INTRAOCULAR LENS PLACEMENT (IOC);  Surgeon: Fabio Pierce, MD;  Location: [facility] ORS;  Service: Ophthalmology;  Laterality: Left;  CDE: 5.85   CHOLECYSTECTOMY     TONSILLECTOMY     Social History   Socioeconomic History   Marital status: Married    Spouse name: Not on file   Number of children: Not on file   Years of education: Not on file   Highest education level: Not on file  Occupational History   Not on file  Tobacco Use   Smoking status: Former    Current packs/day: 0.00    Types: Cigarettes    Quit date: 05/01/2019    Years since quitting: 4.8   Smokeless tobacco: Never  Vaping Use   Vaping status: Never Used  Substance and Sexual Activity   Alcohol use: Yes    Comment: occassional   Drug use: Not Currently   Sexual activity: Not on file  Other Topics Concern   Not on file  Social History Narrative   Not on file   Social Drivers of Health   Financial Resource Strain: Not on file  Food Insecurity: No Food Insecurity (03/02/2024)  Hunger Vital Sign    Worried About Running Out of Food in the Last Year: Never true    Ran Out of Food in the Last Year: Never true  Transportation Needs: No Transportation Needs (03/02/2024)   PRAPARE - Administrator, Civil Service (Medical): No    Lack of Transportation (Non-Medical): No  Physical Activity: Not on file  Stress: Not on file  Social Connections: Moderately Isolated (03/02/2024)   Social Connection and Isolation Panel [NHANES]    Frequency of Communication with Friends and Family: More than three times a week    Frequency of Social Gatherings with Friends and Family: More than three  times a week    Attends Religious Services: Never    Database administrator or Organizations: No    Attends Engineer, structural: Never    Marital Status: Married   Family History  Problem Relation Age of Onset   Cancer Mother    Cancer Father    Allergies  Allergen Reactions   Cortisone     REACTION: Fluid retention and hives   Prior to Admission medications   Medication Sig Start Date End Date Taking? Authorizing Provider  albuterol (PROVENTIL) (2.5 MG/3ML) 0.083% nebulizer solution Take 2.5 mg by nebulization 3 (three) times daily as needed for wheezing or shortness of breath. 01/04/24   [provider]  albuterol (VENTOLIN HFA) 108 (90 Base) MCG/ACT inhaler Inhale 1-2 puffs into the lungs every 6 (six) hours as needed for wheezing or shortness of breath. 02/11/24   [provider]  amLODipine (NORVASC) 5 MG tablet Take 1 tablet (5 mg total) by mouth daily. 01/09/20   Jacquelin Hawking, PA-C  aspirin 81 MG chewable tablet Chew 81 mg by mouth daily.    [provider]  atorvastatin (LIPITOR) 20 MG tablet Take 1 tablet (20 mg total) by mouth daily. 01/09/20   Jacquelin Hawking, PA-C  atorvastatin (LIPITOR) 40 MG tablet Take 40 mg by mouth daily. 01/04/24   [provider]  benzonatate (TESSALON) 100 MG capsule Take 100 mg by mouth 3 (three) times daily as needed. 02/14/24   [provider]  ezetimibe (ZETIA) 10 MG tablet Take 10 mg by mouth daily. 02/11/24   [provider]  hydrochlorothiazide (HYDRODIURIL) 12.5 MG tablet Take 12.5 mg by mouth daily. 01/04/24   [provider]  levothyroxine (SYNTHROID) 25 MCG tablet Take 25 mcg by mouth every morning. 12/30/23   [provider]  lisinopril (ZESTRIL) 20 MG tablet Take 1 tablet (20 mg total) by mouth daily. 01/09/20   Jacquelin Hawking, PA-C  metoprolol tartrate (LOPRESSOR) 100 MG tablet Take 1 tablet (100 mg total) by mouth 2 (two) times daily. 01/09/20   Jacquelin Hawking,  PA-C  montelukast (SINGULAIR) 10 MG tablet Take 10 mg by mouth daily. 01/24/24   [provider]   CT Hip Right Wo Contrast Result Date: 03/02/2024 CLINICAL DATA:  Right hip pain after fall. EXAM: CT OF THE RIGHT HIP WITHOUT CONTRAST TECHNIQUE: Multidetector CT imaging of the right hip was performed according to the standard protocol. Multiplanar CT image reconstructions were also generated. RADIATION DOSE REDUCTION: This exam was performed according to the departmental dose-optimization program which includes automated exposure control, adjustment of the mA and/or kV according to patient size and/or use of iterative reconstruction technique. COMPARISON:  Right hip radiographs dated 03/02/2024. FINDINGS: Bones/Joint/Cartilage There is a nondisplaced mildly impacted fracture of the right femoral head and neck junction, predominantly laterally (series 6,  images 65-74 and series 3, images 56 and 64). The remainder of the visualized bones are intact. No dislocation. Sacroiliac joints and pubic symphysis are intact. Muscles and Tendons No intramuscular fluid collection. Soft tissue No fluid collection or hematoma. Visualized intrapelvic contents are unremarkable. Atherosclerotic vascular calcification. IMPRESSION: Nondisplaced mildly impacted fracture of the right femoral head and neck junction, most predominant laterally. Electronically Signed   By: Hart Robinsons M.D.   On: 03/02/2024 13:17   DG Hip Unilat  With Pelvis 2-3 Views Right Result Date: 03/02/2024 CLINICAL DATA:  Patient fell last night.  Right hip pain. EXAM: DG HIP (WITH OR WITHOUT PELVIS) 2-3V RIGHT COMPARISON:  12/06/2022 FINDINGS: Bones are diffusely demineralized. SI joints and symphysis pubis unremarkable. No evidence for pubic ramus fracture. Status post left total hip replacement, incompletely visualized. AP and frog-leg lateral views of the right hip show evidence of impaction at the junction of the femoral neck and femoral head  laterally, new in the interval since prior study. No discrete fracture line is evident by x-ray. IMPRESSION: Evidence for impaction at the junction of the femoral neck and femoral head laterally, suspicious for fracture. No discrete fracture line is evident by x-ray. CT may be able to definitively characterize this although in the setting of incomplete fracture, MRI would be a more sensitive study with which to confirm. Electronically Signed   By: Kennith Center M.D.   On: 03/02/2024 11:07   Family History Reviewed and non-contributory, no pertinent history of problems with bleeding or anesthesia    Review of Systems No fevers or chills No numbness or tingling No chest pain No shortness of breath No bowel or bladder dysfunction No GI distress No headaches    OBJECTIVE  Vitals:Patient Vitals for the past 8 hrs:  BP Temp Temp src Pulse Resp SpO2 Height Weight  03/02/24 2030 134/69 98.6 F (37 C) Oral 70 16 100 % -- --  03/02/24 1630 (!) 157/71 98 F (36.7 C) Oral 73 19 99 % 5\' 5"  (1.651 m) 75.3 kg  03/02/24 1600 -- -- -- 74 15 95 % -- --  03/02/24 1522 (!) 146/73 98.4 F (36.9 C) Oral 71 17 97 % -- --   General: Alert, no acute distress Cardiovascular: Extremities are warm Respiratory: No cyanosis, no use of accessory musculature Skin: No lesions in the area of chief complaint  Neurologic: Sensation intact distally  Psychiatric: Patient is competent for consent with normal mood and affect Lymphatic: No swelling obvious and reported other than the area involved in the exam below Extremities  RLE: Extremity held in a fixed position.  ROM deferred due to known fracture.  Sensation is intact distally in the sural, saphenous, DP, SP, and plantar nerve distribution. 2+ DP pulse.  Toes are WWP.  Active motion intact in the TA/EHL/GS. LLE: Sensation is intact distally in the sural, saphenous, DP, SP, and plantar nerve distribution. 2+ DP pulse.  Toes are WWP.  Active motion intact in the  TA/EHL/GS. Tolerates gentle ROM of the hip.  No pain with axial loading.     Test Results Imaging XR and CT scan of the Right hip demonstrates a Non displaced femoral neck fracture.  Labs cbc Recent Labs    03/02/24 1609  WBC 7.7  HGB 12.2  HCT 34.8*  PLT 295      Recent Labs    03/02/24 1609  NA 132*  K 3.2*  CL 97*  CO2 21*  GLUCOSE 117*  BUN 10  CREATININE 0.81  CALCIUM 9.8

## 2024-03-03 ENCOUNTER — Encounter (HOSPITAL_COMMUNITY)
Admission: EM | Disposition: A | Discharge status: Home Health | Payer: Self-pay | Source: Home / Self Care | Attending: Internal Medicine

## 2024-03-03 ENCOUNTER — Inpatient Hospital Stay (HOSPITAL_COMMUNITY): Admitting: Anesthesiology

## 2024-03-03 ENCOUNTER — Inpatient Hospital Stay (HOSPITAL_COMMUNITY)

## 2024-03-03 DIAGNOSIS — W19XXXA Unspecified fall, initial encounter: Secondary | ICD-10-CM

## 2024-03-03 DIAGNOSIS — S72001A Fracture of unspecified part of neck of right femur, initial encounter for closed fracture: Secondary | ICD-10-CM | POA: Diagnosis not present

## 2024-03-03 HISTORY — PX: HIP PINNING,CANNULATED: SHX1758

## 2024-03-03 LAB — GLUCOSE, CAPILLARY
Glucose-Capillary: 101 mg/dL — ABNORMAL HIGH (ref 70–99)
Glucose-Capillary: 130 mg/dL — ABNORMAL HIGH (ref 70–99)

## 2024-03-03 LAB — HIV ANTIBODY (ROUTINE TESTING W REFLEX): HIV Screen 4th Generation wRfx: NONREACTIVE

## 2024-03-03 LAB — SURGICAL PCR SCREEN
MRSA, PCR: NEGATIVE
Staphylococcus aureus: NEGATIVE

## 2024-03-03 SURGERY — FIXATION, FEMUR, NECK, PERCUTANEOUS, USING SCREW
Anesthesia: General | Site: Hip | Laterality: Right

## 2024-03-03 MED ORDER — LIDOCAINE HCL (CARDIAC) PF 100 MG/5ML IV SOSY
PREFILLED_SYRINGE | INTRAVENOUS | Status: DC | PRN
  Administered 2024-03-03: 100 mg via INTRAVENOUS

## 2024-03-03 MED ORDER — PHENYLEPHRINE HCL (PRESSORS) 10 MG/ML IV SOLN
INTRAVENOUS | Status: DC | PRN
  Administered 2024-03-03 (×4): 80 ug via INTRAVENOUS

## 2024-03-03 MED ORDER — ROCURONIUM BROMIDE 10 MG/ML (PF) SYRINGE
PREFILLED_SYRINGE | INTRAVENOUS | Status: AC
  Filled 2024-03-03: qty 10

## 2024-03-03 MED ORDER — DEXAMETHASONE SODIUM PHOSPHATE 10 MG/ML IJ SOLN
INTRAMUSCULAR | Status: DC | PRN
Start: 2024-03-03 — End: 2024-03-03
  Administered 2024-03-03: 10 mg via INTRAVENOUS

## 2024-03-03 MED ORDER — FENTANYL CITRATE (PF) 100 MCG/2ML IJ SOLN
INTRAMUSCULAR | Status: AC
  Filled 2024-03-03: qty 2

## 2024-03-03 MED ORDER — MIDAZOLAM HCL 2 MG/2ML IJ SOLN
INTRAMUSCULAR | Status: AC
  Filled 2024-03-03: qty 2

## 2024-03-03 MED ORDER — VANCOMYCIN HCL 1000 MG IV SOLR
INTRAVENOUS | Status: AC
  Filled 2024-03-03: qty 20

## 2024-03-03 MED ORDER — HYDROMORPHONE HCL 1 MG/ML IJ SOLN: 0.2500 mg | INTRAMUSCULAR | Status: DC | PRN

## 2024-03-03 MED ORDER — ROCURONIUM BROMIDE 10 MG/ML (PF) SYRINGE
PREFILLED_SYRINGE | INTRAVENOUS | Status: DC | PRN
  Administered 2024-03-03: 50 mg via INTRAVENOUS

## 2024-03-03 MED ORDER — SODIUM CHLORIDE 0.9 % IV SOLN: 12.5000 mg | INTRAVENOUS | Status: DC | PRN

## 2024-03-03 MED ORDER — PROPOFOL 10 MG/ML IV BOLUS
INTRAVENOUS | Status: DC | PRN
  Administered 2024-03-03: 120 mg via INTRAVENOUS

## 2024-03-03 MED ORDER — 0.9 % SODIUM CHLORIDE (POUR BTL) OPTIME
TOPICAL | Status: DC | PRN
  Administered 2024-03-03: 1000 mL

## 2024-03-03 MED ORDER — DEXAMETHASONE SODIUM PHOSPHATE 10 MG/ML IJ SOLN
INTRAMUSCULAR | Status: AC
  Filled 2024-03-03: qty 1

## 2024-03-03 MED ORDER — MIDAZOLAM HCL 5 MG/5ML IJ SOLN
INTRAMUSCULAR | Status: DC | PRN
Start: 2024-03-03 — End: 2024-03-03
  Administered 2024-03-03: 2 mg via INTRAVENOUS

## 2024-03-03 MED ORDER — CEFAZOLIN SODIUM-DEXTROSE 2-4 GM/100ML-% IV SOLN
2.0000 g | Freq: Three times a day (TID) | INTRAVENOUS | Status: DC
  Administered 2024-03-03 – 2024-03-04 (×2): 2 g via INTRAVENOUS
  Filled 2024-03-03 (×2): qty 100

## 2024-03-03 MED ORDER — ONDANSETRON HCL 4 MG/2ML IJ SOLN
INTRAMUSCULAR | Status: AC
  Filled 2024-03-03: qty 2

## 2024-03-03 MED ORDER — FENTANYL CITRATE (PF) 100 MCG/2ML IJ SOLN
INTRAMUSCULAR | Status: DC | PRN
  Administered 2024-03-03 (×3): 50 ug via INTRAVENOUS

## 2024-03-03 MED ORDER — OXYCODONE HCL 5 MG/5ML PO SOLN: 5.0000 mg | Freq: Once | ORAL | Status: DC | PRN

## 2024-03-03 MED ORDER — OXYCODONE HCL 5 MG PO TABS: 5.0000 mg | ORAL_TABLET | Freq: Once | ORAL | Status: DC | PRN

## 2024-03-03 MED ORDER — BUPIVACAINE HCL (PF) 0.5 % IJ SOLN
INTRAMUSCULAR | Status: DC | PRN
  Administered 2024-03-03: 30 mL

## 2024-03-03 MED ORDER — LACTATED RINGERS IV SOLN
INTRAVENOUS | Status: DC
Start: 2024-03-03 — End: 2024-03-03

## 2024-03-03 MED ORDER — SUGAMMADEX SODIUM 200 MG/2ML IV SOLN
INTRAVENOUS | Status: DC | PRN
  Administered 2024-03-03: 200 mg via INTRAVENOUS

## 2024-03-03 MED ORDER — PROPOFOL 10 MG/ML IV BOLUS
INTRAVENOUS | Status: AC
  Filled 2024-03-03: qty 20

## 2024-03-03 MED ORDER — MUPIROCIN 2 % EX OINT: 1.0000 | TOPICAL_OINTMENT | Freq: Two times a day (BID) | CUTANEOUS | Status: DC

## 2024-03-03 MED ORDER — PHENYLEPHRINE 80 MCG/ML (10ML) SYRINGE FOR IV PUSH (FOR BLOOD PRESSURE SUPPORT)
PREFILLED_SYRINGE | INTRAVENOUS | Status: AC
  Filled 2024-03-03: qty 10

## 2024-03-03 MED ORDER — KETOROLAC TROMETHAMINE 30 MG/ML IJ SOLN
INTRAMUSCULAR | Status: AC
  Filled 2024-03-03: qty 1

## 2024-03-03 SURGICAL SUPPLY — 49 items
BAG HAMPER (MISCELLANEOUS) ×2 IMPLANT
BIT DRILL 4.8X300 (BIT) IMPLANT
BIT DRILL CANN 5.0 (BIT) IMPLANT
BLADE SURG SZ10 CARB STEEL (BLADE) ×4 IMPLANT
BNDG GAUZE ELAST 4 BULKY (GAUZE/BANDAGES/DRESSINGS) ×2 IMPLANT
CHLORAPREP W/TINT 26 (MISCELLANEOUS) ×2 IMPLANT
CLOTH BEACON ORANGE TIMEOUT ST (SAFETY) ×2 IMPLANT
CLSR STERI-STRIP ANTIMIC 1/2X4 (GAUZE/BANDAGES/DRESSINGS) ×2 IMPLANT
COUNTER NDL MAGNETIC 40 RED (SET/KITS/TRAYS/PACK) ×2 IMPLANT
COUNTER NEEDLE MAGNETIC 40 RED (SET/KITS/TRAYS/PACK) ×1 IMPLANT
COVER LIGHT HANDLE STERIS (MISCELLANEOUS) ×4 IMPLANT
COVER PERINEAL POST (MISCELLANEOUS) ×2 IMPLANT
DECANTER SPIKE VIAL GLASS SM (MISCELLANEOUS) ×4 IMPLANT
DRAPE STERI IOBAN 125X83 (DRAPES) ×2 IMPLANT
DRSG MEPILEX SACRM 8.7X9.8 (GAUZE/BANDAGES/DRESSINGS) ×2 IMPLANT
DRSG TEGADERM 4X10 (GAUZE/BANDAGES/DRESSINGS) IMPLANT
DRSG TEGADERM 4X4.75 (GAUZE/BANDAGES/DRESSINGS) ×4 IMPLANT
GAUZE SPONGE 4X4 12PLY STRL (GAUZE/BANDAGES/DRESSINGS) ×2 IMPLANT
GLOVE BIO SURGEON STRL SZ 6.5 (GLOVE) IMPLANT
GLOVE BIO SURGEON STRL SZ8 (GLOVE) ×4 IMPLANT
GLOVE BIOGEL PI IND STRL 6.5 (GLOVE) IMPLANT
GLOVE BIOGEL PI IND STRL 7.0 (GLOVE) ×4 IMPLANT
GLOVE BIOGEL PI IND STRL 8 (GLOVE) ×2 IMPLANT
GOWN STRL REUS W/TWL LRG LVL3 (GOWN DISPOSABLE) ×2 IMPLANT
GOWN STRL REUS W/TWL XL LVL3 (GOWN DISPOSABLE) ×6 IMPLANT
INST SET MAJOR BONE (KITS) ×2 IMPLANT
KIT TURNOVER CYSTO (KITS) ×2 IMPLANT
MANIFOLD NEPTUNE II (INSTRUMENTS) ×2 IMPLANT
MARKER SKIN DUAL TIP RULER LAB (MISCELLANEOUS) ×2 IMPLANT
NDL HYPO 21X1.5 SAFETY (NEEDLE) ×2 IMPLANT
NEEDLE HYPO 21X1.5 SAFETY (NEEDLE) ×1 IMPLANT
NS IRRIG 1000ML POUR BTL (IV SOLUTION) ×2 IMPLANT
PACK SRG BSC III STRL LF ECLPS (CUSTOM PROCEDURE TRAY) ×2 IMPLANT
PAD ABD 5X9 TENDERSORB (GAUZE/BANDAGES/DRESSINGS) ×2 IMPLANT
PENCIL SMOKE EVACUATOR COATED (MISCELLANEOUS) ×2 IMPLANT
PIN GUIDE THRD AR 3.2X330 (PIN) IMPLANT
POSITIONER HEAD 8X9X4 ADT (SOFTGOODS) ×2 IMPLANT
SCREW CANN 7.0 X 100 (Screw) IMPLANT
SCREW CANN ST 7X90 (Screw) IMPLANT
SET BASIN LINEN APH (SET/KITS/TRAYS/PACK) ×2 IMPLANT
SPONGE T-LAP 18X18 ~~LOC~~+RFID (SPONGE) ×4 IMPLANT
STRIP CLOSURE SKIN 1/2X4 (GAUZE/BANDAGES/DRESSINGS) IMPLANT
SUT MNCRL AB 4-0 PS2 18 (SUTURE) ×2 IMPLANT
SUT MON AB 2-0 CT1 36 (SUTURE) ×2 IMPLANT
SUT VIC AB 0 CT1 27XBRD ANTBC (SUTURE) ×2 IMPLANT
SYR 30ML LL (SYRINGE) ×2 IMPLANT
SYR BULB IRRIG 60ML STRL (SYRINGE) ×4 IMPLANT
WASHER FLAT 7.0 (Washer) IMPLANT
YANKAUER SUCT BULB TIP 10FT TU (MISCELLANEOUS) ×2 IMPLANT

## 2024-03-03 NOTE — Addendum Note (Signed)
 Addendum  created 03/03/24 1705 by Ronelle Nigh, MD   Clinical Note Signed, Intraprocedure Meds edited

## 2024-03-03 NOTE — Anesthesia Postprocedure Evaluation (Signed)
 Anesthesia Post Note  Patient: [name]  Procedure(s) Performed: FIXATION, FEMUR, NECK, PERCUTANEOUS, USING SCREW (Right: Hip)  Patient location during evaluation: PACU Anesthesia Type: General Level of consciousness: awake and alert Pain management: pain level controlled Vital Signs Assessment: post-procedure vital signs reviewed and stable Respiratory status: spontaneous breathing, nonlabored ventilation, respiratory function stable and patient connected to nasal cannula oxygen Cardiovascular status: blood pressure returned to baseline and stable Postop Assessment: no apparent nausea or vomiting Anesthetic complications: no   There were no known notable events for this encounter.   Last Vitals:  Vitals:   03/03/24 1515 03/03/24 1613  BP: (!) 143/49 (!) 125/56  Pulse: 70 68  Resp: 18   Temp: 36.6 C 36.6 C  SpO2: 100% 95%    Last Pain:  Vitals:   03/03/24 1613  TempSrc: Oral  PainSc:                  Gaetano Hawthorne

## 2024-03-03 NOTE — Progress Notes (Signed)
   03/03/24 1622  TOC Brief Assessment  Insurance and Status Reviewed  Patient has primary care physician Yes  Home environment has been reviewed Single Family Home.  Prior level of function: Independent.  Prior/Current Home Services No current home services  Readmission risk has been reviewed Yes  Transition of care needs transition of care needs identified, TOC will continue to follow   Transition of Care Department [facility]) has reviewed patient and no TOC needs have been identified at this time. We will continue to monitor patient advancement through interdisciplinary progression rounds. If new patient transition needs arise, please place a TOC consult.

## 2024-03-03 NOTE — Progress Notes (Signed)
 PROGRESS NOTE    [name]  ZOX:[id number] DOB: 03/07/1956 DOA: 03/02/2024 PCP: Kara Pacer, NP   Brief Narrative:   [name] is a 68 y.o. female with medical history significant of CAD, HTN, hyperlipidemia. Patient had mechanical fall last night, landed on right hip. Had some pain, but immediately got up and was walking on it. This morning, had difficulty getting up and having increasing pain.  Patient was admitted for evaluation of closed right hip fracture and orthopedics plans for operative repair today.  Assessment & Plan:   Principal Problem:   Closed right hip fracture (HCC) Active Problems:   Coronary atherosclerosis   Asthma   Hypertension   Tobacco abuse, in remission  Assessment and Plan:   Closed right hip fracture Pain controlled N.p.o. after midnight Check CBC and CMP Check EKG Plan is for surgery tomorrow No pharmacological prophylaxis for DVT, will start mechanical prophylaxis HTN Continue antihypertensives CAD H/o tobacco abuse    DVT prophylaxis: SCDs Code Status: Full Family Communication: None at bedside Disposition Plan:  Status is: Inpatient Remains inpatient appropriate because: Need for inpatient procedure and IV medications.   Consultants:  Orthopedics  Procedures:  None  Antimicrobials:  Anti-infectives (From admission, onward)    Start     Dose/Rate Route Frequency Ordered Stop   03/03/24 1300  ceFAZolin (ANCEF) IVPB 2g/100 mL premix        2 g 200 mL/hr over 30 Minutes Intravenous On call to O.R. 03/02/24 2150 03/04/24 0559      Subjective: Patient seen and evaluated today with no new acute complaints or concerns. No acute concerns or events noted overnight.  Objective: Vitals:   03/02/24 1630 03/02/24 2030 03/03/24 0034 03/03/24 0537  BP: (!) 157/71 134/69 (!) 120/50 132/70  Pulse: 73 70 65 74  Resp: 19 16 16 16   Temp: 98 F (36.7 C) 98.6 F (37 C) 98.5 F (36.9 C) 97.9 F (36.6 C)  TempSrc:  Oral Oral Oral Oral  SpO2: 99% 100% 98% 99%  Weight: 75.3 kg     Height: 5\' 5"  (1.651 m)       Intake/Output Summary (Last 24 hours) at 03/03/2024 0716 Last data filed at 03/03/2024 0500 Gross per 24 hour  Intake 480 ml  Output 800 ml  Net -320 ml   Filed Weights   03/02/24 1005 03/02/24 1630  Weight: 73.9 kg 75.3 kg    Examination:  General exam: Appears calm and comfortable  Respiratory system: Clear to auscultation. Respiratory effort normal. Cardiovascular system: S1 & S2 heard, RRR.  Gastrointestinal system: Abdomen is soft Central nervous system: Alert and awake Extremities: No edema Skin: No significant lesions noted Psychiatry: Flat affect.    Data Reviewed: I have personally reviewed following labs and imaging studies  CBC: Recent Labs  Lab 03/02/24 1609  WBC 7.7  HGB 12.2  HCT 34.8*  MCV 95.3  PLT 295   Basic Metabolic Panel: Recent Labs  Lab 03/02/24 1609  NA 132*  K 3.2*  CL 97*  CO2 21*  GLUCOSE 117*  BUN 10  CREATININE 0.81  CALCIUM 9.8   GFR: Estimated Creatinine Clearance: 67.5 mL/min (by C-G formula based on SCr of 0.81 mg/dL). Liver Function Tests: Recent Labs  Lab 03/02/24 1609  AST 23  ALT 20  ALKPHOS 101  BILITOT 0.9  PROT 7.5  ALBUMIN 3.9   No results for input(s): "LIPASE", "AMYLASE" in the last 168 hours. No results for input(s): "AMMONIA" in the  last 168 hours. Coagulation Profile: No results for input(s): "INR", "PROTIME" in the last 168 hours. Cardiac Enzymes: No results for input(s): "CKTOTAL", "CKMB", "CKMBINDEX", "TROPONINI" in the last 168 hours. BNP (last 3 results) No results for input(s): "PROBNP" in the last 8760 hours. HbA1C: No results for input(s): "HGBA1C" in the last 72 hours. CBG: No results for input(s): "GLUCAP" in the last 168 hours. Lipid Profile: No results for input(s): "CHOL", "HDL", "LDLCALC", "TRIG", "CHOLHDL", "LDLDIRECT" in the last 72 hours. Thyroid Function Tests: No results for  input(s): "TSH", "T4TOTAL", "FREET4", "T3FREE", "THYROIDAB" in the last 72 hours. Anemia Panel: No results for input(s): "VITAMINB12", "FOLATE", "FERRITIN", "TIBC", "IRON", "RETICCTPCT" in the last 72 hours. Sepsis Labs: No results for input(s): "PROCALCITON", "LATICACIDVEN" in the last 168 hours.  No results found for this or any previous visit (from the past 240 hours).       Radiology Studies: CT Hip Right Wo Contrast Result Date: 03/02/2024 CLINICAL DATA:  Right hip pain after fall. EXAM: CT OF THE RIGHT HIP WITHOUT CONTRAST TECHNIQUE: Multidetector CT imaging of the right hip was performed according to the standard protocol. Multiplanar CT image reconstructions were also generated. RADIATION DOSE REDUCTION: This exam was performed according to the departmental dose-optimization program which includes automated exposure control, adjustment of the mA and/or kV according to patient size and/or use of iterative reconstruction technique. COMPARISON:  Right hip radiographs dated 03/02/2024. FINDINGS: Bones/Joint/Cartilage There is a nondisplaced mildly impacted fracture of the right femoral head and neck junction, predominantly laterally (series 6, images 65-74 and series 3, images 56 and 64). The remainder of the visualized bones are intact. No dislocation. Sacroiliac joints and pubic symphysis are intact. Muscles and Tendons No intramuscular fluid collection. Soft tissue No fluid collection or hematoma. Visualized intrapelvic contents are unremarkable. Atherosclerotic vascular calcification. IMPRESSION: Nondisplaced mildly impacted fracture of the right femoral head and neck junction, most predominant laterally. Electronically Signed   By: Hart Robinsons M.D.   On: 03/02/2024 13:17   DG Hip Unilat  With Pelvis 2-3 Views Right Result Date: 03/02/2024 CLINICAL DATA:  Patient fell last night.  Right hip pain. EXAM: DG HIP (WITH OR WITHOUT PELVIS) 2-3V RIGHT COMPARISON:  12/06/2022 FINDINGS: Bones are  diffusely demineralized. SI joints and symphysis pubis unremarkable. No evidence for pubic ramus fracture. Status post left total hip replacement, incompletely visualized. AP and frog-leg lateral views of the right hip show evidence of impaction at the junction of the femoral neck and femoral head laterally, new in the interval since prior study. No discrete fracture line is evident by x-ray. IMPRESSION: Evidence for impaction at the junction of the femoral neck and femoral head laterally, suspicious for fracture. No discrete fracture line is evident by x-ray. CT may be able to definitively characterize this although in the setting of incomplete fracture, MRI would be a more sensitive study with which to confirm. Electronically Signed   By: Kennith Center M.D.   On: 03/02/2024 11:07        Scheduled Meds:  amLODipine  5 mg Oral Daily   atorvastatin  20 mg Oral Daily   fluticasone  1 spray Each Nare Daily   lisinopril  20 mg Oral Daily   montelukast  10 mg Oral Daily   Continuous Infusions:   ceFAZolin (ANCEF) IV       LOS: 1 day    Time spent: 55 minutes    Khaalid Lefkowitz Hoover Brunette, DO [facility]  If 7PM-7AM, please contact night-coverage [email or web address] 03/03/2024,  7:16 AM

## 2024-03-03 NOTE — Op Note (Signed)
 Orthopaedic Surgery Operative Note (CSN: [id number])  [name]  May 26, 1956 Date of Surgery: 03/03/2024   Diagnoses:  Right femoral neck fracture  Procedure: CRPP of the Right femoral neck fracture   Operative Finding Successful completion of the planned procedure.  Placement of 3 x 7.0 mm cannulated screws with washers to stabilize the Non displaced femoral neck fracture   Post-Op Diagnosis: Same Surgeons:Primary: Oliver Barre, MD Assistants:  None Location: [facility] OR ROOM 4 Anesthesia: General with local anesthesia Antibiotics: Ancef 2 g Tourniquet time: N/A Estimated Blood Loss: 50 cc Complications: None Specimens: None  Implants: 3 x 7.0 mm partially threaded cannulated screws Washers x 3  Screw length: 100, 90, 90 mm  Indications for Surgery:   [name] is a 68 y.o. female who fell and sustained a non displaced right femoral neck fracture.  In order to restore function, I recommended surgery in the form of percutaneous pinning.  Benefits and risks of operative and nonoperative management were discussed prior to surgery with the patient and informed consent form was completed.  Specific risks including infection, need for additional surgery, non union, malunion, AVN, blood clots, bleeding and more severe complications associated with anesthesia were discussed.  She elected to proceed.  Surgical consent was finalized.    Procedure:   The patient was identified properly. Informed consent was obtained and the surgical site was marked. The patient was taken to the OR where general anesthesia was induced.  The patient was positioned supine on a Hana table.  The right leg was prepped and draped in the usual sterile fashion.  Timeout was performed before the beginning of the case.  She received 2 g Ancef prior to making incision  We started by making a lateral incision over the hip, in line with the starting point for the screws.  We incised sharply through the IT band to expose  the lateral border of the femur.  Under fluoroscopic guidance, we placed the first guide wire along the inferior aspect of the femoral neck.  We ensured that the starting point was superior to the lesser trochanter so as to reduce the risk of a stress riser.  Orthogonal images confirmed the placement of the first guidewire to be inferior and posterior within the femoral neck.  We then placed the targeting device and placed 2 additional guidewires in the superior and anterior and then superior and posterior femoral neck. We used fluoroscopy we were satisfied with the positioning of the guidewires.  Using the measuring device, we determined the length of screw needed.  We then proceeded to drill the lateral cortex.  All 3 screws with a washer were then inserted under fluoroscopic guidance.  Orthogonal imaging confirmed the location of the screws of sufficient length and the washers were flush with the lateral cortex.   We irrigated the wound copiously and then closed the incision in a multilayer fashion with absorbable suture.  Sterile dressing was placed.  Patient was awoken taken to PACU in stable condition.   Post-operative plan:  The patient will be returned to the floor.   Weightbearing status:  WBAT RLE Reinforce dressing as needed DVT prophylaxis per primary team, no orthopedic contraindications.    Pain control with PRN pain medication preferring oral medicines.   Follow up plan will be scheduled in approximately 10-14 days days for incision check and XR of the Right hip.

## 2024-03-03 NOTE — Progress Notes (Signed)
   ORTHOPAEDIC PROGRESS NOTE  Scheduled for Right Hip CRPP  DOS: 03/03/2024  SUBJECTIVE: No issues over night.  She slept well.  Nothing to eat or drink since midnight  OBJECTIVE: PE:  Alert and oriented, no acute distress  Sensation intact to the right foot Active motion EHL/TA 2+ DP pulse  Vitals:   03/03/24 0034 03/03/24 0537  BP: (!) 120/50 132/70  Pulse: 65 74  Resp: 16 16  Temp: 98.5 F (36.9 C) 97.9 F (36.6 C)  SpO2: 98% 99%     ASSESSMENT: [name] is a 68 y.o. female doing well.  Ready for OR.  NPO since midnight.   PLAN: Weightbearing: NWB RLE Incisional and dressing care: Reinforce dressings as needed; none currently Orthopedic device(s): None VTE prophylaxis: Recommend Aspirin 81mg  BID; to begin POD#1 Pain control: PO pain medications as needed; judicious use of narcotics Follow - up plan: 2 weeks   Contact information:     Dalicia Kisner A. Dallas Schimke, MD MS [facility] [address] [place],  [place]  [address] Phone: [phone] Fax: [phone]

## 2024-03-03 NOTE — Transfer of Care (Signed)
 Immediate Anesthesia Transfer of Care Note  Patient: [name]  Procedure(s) Performed: FIXATION, FEMUR, NECK, PERCUTANEOUS, USING SCREW (Right: Hip)  Patient Location: PACU  Anesthesia Type:General  Level of Consciousness: awake  Airway & Oxygen Therapy: Patient connected to nasal cannula oxygen  Post-op Assessment: Post -op Vital signs reviewed and stable  Post vital signs: stable  Last Vitals:  Vitals Value Taken Time  BP 109/73 03/03/24 1457  Temp    Pulse 71 03/03/24 1459  Resp 17 03/03/24 1459  SpO2 97 % 03/03/24 1459  Vitals shown include unfiled device data.  Last Pain:  Vitals:   03/03/24 0800  TempSrc:   PainSc: 0-No pain         Complications: There were no known notable events for this encounter.

## 2024-03-03 NOTE — Plan of Care (Signed)

## 2024-03-03 NOTE — Anesthesia Preprocedure Evaluation (Addendum)
 Anesthesia Evaluation  Patient identified by MRN, date of birth, ID band Patient awake    Reviewed: Allergy & Precautions, H&P , NPO status , Patient's Chart, lab work & pertinent test results, reviewed documented beta blocker date and time   Airway Mallampati: I  TM Distance: >3 FB Neck ROM: full    Dental  (+) Edentulous Upper, Edentulous Lower   Pulmonary asthma , pneumonia, former smoker Chronic bronchitis.  Quit smoking over a year ago.  Breathes pretty well now.   Pulmonary exam normal breath sounds clear to auscultation       Cardiovascular Exercise Tolerance: Poor hypertension, + CAD  Normal cardiovascular exam Rhythm:regular Rate:Normal  Remote 2 vessel nonobstructive CAD but has had no symptoms since.   Neuro/Psych  Headaches History ruptured cervical disc  negative psych ROS   GI/Hepatic negative GI ROS, Neg liver ROS,GERD  ,,  Endo/Other  negative endocrine ROS    Renal/GU negative Renal ROS  negative genitourinary   Musculoskeletal   Abdominal   Peds  Hematology negative hematology ROS (+)   Anesthesia Other Findings   Reproductive/Obstetrics negative OB ROS                              Anesthesia Physical Anesthesia Plan  ASA: 3 and emergent  Anesthesia Plan: General   Post-op Pain Management: Dilaudid IV   Induction: Intravenous  PONV Risk Score and Plan: Ondansetron, Dexamethasone and Midazolam  Airway Management Planned: Oral ETT  Additional Equipment: None  Intra-op Plan:   Post-operative Plan: Extubation in OR  Informed Consent: I have reviewed the patients History and Physical, chart, labs and discussed the procedure including the risks, benefits and alternatives for the proposed anesthesia with the patient or authorized representative who has indicated his/her understanding and acceptance.     Dental Advisory Given  Plan Discussed with:  CRNA  Anesthesia Plan Comments:        Anesthesia Quick Evaluation

## 2024-03-03 NOTE — Plan of Care (Signed)
  Problem: Education: Goal: Knowledge of General Education information will improve Description: Including pain rating scale, medication(s)/side effects and non-pharmacologic comfort measures Outcome: Progressing   Problem: Clinical Measurements: Goal: Ability to maintain clinical measurements within normal limits will improve Outcome: Progressing   Problem: Coping: Goal: Level of anxiety will decrease Outcome: Progressing   Problem: Skin Integrity: Goal: Risk for impaired skin integrity will decrease Outcome: Progressing

## 2024-03-04 DIAGNOSIS — S72001A Fracture of unspecified part of neck of right femur, initial encounter for closed fracture: Secondary | ICD-10-CM | POA: Diagnosis not present

## 2024-03-04 LAB — TSH: TSH: 0.653 u[IU]/mL (ref 0.350–4.500)

## 2024-03-04 LAB — BASIC METABOLIC PANEL WITH GFR
Anion gap: 12 (ref 5–15)
BUN: 10 mg/dL (ref 8–23)
CO2: 21 mmol/L — ABNORMAL LOW (ref 22–32)
Calcium: 8.7 mg/dL — ABNORMAL LOW (ref 8.9–10.3)
Chloride: 96 mmol/L — ABNORMAL LOW (ref 98–111)
Creatinine, Ser: 0.94 mg/dL (ref 0.44–1.00)
GFR, Estimated: >60 mL/min (ref >60)
Glucose, Bld: 145 mg/dL — ABNORMAL HIGH (ref 70–99)
Potassium: 3.8 mmol/L (ref 3.5–5.1)
Sodium: 129 mmol/L — ABNORMAL LOW (ref 135–145)

## 2024-03-04 LAB — CBC
HCT: 31.9 % — ABNORMAL LOW (ref 36.0–46.0)
Hemoglobin: 11 g/dL — ABNORMAL LOW (ref 12.0–15.0)
MCH: 33.5 pg (ref 26.0–34.0)
MCHC: 34.5 g/dL (ref 30.0–36.0)
MCV: 97.3 fL (ref 80.0–100.0)
Platelets: 258 10*3/uL (ref 150–400)
RBC: 3.28 MIL/uL — ABNORMAL LOW (ref 3.87–5.11)
RDW: 12.4 % (ref 11.5–15.5)
WBC: 9.8 10*3/uL (ref 4.0–10.5)
nRBC: 0 % (ref 0.0–0.2)

## 2024-03-04 LAB — OSMOLALITY, URINE: Osmolality, Ur: 212 mosm/kg — ABNORMAL LOW (ref 300–900)

## 2024-03-04 LAB — SODIUM, URINE, RANDOM: Sodium, Ur: <10 mmol/L

## 2024-03-04 LAB — MAGNESIUM: Magnesium: 1.8 mg/dL (ref 1.7–2.4)

## 2024-03-04 LAB — OSMOLALITY: Osmolality: 272 mosm/kg — ABNORMAL LOW (ref 275–295)

## 2024-03-04 MED ORDER — ATORVASTATIN CALCIUM 20 MG PO TABS: 20.0000 mg | ORAL_TABLET | Freq: Every day | ORAL | 1 refills | Status: DC

## 2024-03-04 MED ORDER — OXYCODONE HCL 5 MG PO TABS: 5.0000 mg | ORAL_TABLET | Freq: Three times a day (TID) | ORAL | 0 refills | Status: DC | PRN

## 2024-03-04 MED ORDER — ASPIRIN 81 MG PO CHEW: 81.0000 mg | CHEWABLE_TABLET | Freq: Two times a day (BID) | ORAL | 0 refills | Status: DC

## 2024-03-04 NOTE — Plan of Care (Signed)
   Problem: Education: Goal: Knowledge of General Education information will improve Description Including pain rating scale, medication(s)/side effects and non-pharmacologic comfort measures Outcome: Progressing   Problem: Health Behavior/Discharge Planning: Goal: Ability to manage health-related needs will improve Outcome: Progressing

## 2024-03-04 NOTE — Discharge Summary (Signed)
 Physician Discharge Summary  [name] ZOX:[id number] DOB: Sep 04, 1956 DOA: 03/02/2024  PCP: Kara Pacer, NP  Admit date: 03/02/2024  Discharge date: 03/04/2024  Admitted From:Home  Disposition:  Home  Recommendations for Outpatient Follow-up:  Follow up with PCP in 1-2 weeks Follow-up with orthopedics in 2-3 weeks as recommended Remain on aspirin twice daily as recommended for DVT prophylaxis Oxycodone 5 mg 20 tablets prescribed and 0 refills as needed for severe pain management Continue other home medications as prior  Home Health: Yes with PT/OT  Equipment/Devices: None  Discharge Condition:Stable  CODE STATUS: Full  Diet recommendation: Heart Healthy  Brief/Interim Summary: [name] is a 68 y.o. female with medical history significant of CAD, HTN, hyperlipidemia. Patient had mechanical fall last night, landed on right hip. Had some pain, but immediately got up and was walking on it. This morning, had difficulty getting up and having increasing pain.  Patient was admitted for evaluation of closed right hip fracture and underwent repair on 4/5.  She is now able to ambulate and was seen by PT with recommendations for home health services.  No other needs noted and has been seen by orthopedics with plans to follow-up outpatient and remain on aspirin for DVT prophylaxis.  Discharge Diagnoses:  Principal Problem:   Closed right hip fracture (HCC) Active Problems:   Coronary atherosclerosis   Asthma   Hypertension   Tobacco abuse, in remission  Principal discharge diagnosis: Closed right hip fracture status post mechanical fall.  Discharge Instructions  Discharge Instructions     Ambulatory referral to Orthopedic Surgery   Complete by: As directed    Diet - low sodium heart healthy   Complete by: As directed    Increase activity slowly   Complete by: As directed       Allergies as of 03/04/2024       Reactions   Cortisone Other (See Comments)   Fluid  retention and hives        Medication List     TAKE these medications    albuterol (2.5 MG/3ML) 0.083% nebulizer solution Commonly known as: PROVENTIL Take 2.5 mg by nebulization 3 (three) times daily as needed for wheezing or shortness of breath.   albuterol 108 (90 Base) MCG/ACT inhaler Commonly known as: VENTOLIN HFA Inhale 1-2 puffs into the lungs every 6 (six) hours as needed for wheezing or shortness of breath.   amLODipine 5 MG tablet Commonly known as: NORVASC Take 1 tablet (5 mg total) by mouth daily.   aspirin 81 MG chewable tablet Chew 1 tablet (81 mg total) by mouth 2 (two) times daily. What changed: when to take this   atorvastatin 40 MG tablet Commonly known as: LIPITOR Take 40 mg by mouth daily.   atorvastatin 20 MG tablet Commonly known as: LIPITOR Take 1 tablet (20 mg total) by mouth daily.   benzonatate 100 MG capsule Commonly known as: TESSALON Take 100 mg by mouth daily.   cyanocobalamin 500 MCG tablet Commonly known as: VITAMIN B12 Take 500 mcg by mouth daily.   ezetimibe 10 MG tablet Commonly known as: ZETIA Take 10 mg by mouth daily.   Fish Oil 1000 MG Caps Take 1 capsule by mouth daily.   fluticasone 50 MCG/ACT nasal spray Commonly known as: FLONASE Place 1-2 sprays into both nostrils daily.   hydrochlorothiazide 12.5 MG tablet Commonly known as: HYDRODIURIL Take 12.5 mg by mouth daily.   levothyroxine 25 MCG tablet Commonly known as: SYNTHROID Take 25 mcg  by mouth every morning.   lisinopril 20 MG tablet Commonly known as: ZESTRIL Take 1 tablet (20 mg total) by mouth daily.   montelukast 10 MG tablet Commonly known as: SINGULAIR Take 10 mg by mouth daily.   niacin 500 MG tablet Commonly known as: VITAMIN B3 Take 500 mg by mouth at bedtime.   oxyCODONE 5 MG immediate release tablet Commonly known as: Roxicodone Take 1 tablet (5 mg total) by mouth every 8 (eight) hours as needed for breakthrough pain or severe pain  (pain score 7-10).        Follow-up Information     Nsumanganyi, Colleen Can, NP. Schedule an appointment as soon as possible for a visit in 1 week(s).   Contact information: [address] [place] [address] [phone]         Oliver Barre, MD Follow up in 2 week(s).   Specialties: Orthopedic Surgery, Sports Medicine Contact information: [address] [place] [address] [phone]                Allergies  Allergen Reactions   Cortisone Other (See Comments)    Fluid retention and hives    Consultations: Orthopedics   Procedures/Studies: DG HIP UNILAT WITH PELVIS 2-3 VIEWS RIGHT Result Date: 03/03/2024 CLINICAL DATA:  Postop. EXAM: DG HIP (WITH OR WITHOUT PELVIS) 2-3V RIGHT COMPARISON:  Preoperative imaging FINDINGS: Three screws traverse the right femoral neck fracture. Unchanged fracture alignment. No new fracture. Recent postsurgical change includes edema in the soft tissues. Previous left hip arthroplasty is partially included. IMPRESSION: ORIF of right femoral neck fracture. Electronically Signed   By: Narda Rutherford M.D.   On: 03/03/2024 15:14   DG HIP UNILAT WITH PELVIS 2-3 VIEWS RIGHT Result Date: 03/03/2024 CLINICAL DATA:  Right hip fracture. EXAM: DG HIP (WITH OR WITHOUT PELVIS) 2-3V RIGHT COMPARISON:  Preoperative imaging FINDINGS: Four fluoroscopic spot views of the right hip submitted from the operating room. Three screws traverse femoral neck fracture. Fluoroscopy time 1 minutes 14 seconds. Dose 19.51 mGy. IMPRESSION: Intraoperative fluoroscopy during right femoral neck fracture fixation. Electronically Signed   By: Narda Rutherford M.D.   On: 03/03/2024 14:53   DG C-Arm 1-60 Min-No Report Result Date: 03/03/2024 Fluoroscopy was utilized by the requesting physician.  No radiographic interpretation.   DG C-Arm 1-60 Min-No Report Result Date: 03/03/2024 Fluoroscopy was utilized by the requesting physician.  No radiographic  interpretation.   CT Hip Right Wo Contrast Result Date: 03/02/2024 CLINICAL DATA:  Right hip pain after fall. EXAM: CT OF THE RIGHT HIP WITHOUT CONTRAST TECHNIQUE: Multidetector CT imaging of the right hip was performed according to the standard protocol. Multiplanar CT image reconstructions were also generated. RADIATION DOSE REDUCTION: This exam was performed according to the departmental dose-optimization program which includes automated exposure control, adjustment of the mA and/or kV according to patient size and/or use of iterative reconstruction technique. COMPARISON:  Right hip radiographs dated 03/02/2024. FINDINGS: Bones/Joint/Cartilage There is a nondisplaced mildly impacted fracture of the right femoral head and neck junction, predominantly laterally (series 6, images 65-74 and series 3, images 56 and 64). The remainder of the visualized bones are intact. No dislocation. Sacroiliac joints and pubic symphysis are intact. Muscles and Tendons No intramuscular fluid collection. Soft tissue No fluid collection or hematoma. Visualized intrapelvic contents are unremarkable. Atherosclerotic vascular calcification. IMPRESSION: Nondisplaced mildly impacted fracture of the right femoral head and neck junction, most predominant laterally. Electronically Signed   By: Hart Robinsons  M.D.   On: 03/02/2024 13:17   DG Hip Unilat  With Pelvis 2-3 Views Right Result Date: 03/02/2024 CLINICAL DATA:  Patient fell last night.  Right hip pain. EXAM: DG HIP (WITH OR WITHOUT PELVIS) 2-3V RIGHT COMPARISON:  12/06/2022 FINDINGS: Bones are diffusely demineralized. SI joints and symphysis pubis unremarkable. No evidence for pubic ramus fracture. Status post left total hip replacement, incompletely visualized. AP and frog-leg lateral views of the right hip show evidence of impaction at the junction of the femoral neck and femoral head laterally, new in the interval since prior study. No discrete fracture line is evident by  x-ray. IMPRESSION: Evidence for impaction at the junction of the femoral neck and femoral head laterally, suspicious for fracture. No discrete fracture line is evident by x-ray. CT may be able to definitively characterize this although in the setting of incomplete fracture, MRI would be a more sensitive study with which to confirm. Electronically Signed   By: Kennith Center M.D.   On: 03/02/2024 11:07     Discharge Exam: Vitals:   03/03/24 2043 03/04/24 0519  BP: 136/69 (!) 98/58  Pulse: 66 85  Resp: 18 16  Temp: 98 F (36.7 C) 98.6 F (37 C)  SpO2: 99% 94%   Vitals:   03/03/24 1515 03/03/24 1613 03/03/24 2043 03/04/24 0519  BP: (!) 143/49 (!) 125/56 136/69 (!) 98/58  Pulse: 70 68 66 85  Resp: 18  18 16   Temp: 97.8 F (36.6 C) 97.9 F (36.6 C) 98 F (36.7 C) 98.6 F (37 C)  TempSrc:  Oral Oral Oral  SpO2: 100% 95% 99% 94%  Weight:      Height:        General: Pt is alert, awake, not in acute distress Cardiovascular: RRR, S1/S2 +, no rubs, no gallops Respiratory: CTA bilaterally, no wheezing, no rhonchi Abdominal: Soft, NT, ND, bowel sounds + Extremities: no edema, no cyanosis    The results of significant diagnostics from this hospitalization (including imaging, microbiology, ancillary and laboratory) are listed below for reference.     Microbiology: Recent Results (from the past 240 hours)  Surgical PCR screen     Status: None   Collection Time: 03/03/24  7:39 AM   Specimen: Nasal Mucosa; Nasal Swab  Result Value Ref Range Status   MRSA, PCR NEGATIVE NEGATIVE Final   Staphylococcus aureus NEGATIVE NEGATIVE Final    Comment: (NOTE) The Xpert SA Assay (FDA approved for NASAL specimens in patients 58 years of age and older), is one component of a comprehensive surveillance program. It is not intended to diagnose infection nor to guide or monitor treatment. Performed at [facility], [address], [place], [place] [address]      Labs: BNP (last 3 results) No  results for input(s): "BNP" in the last 8760 hours. Basic Metabolic Panel: Recent Labs  Lab 03/02/24 1609 03/04/24 0403  NA 132* 129*  K 3.2* 3.8  CL 97* 96*  CO2 21* 21*  GLUCOSE 117* 145*  BUN 10 10  CREATININE 0.81 0.94  CALCIUM 9.8 8.7*  MG  --  1.8   Liver Function Tests: Recent Labs  Lab 03/02/24 1609  AST 23  ALT 20  ALKPHOS 101  BILITOT 0.9  PROT 7.5  ALBUMIN 3.9   No results for input(s): "LIPASE", "AMYLASE" in the last 168 hours. No results for input(s): "AMMONIA" in the last 168 hours. CBC: Recent Labs  Lab 03/02/24 1609 03/04/24 0403  WBC 7.7 9.8  HGB 12.2  11.0*  HCT 34.8* 31.9*  MCV 95.3 97.3  PLT 295 258   Cardiac Enzymes: No results for input(s): "CKTOTAL", "CKMB", "CKMBINDEX", "TROPONINI" in the last 168 hours. BNP: Invalid input(s): "POCBNP" CBG: Recent Labs  Lab 03/03/24 0716 03/03/24 1612  GLUCAP 101* 130*   D-Dimer No results for input(s): "DDIMER" in the last 72 hours. Hgb A1c No results for input(s): "HGBA1C" in the last 72 hours. Lipid Profile No results for input(s): "CHOL", "HDL", "LDLCALC", "TRIG", "CHOLHDL", "LDLDIRECT" in the last 72 hours. Thyroid function studies Recent Labs    03/04/24 0840  TSH 0.653   Anemia work up No results for input(s): "VITAMINB12", "FOLATE", "FERRITIN", "TIBC", "IRON", "RETICCTPCT" in the last 72 hours. Urinalysis    Component Value Date/Time   COLORURINE AMBER (A) 05/01/2019 1534   APPEARANCEUR CLOUDY (A) 05/01/2019 1534   LABSPEC 1.021 05/01/2019 1534   PHURINE 5.0 05/01/2019 1534   GLUCOSEU NEGATIVE 05/01/2019 1534   HGBUR SMALL (A) 05/01/2019 1534   BILIRUBINUR NEGATIVE 05/01/2019 1534   KETONESUR NEGATIVE 05/01/2019 1534   PROTEINUR 100 (A) 05/01/2019 1534   NITRITE NEGATIVE 05/01/2019 1534   LEUKOCYTESUR NEGATIVE 05/01/2019 1534   Sepsis Labs Recent Labs  Lab 03/02/24 1609 03/04/24 0403  WBC 7.7 9.8   Microbiology Recent Results (from the past 240 hours)  Surgical PCR  screen     Status: None   Collection Time: 03/03/24  7:39 AM   Specimen: Nasal Mucosa; Nasal Swab  Result Value Ref Range Status   MRSA, PCR NEGATIVE NEGATIVE Final   Staphylococcus aureus NEGATIVE NEGATIVE Final    Comment: (NOTE) The Xpert SA Assay (FDA approved for NASAL specimens in patients 16 years of age and older), is one component of a comprehensive surveillance program. It is not intended to diagnose infection nor to guide or monitor treatment. Performed at [facility], [address] [place], [place] [address]      Time coordinating discharge: 35 minutes  SIGNED:   Erick Blinks, DO [facility] 03/04/2024, 10:47 AM  If 7PM-7AM, please contact night-coverage [email or web address]

## 2024-03-04 NOTE — Evaluation (Signed)
 Physical Therapy Evaluation Patient Details Name: [name] MRN: [id number] DOB: 02/05/1956 Today's Date: 03/04/2024  History of Present Illness  PT with Fx femoral neck; recieved CRPP on 4/5 WBAT  Clinical Impression  Pt mobility is very good.  Recommend HH to assess home environment and safety.          If plan is discharge home, recommend the following: Assist for transportation;Help with stairs or ramp for entrance;Assistance with cooking/housework   Can travel by private vehicle    yes    Equipment Recommendations None recommended by PT  Recommendations for Other Services    none   Functional Status Assessment Patient has had a recent decline in their functional status and demonstrates the ability to make significant improvements in function in a reasonable and predictable amount of time.     Precautions / Restrictions Precautions Precautions: None Recall of Precautions/Restrictions: Intact Restrictions Weight Bearing Restrictions Per Provider Order: Yes RLE Weight Bearing Per Provider Order: Weight bearing as tolerated      Mobility  Bed Mobility Overal bed mobility: Independent                  Transfers Overall transfer level: Modified independent Equipment used: Rolling walker (2 wheels)               General transfer comment: able to get in and out of bed, on and off commode    Ambulation/Gait Ambulation/Gait assistance: Modified independent (Device/Increase time) Gait Distance (Feet): 220 Feet Assistive device: Rolling walker (2 wheels) Gait Pattern/deviations: Step-through pattern               Pertinent Vitals/Pain Pain Assessment Pain Assessment: 0-10 Pain Score: 4  Pain Location: at end of ambulation Pain Descriptors / Indicators: Aching Pain Intervention(s): Limited activity within patient's tolerance, Monitored during session    Home Living Family/patient expects to be discharged to:: Private residence Living  Arrangements: Spouse/significant other Available Help at Discharge: Family Type of Home: Mobile home Home Access: Stairs to enter Entrance Stairs-Rails: Can reach both Entrance Stairs-Number of Steps: 4   Home Layout: One level Home Equipment: Agricultural consultant (2 wheels)      Prior Function Prior Level of Function : Independent/Modified Independent                     Extremity/Trunk Assessment        Lower Extremity Assessment Lower Extremity Assessment: Overall WFL for tasks assessed       Communication   Communication Communication: No apparent difficulties    Cognition Arousal: Alert Behavior During Therapy: WFL for tasks assessed/performed   PT - Cognitive impairments: No apparent impairments                         Following commands: Intact       Cueing Cueing Techniques: Verbal cues            Assessment/Plan    PT Assessment Patient needs continued PT services  PT Problem List Decreased strength       PT Treatment Interventions      PT Goals (Current goals can be found in the Care Plan section)  Acute Rehab PT Goals Patient Stated Goal: To g0 home PT Goal Formulation: With patient Potential to Achieve Goals: Good    Frequency Min 2X/week        AM-PAC PT "6 Clicks" Mobility  Outcome Measure Help needed turning from your back to  your side while in a flat bed without using bedrails?: None Help needed moving from lying on your back to sitting on the side of a flat bed without using bedrails?: None Help needed moving to and from a bed to a chair (including a wheelchair)?: None Help needed standing up from a chair using your arms (e.g., wheelchair or bedside chair)?: None Help needed to walk in hospital room?: None Help needed climbing 3-5 steps with a railing? : A Little 6 Click Score: 23    End of Session Equipment Utilized During Treatment: Gait belt Activity Tolerance: Patient tolerated treatment well Patient left:  in chair;with family/visitor present;with call bell/phone within reach Nurse Communication: Mobility status PT Visit Diagnosis: Muscle weakness (generalized) (M62.81)    Time: 9604-5409 PT Time Calculation (min) (ACUTE ONLY): 30 min   Charges:   PT Evaluation $PT Eval Low Complexity: 1 Low PT Treatments $Gait Training: 8-22 mins PT General Charges $$ ACUTE PT VISIT: 1 Visit       Virgina Organ, PT CLT [phone]  03/04/2024, 10:11 AM

## 2024-03-04 NOTE — Progress Notes (Addendum)
   03/04/24 1301  TOC Discharge Assessment  Final next level of care Home w Home Health Services  Once discharged, how will the patient get to their discharge location? Family/Friend - Photographer  Patient states their goals for this hospitalization and ongoing recovery are: Home with PT  DME Arranged Walker rolling  DME Agency Other - Comment  HH Arranged PT  HH Agency [facility]  Date [facility] Agency Contacted 03/04/24  Time [facility] Agency Contacted [phone]  Representative spoke with at [facility] Agency [place]   CM met with patient at bedside. Patient authorized CM to order [facility] PT. Patient reported she will purchase a walker at [facility]. LOC will  continue to monitor patient through interdisciplinary progression rounds.  [facility]  PT will provide care starting Wednesday 9, 2025.

## 2024-03-04 NOTE — Progress Notes (Signed)
   ORTHOPAEDIC PROGRESS NOTE  S/p for Right Hip CRPP  DOS: 03/03/2024  SUBJECTIVE: Her pain is much better.  She had no issues overnight.  She is ambulating in the hall with therapy  OBJECTIVE: PE:  Alert and oriented, no acute distress  Sitting gait, using a walker  Surgical dressings clean, dry and intact. Sensation intact to the right foot Active motion EHL/TA 2+ DP pulse  Vitals:   03/03/24 2043 03/04/24 0519  BP: 136/69 (!) 98/58  Pulse: 66 85  Resp: 18 16  Temp: 98 F (36.7 C) 98.6 F (37 C)  SpO2: 99% 94%     ASSESSMENT: [name] is a 68 y.o. female who is doing great 1 day status post CRPP of right femoral neck fracture.   PLAN: Weightbearing: Weightbearing as tolerated RLE Incisional and dressing care: Reinforce dressings as needed Orthopedic device(s): None VTE prophylaxis: Recommend Aspirin 81mg  BID; to begin POD#1 Pain control: PO pain medications as needed; judicious use of narcotics Follow - up plan: Recommend follow-up in clinic in 2-3 weeks.   Contact information:     Jobeth Pangilinan A. Dallas Schimke, MD MS [facility] [address] [place],  [place]  [address] Phone: [phone] Fax: [phone]

## 2024-03-06 ENCOUNTER — Encounter (HOSPITAL_COMMUNITY): Payer: Self-pay | Admitting: Orthopedic Surgery

## 2024-03-19 ENCOUNTER — Inpatient Hospital Stay (HOSPITAL_COMMUNITY): Admitting: Certified Registered"

## 2024-03-19 ENCOUNTER — Inpatient Hospital Stay (HOSPITAL_COMMUNITY)
Admission: EM | Admit: 2024-03-19 | Discharge: 2024-03-21 | DRG: 481 | Disposition: A | Discharge status: Skilled Nursing Facility | Attending: Internal Medicine | Admitting: Internal Medicine

## 2024-03-19 ENCOUNTER — Emergency Department (HOSPITAL_COMMUNITY)

## 2024-03-19 ENCOUNTER — Other Ambulatory Visit: Payer: Self-pay

## 2024-03-19 ENCOUNTER — Inpatient Hospital Stay (HOSPITAL_COMMUNITY)

## 2024-03-19 ENCOUNTER — Encounter (HOSPITAL_COMMUNITY)
Admission: EM | Disposition: A | Discharge status: Home Health | Payer: Self-pay | Source: Home / Self Care | Attending: Internal Medicine

## 2024-03-19 DIAGNOSIS — J45909 Unspecified asthma, uncomplicated: Secondary | ICD-10-CM | POA: Diagnosis present

## 2024-03-19 DIAGNOSIS — W19XXXA Unspecified fall, initial encounter: Secondary | ICD-10-CM | POA: Diagnosis present

## 2024-03-19 DIAGNOSIS — E039 Hypothyroidism, unspecified: Secondary | ICD-10-CM | POA: Diagnosis present

## 2024-03-19 DIAGNOSIS — Z9842 Cataract extraction status, left eye: Secondary | ICD-10-CM | POA: Diagnosis not present

## 2024-03-19 DIAGNOSIS — Z87891 Personal history of nicotine dependence: Secondary | ICD-10-CM | POA: Diagnosis not present

## 2024-03-19 DIAGNOSIS — I10 Essential (primary) hypertension: Secondary | ICD-10-CM

## 2024-03-19 DIAGNOSIS — Z7982 Long term (current) use of aspirin: Secondary | ICD-10-CM | POA: Diagnosis not present

## 2024-03-19 DIAGNOSIS — Z9049 Acquired absence of other specified parts of digestive tract: Secondary | ICD-10-CM | POA: Diagnosis not present

## 2024-03-19 DIAGNOSIS — E785 Hyperlipidemia, unspecified: Secondary | ICD-10-CM | POA: Diagnosis present

## 2024-03-19 DIAGNOSIS — Z8701 Personal history of pneumonia (recurrent): Secondary | ICD-10-CM

## 2024-03-19 DIAGNOSIS — Z961 Presence of intraocular lens: Secondary | ICD-10-CM | POA: Diagnosis present

## 2024-03-19 DIAGNOSIS — J42 Unspecified chronic bronchitis: Secondary | ICD-10-CM | POA: Diagnosis present

## 2024-03-19 DIAGNOSIS — Z9089 Acquired absence of other organs: Secondary | ICD-10-CM | POA: Diagnosis not present

## 2024-03-19 DIAGNOSIS — Z85828 Personal history of other malignant neoplasm of skin: Secondary | ICD-10-CM | POA: Diagnosis not present

## 2024-03-19 DIAGNOSIS — Z96642 Presence of left artificial hip joint: Secondary | ICD-10-CM | POA: Diagnosis present

## 2024-03-19 DIAGNOSIS — E871 Hypo-osmolality and hyponatremia: Secondary | ICD-10-CM | POA: Diagnosis present

## 2024-03-19 DIAGNOSIS — Z888 Allergy status to other drugs, medicaments and biological substances status: Secondary | ICD-10-CM | POA: Diagnosis not present

## 2024-03-19 DIAGNOSIS — Z79899 Other long term (current) drug therapy: Secondary | ICD-10-CM

## 2024-03-19 DIAGNOSIS — S72001A Fracture of unspecified part of neck of right femur, initial encounter for closed fracture: Secondary | ICD-10-CM | POA: Diagnosis not present

## 2024-03-19 DIAGNOSIS — Z90710 Acquired absence of both cervix and uterus: Secondary | ICD-10-CM | POA: Diagnosis not present

## 2024-03-19 DIAGNOSIS — D72829 Elevated white blood cell count, unspecified: Secondary | ICD-10-CM | POA: Diagnosis present

## 2024-03-19 DIAGNOSIS — S72009A Fracture of unspecified part of neck of unspecified femur, initial encounter for closed fracture: Secondary | ICD-10-CM | POA: Diagnosis present

## 2024-03-19 DIAGNOSIS — Z9889 Other specified postprocedural states: Secondary | ICD-10-CM

## 2024-03-19 DIAGNOSIS — K219 Gastro-esophageal reflux disease without esophagitis: Secondary | ICD-10-CM | POA: Diagnosis present

## 2024-03-19 DIAGNOSIS — I2510 Atherosclerotic heart disease of native coronary artery without angina pectoris: Secondary | ICD-10-CM | POA: Diagnosis present

## 2024-03-19 DIAGNOSIS — Z9841 Cataract extraction status, right eye: Secondary | ICD-10-CM

## 2024-03-19 DIAGNOSIS — S7221XA Displaced subtrochanteric fracture of right femur, initial encounter for closed fracture: Secondary | ICD-10-CM | POA: Diagnosis not present

## 2024-03-19 DIAGNOSIS — Y92099 Unspecified place in other non-institutional residence as the place of occurrence of the external cause: Secondary | ICD-10-CM | POA: Diagnosis not present

## 2024-03-19 DIAGNOSIS — Z79890 Hormone replacement therapy (postmenopausal): Secondary | ICD-10-CM

## 2024-03-19 DIAGNOSIS — Z809 Family history of malignant neoplasm, unspecified: Secondary | ICD-10-CM

## 2024-03-19 DIAGNOSIS — F17200 Nicotine dependence, unspecified, uncomplicated: Secondary | ICD-10-CM | POA: Diagnosis present

## 2024-03-19 DIAGNOSIS — M25551 Pain in right hip: Principal | ICD-10-CM

## 2024-03-19 HISTORY — PX: INTRAMEDULLARY (IM) NAIL INTERTROCHANTERIC: SHX5875

## 2024-03-19 HISTORY — PX: HARDWARE REMOVAL: SHX979

## 2024-03-19 LAB — CBC WITH DIFFERENTIAL/PLATELET
Abs Immature Granulocytes: 0.06 10*3/uL (ref 0.00–0.07)
Basophils Absolute: 0.1 10*3/uL (ref 0.0–0.1)
Basophils Relative: 1 %
Eosinophils Absolute: 0 10*3/uL (ref 0.0–0.5)
Eosinophils Relative: 0 %
HCT: 32.6 % — ABNORMAL LOW (ref 36.0–46.0)
Hemoglobin: 11.6 g/dL — ABNORMAL LOW (ref 12.0–15.0)
Immature Granulocytes: 1 %
Lymphocytes Relative: 8 %
Lymphs Abs: 0.8 10*3/uL (ref 0.7–4.0)
MCH: 32.8 pg (ref 26.0–34.0)
MCHC: 35.6 g/dL (ref 30.0–36.0)
MCV: 92.1 fL (ref 80.0–100.0)
Monocytes Absolute: 0.5 10*3/uL (ref 0.1–1.0)
Monocytes Relative: 5 %
Neutro Abs: 9.1 10*3/uL — ABNORMAL HIGH (ref 1.7–7.7)
Neutrophils Relative %: 85 %
Platelets: 393 10*3/uL (ref 150–400)
RBC: 3.54 MIL/uL — ABNORMAL LOW (ref 3.87–5.11)
RDW: 11.8 % (ref 11.5–15.5)
WBC: 10.6 10*3/uL — ABNORMAL HIGH (ref 4.0–10.5)
nRBC: 0 % (ref 0.0–0.2)

## 2024-03-19 LAB — BASIC METABOLIC PANEL WITH GFR
Anion gap: 14 (ref 5–15)
BUN: 6 mg/dL — ABNORMAL LOW (ref 8–23)
CO2: 22 mmol/L (ref 22–32)
Calcium: 8.8 mg/dL — ABNORMAL LOW (ref 8.9–10.3)
Chloride: 86 mmol/L — ABNORMAL LOW (ref 98–111)
Creatinine, Ser: 0.75 mg/dL (ref 0.44–1.00)
GFR, Estimated: >60 mL/min (ref >60)
Glucose, Bld: 128 mg/dL — ABNORMAL HIGH (ref 70–99)
Potassium: 3.2 mmol/L — ABNORMAL LOW (ref 3.5–5.1)
Sodium: 122 mmol/L — ABNORMAL LOW (ref 135–145)

## 2024-03-19 LAB — POCT I-STAT, CHEM 8
BUN: 5 mg/dL — ABNORMAL LOW (ref 8–23)
Calcium, Ion: 1.08 mmol/L — ABNORMAL LOW (ref 1.15–1.40)
Chloride: 90 mmol/L — ABNORMAL LOW (ref 98–111)
Creatinine, Ser: 0.7 mg/dL (ref 0.44–1.00)
Glucose, Bld: 133 mg/dL — ABNORMAL HIGH (ref 70–99)
HCT: 35 % — ABNORMAL LOW (ref 36.0–46.0)
Hemoglobin: 11.9 g/dL — ABNORMAL LOW (ref 12.0–15.0)
Potassium: 3.5 mmol/L (ref 3.5–5.1)
Sodium: 125 mmol/L — ABNORMAL LOW (ref 135–145)
TCO2: 23 mmol/L (ref 22–32)

## 2024-03-19 LAB — OSMOLALITY: Osmolality: 259 mosm/kg — ABNORMAL LOW (ref 275–295)

## 2024-03-19 LAB — TSH: TSH: 0.964 u[IU]/mL (ref 0.350–4.500)

## 2024-03-19 LAB — SODIUM: Sodium: 126 mmol/L — ABNORMAL LOW (ref 135–145)

## 2024-03-19 SURGERY — REMOVAL, HARDWARE
Anesthesia: General | Site: Hip | Laterality: Right

## 2024-03-19 MED ORDER — ONDANSETRON HCL 4 MG PO TABS: 4.0000 mg | ORAL_TABLET | Freq: Four times a day (QID) | ORAL | Status: DC | PRN

## 2024-03-19 MED ORDER — SODIUM CHLORIDE 0.9 % IV SOLN: INTRAVENOUS | Status: AC

## 2024-03-19 MED ORDER — STERILE WATER FOR IRRIGATION IR SOLN
Status: DC | PRN
  Administered 2024-03-19: 500 mL

## 2024-03-19 MED ORDER — POTASSIUM CHLORIDE CRYS ER 20 MEQ PO TBCR
40.0000 meq | EXTENDED_RELEASE_TABLET | Freq: Two times a day (BID) | ORAL | Status: AC
  Administered 2024-03-19 (×2): 40 meq via ORAL
  Filled 2024-03-19 (×2): qty 2

## 2024-03-19 MED ORDER — DEXMEDETOMIDINE HCL IN NACL 80 MCG/20ML IV SOLN
INTRAVENOUS | Status: DC | PRN
  Administered 2024-03-19 (×2): 8 ug via INTRAVENOUS

## 2024-03-19 MED ORDER — OXYCODONE HCL 5 MG/5ML PO SOLN: 5.0000 mg | Freq: Once | ORAL | Status: DC | PRN

## 2024-03-19 MED ORDER — ACETAMINOPHEN 10 MG/ML IV SOLN
INTRAVENOUS | Status: AC
  Filled 2024-03-19: qty 100

## 2024-03-19 MED ORDER — ONDANSETRON HCL 4 MG/2ML IJ SOLN
INTRAMUSCULAR | Status: AC
  Filled 2024-03-19: qty 2

## 2024-03-19 MED ORDER — FENTANYL CITRATE (PF) 250 MCG/5ML IJ SOLN
INTRAMUSCULAR | Status: AC
  Filled 2024-03-19: qty 5

## 2024-03-19 MED ORDER — 0.9 % SODIUM CHLORIDE (POUR BTL) OPTIME
TOPICAL | Status: DC | PRN
  Administered 2024-03-19 (×2): 1000 mL

## 2024-03-19 MED ORDER — LIDOCAINE HCL (PF) 2 % IJ SOLN
INTRAMUSCULAR | Status: AC
  Filled 2024-03-19: qty 5

## 2024-03-19 MED ORDER — TRANEXAMIC ACID-NACL 1000-0.7 MG/100ML-% IV SOLN
1000.0000 mg | INTRAVENOUS | Status: AC
  Administered 2024-03-19: 1000 mg via INTRAVENOUS
  Filled 2024-03-19: qty 100

## 2024-03-19 MED ORDER — PROPOFOL 10 MG/ML IV BOLUS
INTRAVENOUS | Status: DC | PRN
Start: 2024-03-19 — End: 2024-03-19
  Administered 2024-03-19 (×2): 50 mg via INTRAVENOUS
  Administered 2024-03-19: 150 mg via INTRAVENOUS

## 2024-03-19 MED ORDER — ACETAMINOPHEN 325 MG PO TABS
650.0000 mg | ORAL_TABLET | Freq: Four times a day (QID) | ORAL | Status: DC | PRN
  Administered 2024-03-20 – 2024-03-21 (×2): 650 mg via ORAL
  Filled 2024-03-19 (×2): qty 2

## 2024-03-19 MED ORDER — FENTANYL CITRATE PF 50 MCG/ML IJ SOSY: 25.0000 ug | PREFILLED_SYRINGE | INTRAMUSCULAR | Status: DC | PRN

## 2024-03-19 MED ORDER — SCOPOLAMINE 1 MG/3DAYS TD PT72
1.0000 | MEDICATED_PATCH | Freq: Once | TRANSDERMAL | Status: DC
  Administered 2024-03-19: 1.5 mg via TRANSDERMAL
  Filled 2024-03-19: qty 1

## 2024-03-19 MED ORDER — LACTATED RINGERS IV SOLN: INTRAVENOUS | Status: DC

## 2024-03-19 MED ORDER — AMLODIPINE BESYLATE 5 MG PO TABS
5.0000 mg | ORAL_TABLET | Freq: Every day | ORAL | Status: DC
  Filled 2024-03-19: qty 1

## 2024-03-19 MED ORDER — ROCURONIUM BROMIDE 10 MG/ML (PF) SYRINGE
PREFILLED_SYRINGE | INTRAVENOUS | Status: DC | PRN
Start: 2024-03-19 — End: 2024-03-19
  Administered 2024-03-19: 50 mg via INTRAVENOUS

## 2024-03-19 MED ORDER — ROCURONIUM BROMIDE 10 MG/ML (PF) SYRINGE
PREFILLED_SYRINGE | INTRAVENOUS | Status: AC
  Filled 2024-03-19: qty 10

## 2024-03-19 MED ORDER — OXYCODONE HCL 5 MG PO TABS: 5.0000 mg | ORAL_TABLET | Freq: Once | ORAL | Status: DC | PRN

## 2024-03-19 MED ORDER — PHENYLEPHRINE 80 MCG/ML (10ML) SYRINGE FOR IV PUSH (FOR BLOOD PRESSURE SUPPORT)
PREFILLED_SYRINGE | INTRAVENOUS | Status: DC | PRN
  Administered 2024-03-19 (×3): 160 ug via INTRAVENOUS
  Administered 2024-03-19: 80 ug via INTRAVENOUS

## 2024-03-19 MED ORDER — LEVOTHYROXINE SODIUM 25 MCG PO TABS
25.0000 ug | ORAL_TABLET | Freq: Every day | ORAL | Status: DC
  Administered 2024-03-20 – 2024-03-21 (×2): 25 ug via ORAL
  Filled 2024-03-19 (×2): qty 1

## 2024-03-19 MED ORDER — ACETAMINOPHEN 10 MG/ML IV SOLN
INTRAVENOUS | Status: DC | PRN
  Administered 2024-03-19: 1000 mg via INTRAVENOUS

## 2024-03-19 MED ORDER — ORAL CARE MOUTH RINSE: 15.0000 mL | Freq: Once | OROMUCOSAL | Status: DC

## 2024-03-19 MED ORDER — SUCCINYLCHOLINE CHLORIDE 200 MG/10ML IV SOSY
PREFILLED_SYRINGE | INTRAVENOUS | Status: DC | PRN
  Administered 2024-03-19: 100 mg via INTRAVENOUS

## 2024-03-19 MED ORDER — SODIUM CHLORIDE 0.9 % IV BOLUS
1000.0000 mL | Freq: Once | INTRAVENOUS | Status: AC
  Administered 2024-03-19: 1000 mL via INTRAVENOUS

## 2024-03-19 MED ORDER — PHENYLEPHRINE 80 MCG/ML (10ML) SYRINGE FOR IV PUSH (FOR BLOOD PRESSURE SUPPORT)
PREFILLED_SYRINGE | INTRAVENOUS | Status: AC
  Filled 2024-03-19: qty 10

## 2024-03-19 MED ORDER — EPHEDRINE 5 MG/ML INJ
INTRAVENOUS | Status: AC
  Filled 2024-03-19: qty 5

## 2024-03-19 MED ORDER — SUGAMMADEX SODIUM 200 MG/2ML IV SOLN
INTRAVENOUS | Status: DC | PRN
  Administered 2024-03-19 (×2): 100 mg via INTRAVENOUS

## 2024-03-19 MED ORDER — CEFAZOLIN SODIUM-DEXTROSE 2-4 GM/100ML-% IV SOLN
2.0000 g | Freq: Three times a day (TID) | INTRAVENOUS | Status: AC
  Administered 2024-03-19 – 2024-03-20 (×3): 2 g via INTRAVENOUS
  Filled 2024-03-19 (×2): qty 100

## 2024-03-19 MED ORDER — ONDANSETRON HCL 4 MG/2ML IJ SOLN: 4.0000 mg | Freq: Once | INTRAMUSCULAR | Status: DC | PRN

## 2024-03-19 MED ORDER — CHLORHEXIDINE GLUCONATE 0.12 % MT SOLN
15.0000 mL | Freq: Once | OROMUCOSAL | Status: AC
  Administered 2024-03-19: 15 mL via OROMUCOSAL

## 2024-03-19 MED ORDER — ONDANSETRON HCL 4 MG/2ML IJ SOLN
4.0000 mg | Freq: Four times a day (QID) | INTRAMUSCULAR | Status: DC | PRN
Start: 2024-03-19 — End: 2024-03-21
  Administered 2024-03-19 (×2): 4 mg via INTRAVENOUS
  Filled 2024-03-19: qty 2

## 2024-03-19 MED ORDER — FENTANYL CITRATE PF 50 MCG/ML IJ SOSY
50.0000 ug | PREFILLED_SYRINGE | INTRAMUSCULAR | Status: DC | PRN
  Administered 2024-03-19: 50 ug via INTRAVENOUS
  Filled 2024-03-19: qty 1

## 2024-03-19 MED ORDER — BUPIVACAINE-EPINEPHRINE (PF) 0.5% -1:200000 IJ SOLN
INTRAMUSCULAR | Status: AC
  Filled 2024-03-19: qty 30

## 2024-03-19 MED ORDER — BUPIVACAINE HCL (PF) 0.5 % IJ SOLN
INTRAMUSCULAR | Status: DC | PRN
  Administered 2024-03-19: 30 mL

## 2024-03-19 MED ORDER — ORAL CARE MOUTH RINSE: 15.0000 mL | Freq: Once | OROMUCOSAL | Status: AC

## 2024-03-19 MED ORDER — SUCCINYLCHOLINE CHLORIDE 200 MG/10ML IV SOSY
PREFILLED_SYRINGE | INTRAVENOUS | Status: AC
  Filled 2024-03-19: qty 10

## 2024-03-19 MED ORDER — ONDANSETRON HCL 4 MG/2ML IJ SOLN: INTRAMUSCULAR | Status: DC | PRN

## 2024-03-19 MED ORDER — ACETAMINOPHEN 10 MG/ML IV SOLN: INTRAVENOUS | Status: DC | PRN

## 2024-03-19 MED ORDER — PHENYLEPHRINE HCL-NACL 20-0.9 MG/250ML-% IV SOLN
INTRAVENOUS | Status: AC
  Filled 2024-03-19: qty 250

## 2024-03-19 MED ORDER — CEFAZOLIN SODIUM-DEXTROSE 2-4 GM/100ML-% IV SOLN
2.0000 g | INTRAVENOUS | Status: AC
  Administered 2024-03-19: 2 g via INTRAVENOUS
  Filled 2024-03-19: qty 100

## 2024-03-19 MED ORDER — HYDROMORPHONE HCL 1 MG/ML IJ SOLN
0.5000 mg | INTRAMUSCULAR | Status: DC | PRN
  Administered 2024-03-19 – 2024-03-21 (×5): 0.5 mg via INTRAVENOUS
  Filled 2024-03-19 (×5): qty 0.5

## 2024-03-19 MED ORDER — PROPOFOL 10 MG/ML IV BOLUS
INTRAVENOUS | Status: AC
  Filled 2024-03-19: qty 20

## 2024-03-19 MED ORDER — LIDOCAINE HCL (PF) 2 % IJ SOLN
INTRAMUSCULAR | Status: DC | PRN
  Administered 2024-03-19: 100 mg via INTRADERMAL

## 2024-03-19 MED ORDER — EPHEDRINE SULFATE-NACL 50-0.9 MG/10ML-% IV SOSY
PREFILLED_SYRINGE | INTRAVENOUS | Status: DC | PRN
Start: 2024-03-19 — End: 2024-03-19
  Administered 2024-03-19: 10 mg via INTRAVENOUS
  Administered 2024-03-19: 5 mg via INTRAVENOUS

## 2024-03-19 MED ORDER — PHENYLEPHRINE HCL-NACL 20-0.9 MG/250ML-% IV SOLN
INTRAVENOUS | Status: DC | PRN
Start: 2024-03-19 — End: 2024-03-19
  Administered 2024-03-19: 40 ug/min via INTRAVENOUS

## 2024-03-19 MED ORDER — CHLORHEXIDINE GLUCONATE 0.12 % MT SOLN: 15.0000 mL | Freq: Once | OROMUCOSAL | Status: DC

## 2024-03-19 MED ORDER — FENTANYL CITRATE (PF) 250 MCG/5ML IJ SOLN
INTRAMUSCULAR | Status: DC | PRN
  Administered 2024-03-19 (×3): 50 ug via INTRAVENOUS
  Administered 2024-03-19 (×2): 25 ug via INTRAVENOUS
  Administered 2024-03-19: 50 ug via INTRAVENOUS

## 2024-03-19 SURGICAL SUPPLY — 48 items
BLADE SURG SZ10 CARB STEEL (BLADE) ×2 IMPLANT
CHLORAPREP W/TINT 26 (MISCELLANEOUS) ×2 IMPLANT
CLOTH BEACON ORANGE TIMEOUT ST (SAFETY) ×2 IMPLANT
COVER LIGHT HANDLE STERIS (MISCELLANEOUS) ×6 IMPLANT
COVER MAYO STAND XLG (MISCELLANEOUS) ×2 IMPLANT
COVER PERINEAL POST (MISCELLANEOUS) ×2 IMPLANT
DRAPE C-ARM FOLDED MOBILE STRL (DRAPES) IMPLANT
DRAPE C-ARMOR (DRAPES) IMPLANT
DRAPE HALF SHEET 40X57 (DRAPES) IMPLANT
DRAPE ORTHO 2.5IN SPLIT 77X108 (DRAPES) IMPLANT
DRAPE STERI IOBAN 125X83 (DRAPES) ×2 IMPLANT
DRSG MEPILEX SACRM 8.7X9.8 (GAUZE/BANDAGES/DRESSINGS) ×2 IMPLANT
DRSG TEGADERM 4X4.75 (GAUZE/BANDAGES/DRESSINGS) IMPLANT
ELECTRODE REM PT RTRN 9FT ADLT (ELECTROSURGICAL) ×4 IMPLANT
GAUZE SPONGE 4X4 12PLY STRL (GAUZE/BANDAGES/DRESSINGS) IMPLANT
GAUZE XEROFORM 1X8 LF (GAUZE/BANDAGES/DRESSINGS) IMPLANT
GLOVE BIO SURGEON STRL SZ8 (GLOVE) ×4 IMPLANT
GLOVE BIOGEL PI IND STRL 7.0 (GLOVE) ×8 IMPLANT
GLOVE BIOGEL PI IND STRL 8 (GLOVE) ×2 IMPLANT
GLOVE BIOGEL PI IND STRL 8.5 (GLOVE) IMPLANT
GLOVE SKINSENSE STRL SZ8.0 LF (GLOVE) IMPLANT
GOWN STRL REUS W/TWL LRG LVL3 (GOWN DISPOSABLE) ×2 IMPLANT
GOWN STRL REUS W/TWL XL LVL3 (GOWN DISPOSABLE) ×4 IMPLANT
GUIDEWIRE ORTH 900X3XBALL NOSE (WIRE) IMPLANT
KIT TURNOVER KIT A (KITS) ×2 IMPLANT
MANIFOLD NEPTUNE II (INSTRUMENTS) ×2 IMPLANT
MARKER SKIN DUAL TIP RULER LAB (MISCELLANEOUS) ×2 IMPLANT
MAT GRAY ABSORB FLUID 28X50 (MISCELLANEOUS) ×2 IMPLANT
NAIL RIGHT 10X39X125 TROCHANTE (Nail) IMPLANT
NDL HYPO 21X1.5 SAFETY (NEEDLE) ×2 IMPLANT
NEEDLE HYPO 21X1.5 SAFETY (NEEDLE) ×2 IMPLANT
NS IRRIG 1000ML POUR BTL (IV SOLUTION) ×2 IMPLANT
PACK BASIC LIMB (CUSTOM PROCEDURE TRAY) ×2 IMPLANT
PAD ARMBOARD POSITIONER FOAM (MISCELLANEOUS) ×4 IMPLANT
PIN GUIDE THRD AR 3.2X330 (PIN) IMPLANT
SCREW CORT CAPT FT 5.0X38 (Screw) IMPLANT
SCREW TELESCOPING LAG 10.5X90 (Screw) IMPLANT
SET BASIN LINEN APH (SET/KITS/TRAYS/PACK) ×2 IMPLANT
SPONGE T-LAP 18X18 ~~LOC~~+RFID (SPONGE) ×6 IMPLANT
STAPLER VISISTAT (STAPLE) IMPLANT
SUT MNCRL AB 4-0 PS2 18 (SUTURE) ×2 IMPLANT
SUT MON AB 2-0 CT1 36 (SUTURE) ×2 IMPLANT
SUT STRATAFIX PDS 30 CT-1 (SUTURE) IMPLANT
SUT VIC AB 0 CT1 27XBRD ANTBC (SUTURE) ×2 IMPLANT
SYR 30ML LL (SYRINGE) ×4 IMPLANT
SYR BULB IRRIG 60ML STRL (SYRINGE) ×6 IMPLANT
TRAY FOLEY MTR SLVR 16FR STAT (SET/KITS/TRAYS/PACK) ×2 IMPLANT
YANKAUER SUCT BULB TIP 10FT TU (MISCELLANEOUS) ×2 IMPLANT

## 2024-03-19 NOTE — Transfer of Care (Cosign Needed Addendum)
 Immediate Anesthesia Transfer of Care Note  Patient: SHERALEE QAZI  Procedure(s) Performed: REMOVAL, HARDWARE (Right: Hip) FIXATION, FRACTURE, INTERTROCHANTERIC, WITH INTRAMEDULLARY ROD (Right: Hip)  Patient Location: PACU  Anesthesia Type:General  Level of Consciousness: awake and patient cooperative  Airway & Oxygen Therapy: Patient Spontanous Breathing and Patient connected to face mask oxygen  Post-op Assessment: Report given to RN and Post -op Vital signs reviewed and stable  Post vital signs: Reviewed and stable  Last Vitals:  Vitals Value Taken Time  BP 106/94 (99) 03/19/24   1642  Temp 37.1 03/19/24   1642  Pulse 77 03/19/24   1642  Resp 16 03/19/24   1642  SpO2 94% 03/19/24   1642    Last Pain:  Pain 0/10    Patients Stated Pain Goal: 7 (03/19/24 1308)  Complications: No notable events documented.

## 2024-03-19 NOTE — Consult Note (Signed)
 ORTHOPAEDIC CONSULTATION  REQUESTING PHYSICIAN: Cornelius Dill, DO  ASSESSMENT AND PLAN: 68 y.o. female with the following: Right Hip Subtrochanteric femur fracture, following recent R hip CRPP 03/03/24  This patient requires inpatient admission to the hospitalist, to include preoperative clearance and perioperative medical management  - Weight Bearing Status/Activity: NWB Right lower extremity  - Additional recommended labs/tests: Preop Labs: CBC, BMP, PT/INR, Chest XR, and EKG  -VTE Prophylaxis: Please hold prior to OR; to resume POD#1 at the discretion of the primary team  - Pain control: Recommend PO pain medications PRN; judicious use of narcotics  - Follow-up plan: F/u 10-14 days postop  -Procedures: Plan for OR once patient has been medically optimized  Plan for Removal of cannulated screws from right femoral neck and placement of Right Cephalomedullary nail   NPO  Plan for OR today    Chief Complaint: Right hip pain  HPI: [name] is a 68 y.o. female who presented to the ED for evaluation after sustaining a mechanical fall.  She previously sustained a mechanical fall, couple of weeks ago.  She subsequently underwent a right hip CRPP on March 03, 2024.  Immediate recovery was uneventful.  She was discharged home.  She had been doing well, when she had a stumble earlier today.  She had immediate pain in her right hip.  She presented to emergency department.  She was noted to have a fracture, distal to the previous hardware.  Her care was initially discussed with my partner, and Dr. Phyllis Breeze contacted me directly, since I had a recent relationship with her.  She continues to have pain in the right hip.  No numbness or tingling.  No pain elsewhere.  She reports that that she did not fall completely, and therefore did not hit her head.      Past Medical History:  Diagnosis Date   Coronary artery disease    Endometriosis    Hyperlipidemia    Hypertension    Hypotension     Ruptured cervical disc    Skin cancer    Past Surgical History:  Procedure Laterality Date   ABDOMINAL HYSTERECTOMY     ANTERIOR APPROACH HEMI HIP ARTHROPLASTY Left 05/29/2016   Procedure: ANTERIOR APPROACH TOTAL HIP ARTHROPLASTY ;  Surgeon: Arnie Lao, MD;  Location: [facility];  Service: Orthopedics;  Laterality: Left;   ARTERY BIOPSY Right 05/03/2019   Procedure: MINOR BIOPSY TEMPORAL ARTERY;  Surgeon: Awilda Bogus, MD;  Location: [facility];  Service: General;  Laterality: Right;   CARDIAC CATHETERIZATION     CATARACT EXTRACTION W/PHACO Right 06/18/2022   Procedure: CATARACT EXTRACTION PHACO AND INTRAOCULAR LENS PLACEMENT (IOC);  Surgeon: Tarri Farm, MD;  Location: [facility];  Service: Ophthalmology;  Laterality: Right;  CDE 115.90   CATARACT EXTRACTION W/PHACO Left 07/02/2022   Procedure: CATARACT EXTRACTION PHACO AND INTRAOCULAR LENS PLACEMENT (IOC);  Surgeon: Tarri Farm, MD;  Location: [facility];  Service: Ophthalmology;  Laterality: Left;  CDE: 5.85   CHOLECYSTECTOMY     HIP PINNING,CANNULATED Right 03/03/2024   Procedure: FIXATION, FEMUR, NECK, PERCUTANEOUS, USING SCREW;  Surgeon: Tonita Frater, MD;  Location: [facility];  Service: Orthopedics;  Laterality: Right;   TONSILLECTOMY     Social History   Socioeconomic History   Marital status: Married    Spouse name: Not on file   Number of children: Not on file   Years of education: Not on file   Highest education level: Not on file  Occupational History  Not on file  Tobacco Use   Smoking status: Former    Current packs/day: 0.00    Types: Cigarettes    Quit date: 05/01/2019    Years since quitting: 4.8   Smokeless tobacco: Never  Vaping Use   Vaping status: Never Used  Substance and Sexual Activity   Alcohol use: Yes    Comment: occassional   Drug use: Not Currently   Sexual activity: Not on file  Other Topics Concern   Not on file  Social History Narrative   Not on file   Social Drivers of Health    Financial Resource Strain: Not on file  Food Insecurity: No Food Insecurity (03/19/2024)   Hunger Vital Sign    Worried About Running Out of Food in the Last Year: Never true    Ran Out of Food in the Last Year: Never true  Transportation Needs: No Transportation Needs (03/19/2024)   PRAPARE - Administrator, Civil Service (Medical): No    Lack of Transportation (Non-Medical): No  Physical Activity: Not on file  Stress: Not on file  Social Connections: Moderately Isolated (03/19/2024)   Social Connection and Isolation Panel [NHANES]    Frequency of Communication with Friends and Family: More than three times a week    Frequency of Social Gatherings with Friends and Family: More than three times a week    Attends Religious Services: Never    Database administrator or Organizations: No    Attends Engineer, structural: Never    Marital Status: Married   Family History  Problem Relation Age of Onset   Cancer Mother    Cancer Father    Allergies  Allergen Reactions   Cortisone Other (See Comments)    Fluid retention and hives   Prior to Admission medications   Medication Sig Start Date End Date Taking? Authorizing Provider  albuterol  (PROVENTIL ) (2.5 MG/3ML) 0.083% nebulizer solution Take 2.5 mg by nebulization 3 (three) times daily as needed for wheezing or shortness of breath. 01/04/24   [provider]  albuterol  (VENTOLIN  HFA) 108 (90 Base) MCG/ACT inhaler Inhale 1-2 puffs into the lungs every 6 (six) hours as needed for wheezing or shortness of breath. 02/11/24   [provider]  amLODipine  (NORVASC ) 5 MG tablet Take 1 tablet (5 mg total) by mouth daily. 01/09/20   Luane Rumps, PA-C  aspirin  81 MG chewable tablet Chew 1 tablet (81 mg total) by mouth 2 (two) times daily. 03/04/24 04/03/24  Mason Sole, Pratik D, DO  atorvastatin  (LIPITOR) 20 MG tablet Take 1 tablet (20 mg total) by mouth daily. 03/04/24   Mason Sole, Pratik D, DO  atorvastatin  (LIPITOR) 40 MG  tablet Take 40 mg by mouth daily. 01/04/24   [provider]  benzonatate  (TESSALON ) 100 MG capsule Take 100 mg by mouth daily. 02/14/24   [provider]  cyanocobalamin (VITAMIN B12) 500 MCG tablet Take 500 mcg by mouth daily.    [provider]  ezetimibe (ZETIA) 10 MG tablet Take 10 mg by mouth daily. 02/11/24   [provider]  fluticasone  (FLONASE ) 50 MCG/ACT nasal spray Place 1-2 sprays into both nostrils daily. 01/24/24   [provider]  hydrochlorothiazide (HYDRODIURIL) 12.5 MG tablet Take 12.5 mg by mouth daily. 01/04/24   [provider]  levothyroxine  (SYNTHROID ) 25 MCG tablet Take 25 mcg by mouth every morning. 12/30/23   [provider]  lisinopril  (ZESTRIL ) 20 MG tablet Take 1 tablet (20 mg total) by  mouth daily. 01/09/20   Luane Rumps, PA-C  montelukast  (SINGULAIR ) 10 MG tablet Take 10 mg by mouth daily. 01/24/24   [provider]  niacin (VITAMIN B3) 500 MG tablet Take 500 mg by mouth at bedtime.    [provider]  Omega-3 Fatty Acids (FISH OIL) 1000 MG CAPS Take 1 capsule by mouth daily.    [provider]  oxyCODONE  (ROXICODONE ) 5 MG immediate release tablet Take 1 tablet (5 mg total) by mouth every 8 (eight) hours as needed for breakthrough pain or severe pain (pain score 7-10). 03/04/24 03/04/25  Doreene Gammon D, DO   DG Femur Min 2 Views Right Result Date: 03/19/2024 CLINICAL DATA:  Status post fall. EXAM: RIGHT FEMUR 2 VIEWS COMPARISON:  03/03/2024 FINDINGS: Three screws from previous ORIF of right femoral neck fracture are again noted. There is a new, acute fracture involving the proximal right femur at the level of the lesser trochanter. Slight impaction and medial angulation of the distal fracture fragments noted. The distal right femur appears intact. IMPRESSION: Acute fracture involving the proximal right femur at the level of the lesser trochanter. Electronically Signed   By: Kimberley Penman  M.D.   On: 03/19/2024 07:20   DG Hip Unilat W or Wo Pelvis 2-3 Views Right Result Date: 03/19/2024 CLINICAL DATA:  Hip pain EXAM: DG HIP (WITH OR WITHOUT PELVIS) 2-3V RIGHT COMPARISON:  03/03/2024 FINDINGS: There are 3 cannulated screws are noted traversing the right femoral neck fracture. There is a new, acute fracture involving the proximal right femur just below the level of the lesser trochanter. Medial angulation of the distal fracture fragments identified. Status post left total hip arthroplasty. IMPRESSION: Acute fracture involving the proximal right femur just below the level of the lesser trochanter. Electronically Signed   By: Kimberley Penman M.D.   On: 03/19/2024 07:19   Family History Reviewed and non-contributory, no pertinent history of problems with bleeding or anesthesia    Review of Systems No fevers or chills No numbness or tingling No chest pain No shortness of breath No bowel or bladder dysfunction No GI distress No headaches    OBJECTIVE  Vitals:Patient Vitals for the past 8 hrs:  BP Temp Temp src Pulse Resp SpO2  03/19/24 1002 (!) 154/71 98.2 F (36.8 C) Oral 78 19 97 %  03/19/24 0645 -- 98.3 F (36.8 C) Oral 78 20 94 %  03/19/24 0640 (!) 156/94 -- -- 80 18 94 %  03/19/24 0637 -- (P) 98.3 F (36.8 C) (P) Oral -- -- --   General: Alert, no acute distress Cardiovascular: Extremities are warm Respiratory: No cyanosis, no use of accessory musculature Skin: No lesions in the area of chief complaint  Neurologic: Sensation intact distally  Psychiatric: Patient is competent for consent with normal mood and affect Lymphatic: No swelling obvious and reported other than the area involved in the exam below Extremities  RLE: Extremity held in a fixed position.  ROM deferred due to known fracture.  Sensation is intact distally in the sural, saphenous, DP, SP, and plantar nerve distribution. 2+ DP pulse.  Toes are WWP.  Active motion intact in the TA/EHL/GS.  Recent  surgical incision is clean, dry and intact. LLE: Sensation is intact distally in the sural, saphenous, DP, SP, and plantar nerve distribution. 2+ DP pulse.  Toes are WWP.  Active motion intact in the TA/EHL/GS. Tolerates gentle ROM of the hip.  No pain with axial loading.     Test Results Imaging  XR of the Right hip demonstrates a subtrochanteric femur fracture, immediately below cannulated screws.  The screws are not backing out.  Fracture of the femoral neck remains nondisplaced.  Hardware is intact.  Labs cbc Recent Labs    03/19/24 0839  WBC 10.6*  HGB 11.6*  HCT 32.6*  PLT 393     Recent Labs    03/19/24 0839  NA 122*  K 3.2*  CL 86*  CO2 22  GLUCOSE 128*  BUN 6*  CREATININE 0.75  CALCIUM  8.8*

## 2024-03-19 NOTE — Anesthesia Preprocedure Evaluation (Signed)
 Anesthesia Evaluation  Patient identified by MRN, date of birth, ID band Patient awake    Reviewed: Allergy & Precautions, H&P , NPO status , Patient's Chart, lab work & pertinent test results, reviewed documented beta blocker date and time   Airway Mallampati: I  TM Distance: >3 FB Neck ROM: full    Dental  (+) Edentulous Upper, Edentulous Lower   Pulmonary asthma , pneumonia, former smoker Chronic bronchitis.  Quit smoking over a year ago.  Breathes pretty well now.   Pulmonary exam normal breath sounds clear to auscultation       Cardiovascular Exercise Tolerance: Poor hypertension, + CAD  Normal cardiovascular exam Rhythm:regular Rate:Normal  Remote 2 vessel nonobstructive CAD but has had no symptoms since.   Neuro/Psych  Headaches History ruptured cervical disc  negative psych ROS   GI/Hepatic negative GI ROS, Neg liver ROS,GERD  ,,  Endo/Other  negative endocrine ROS    Renal/GU negative Renal ROS  negative genitourinary   Musculoskeletal   Abdominal   Peds  Hematology negative hematology ROS (+)   Anesthesia Other Findings   Reproductive/Obstetrics negative OB ROS                              Anesthesia Physical Anesthesia Plan  ASA: 3 and emergent  Anesthesia Plan: General   Post-op Pain Management: Dilaudid  IV   Induction: Intravenous  PONV Risk Score and Plan: Ondansetron   Airway Management Planned: Oral ETT  Additional Equipment: None  Intra-op Plan:   Post-operative Plan: Extubation in OR  Informed Consent: I have reviewed the patients History and Physical, chart, labs and discussed the procedure including the risks, benefits and alternatives for the proposed anesthesia with the patient or authorized representative who has indicated his/her understanding and acceptance.     Dental Advisory Given  Plan Discussed with: CRNA  Anesthesia Plan Comments:          Anesthesia Quick Evaluation

## 2024-03-19 NOTE — Anesthesia Procedure Notes (Addendum)
 Procedure Name: Intubation Date/Time: 03/19/2024 1:57 PM  Performed by: Sherwin Donate, CRNAPre-anesthesia Checklist: Patient identified, Emergency Drugs available, Suction available and Patient being monitored Patient Re-evaluated:Patient Re-evaluated prior to induction Oxygen Delivery Method: Circle system utilized Preoxygenation: Pre-oxygenation with 100% oxygen Induction Type: IV induction, Rapid sequence and Cricoid Pressure applied Laryngoscope Size: 3 and Miller Grade View: Grade I Tube type: Oral Tube size: 7.0 mm Number of attempts: 1 Airway Equipment and Method: Stylet Placement Confirmation: ETT inserted through vocal cords under direct vision, positive ETCO2 and breath sounds checked- equal and bilateral Secured at: 22 cm Tube secured with: Tape Dental Injury: Teeth and Oropharynx as per pre-operative assessment  Comments: RSI planned due to C/O nausea in Pre-op.  Vomited on transport to Pre-op.

## 2024-03-19 NOTE — Plan of Care (Signed)
   Problem: Education: Goal: Knowledge of General Education information will improve Description: Including pain rating scale, medication(s)/side effects and non-pharmacologic comfort measures Outcome: Progressing   Problem: Nutrition: Goal: Adequate nutrition will be maintained Outcome: Progressing   Problem: Coping: Goal: Level of anxiety will decrease Outcome: Progressing

## 2024-03-19 NOTE — Progress Notes (Signed)
   03/19/24 2009  TOC Brief Assessment  Insurance and Status Reviewed  Patient has primary care physician Yes  Home environment has been reviewed From home c/husband  Prior level of function: Assisted  Prior/Current Home Services Current home services  Social Drivers of Health Review SDOH reviewed no interventions necessary  Readmission risk has been reviewed Yes  Transition of care needs transition of care needs identified, TOC will continue to follow   Pt from home and active c/[facility] from previous hip surgery earlier this month. Lennart Quitter has already contacted pt about resumption of care. Pt has a walker, bsc and transportation home. TOC to follow.

## 2024-03-19 NOTE — H&P (Signed)
 History and Physical    [name] EAV:[id number] DOB: 1956-09-30 DOA: 03/19/2024  PCP: Brantley Caldwell, NP   Patient coming from: Home  Chief Complaint: Sudden onset right hip pain  HPI: [name] is a 68 y.o. female with medical history significant for CAD, hypertension, dyslipidemia, and recent right hip fracture repair with discharge on 4/6 who presents back to the ED with sudden onset right hip pain that occurred as she was trying to go to her bathroom.  She nearly fell, but states that her husband was nearby and was able to catch her.  She denies any other injury.  She denies any other symptoms of nausea, vomiting, or abdominal pain.  No lightheadedness or dizziness noted.  She has been eating and drinking as usual and continues to take her home medications as well.   ED Course: Vital signs stable and patient afebrile.  Leukocytosis of 10,600 noted and hemoglobin 11.6.  Sodium 122 and potassium 3.2.  Patient started on 1 L normal saline bolus.  Pelvic and femur x-rays demonstrating proximal femur fracture at the level of the lesser trochanter.  EDP has discussed case with orthopedics Dr. Phyllis Breeze.  Review of Systems: Reviewed as noted above, otherwise negative.  Past Medical History:  Diagnosis Date   Coronary artery disease    Endometriosis    Hyperlipidemia    Hypertension    Hypotension    Ruptured cervical disc    Skin cancer     Past Surgical History:  Procedure Laterality Date   ABDOMINAL HYSTERECTOMY     ANTERIOR APPROACH HEMI HIP ARTHROPLASTY Left 05/29/2016   Procedure: ANTERIOR APPROACH TOTAL HIP ARTHROPLASTY ;  Surgeon: Arnie Lao, MD;  Location: [facility];  Service: Orthopedics;  Laterality: Left;   ARTERY BIOPSY Right 05/03/2019   Procedure: MINOR BIOPSY TEMPORAL ARTERY;  Surgeon: Awilda Bogus, MD;  Location: [facility];  Service: General;  Laterality: Right;   CARDIAC CATHETERIZATION     CATARACT EXTRACTION W/PHACO Right 06/18/2022    Procedure: CATARACT EXTRACTION PHACO AND INTRAOCULAR LENS PLACEMENT (IOC);  Surgeon: Tarri Farm, MD;  Location: [facility];  Service: Ophthalmology;  Laterality: Right;  CDE 115.90   CATARACT EXTRACTION W/PHACO Left 07/02/2022   Procedure: CATARACT EXTRACTION PHACO AND INTRAOCULAR LENS PLACEMENT (IOC);  Surgeon: Tarri Farm, MD;  Location: [facility];  Service: Ophthalmology;  Laterality: Left;  CDE: 5.85   CHOLECYSTECTOMY     HIP PINNING,CANNULATED Right 03/03/2024   Procedure: FIXATION, FEMUR, NECK, PERCUTANEOUS, USING SCREW;  Surgeon: Tonita Frater, MD;  Location: [facility];  Service: Orthopedics;  Laterality: Right;   TONSILLECTOMY       reports that she quit smoking about 4 years ago. Her smoking use included cigarettes. She has never used smokeless tobacco. She reports current alcohol use. She reports that she does not currently use drugs.  Allergies  Allergen Reactions   Cortisone Other (See Comments)    Fluid retention and hives    Family History  Problem Relation Age of Onset   Cancer Mother    Cancer Father     Prior to Admission medications   Medication Sig Start Date End Date Taking? Authorizing Provider  albuterol  (PROVENTIL ) (2.5 MG/3ML) 0.083% nebulizer solution Take 2.5 mg by nebulization 3 (three) times daily as needed for wheezing or shortness of breath. 01/04/24   [provider]  albuterol  (VENTOLIN  HFA) 108 (90 Base) MCG/ACT inhaler Inhale 1-2 puffs into the lungs every 6 (six) hours as needed for wheezing  or shortness of breath. 02/11/24   [provider]  amLODipine  (NORVASC ) 5 MG tablet Take 1 tablet (5 mg total) by mouth daily. 01/09/20   Luane Rumps, PA-C  aspirin  81 MG chewable tablet Chew 1 tablet (81 mg total) by mouth 2 (two) times daily. 03/04/24 04/03/24  Mason Sole, Diesha Rostad D, DO  atorvastatin  (LIPITOR) 20 MG tablet Take 1 tablet (20 mg total) by mouth daily. 03/04/24   Mason Sole, Devany Aja D, DO  atorvastatin  (LIPITOR) 40 MG tablet Take 40 mg by mouth daily.  01/04/24   [provider]  benzonatate  (TESSALON ) 100 MG capsule Take 100 mg by mouth daily. 02/14/24   [provider]  cyanocobalamin (VITAMIN B12) 500 MCG tablet Take 500 mcg by mouth daily.    [provider]  ezetimibe (ZETIA) 10 MG tablet Take 10 mg by mouth daily. 02/11/24   [provider]  fluticasone  (FLONASE ) 50 MCG/ACT nasal spray Place 1-2 sprays into both nostrils daily. 01/24/24   [provider]  hydrochlorothiazide (HYDRODIURIL) 12.5 MG tablet Take 12.5 mg by mouth daily. 01/04/24   [provider]  levothyroxine  (SYNTHROID ) 25 MCG tablet Take 25 mcg by mouth every morning. 12/30/23   [provider]  lisinopril  (ZESTRIL ) 20 MG tablet Take 1 tablet (20 mg total) by mouth daily. 01/09/20   Luane Rumps, PA-C  montelukast  (SINGULAIR ) 10 MG tablet Take 10 mg by mouth daily. 01/24/24   [provider]  niacin (VITAMIN B3) 500 MG tablet Take 500 mg by mouth at bedtime.    [provider]  Omega-3 Fatty Acids (FISH OIL) 1000 MG CAPS Take 1 capsule by mouth daily.    [provider]  oxyCODONE  (ROXICODONE ) 5 MG immediate release tablet Take 1 tablet (5 mg total) by mouth every 8 (eight) hours as needed for breakthrough pain or severe pain (pain score 7-10). 03/04/24 03/04/25  Doreene Gammon D, DO    Physical Exam: Vitals:   03/19/24 0637 03/19/24 0640 03/19/24 0645  BP:  (!) 156/94   Pulse:  80 78  Resp:  18 20  Temp: (P) 98.3 F (36.8 C)  98.3 F (36.8 C)  TempSrc: (P) Oral  Oral  SpO2:  94% 94%    Constitutional: NAD, calm, comfortable Vitals:   03/19/24 0637 03/19/24 0640 03/19/24 0645  BP:  (!) 156/94   Pulse:  80 78  Resp:  18 20  Temp: (P) 98.3 F (36.8 C)  98.3 F (36.8 C)  TempSrc: (P) Oral  Oral  SpO2:  94% 94%   Eyes: lids and conjunctivae normal Neck: normal, supple Respiratory: clear to auscultation bilaterally. Normal respiratory effort. No accessory muscle use.   Cardiovascular: Regular rate and rhythm, no murmurs. Abdomen: no tenderness, no distention. Bowel sounds positive.  Musculoskeletal:  No edema. Skin: no rashes, lesions, ulcers.  Psychiatric: Flat affect  Labs on Admission: I have personally reviewed following labs and imaging studies  CBC: Recent Labs  Lab 03/19/24 0839  WBC 10.6*  NEUTROABS 9.1*  HGB 11.6*  HCT 32.6*  MCV 92.1  PLT 393   Basic Metabolic Panel: Recent Labs  Lab 03/19/24 0839  NA 122*  K 3.2*  CL 86*  CO2 22  GLUCOSE 128*  BUN 6*  CREATININE 0.75  CALCIUM  8.8*   GFR: CrCl cannot be calculated (Unknown ideal weight.). Liver Function Tests: No results for input(s): "AST", "ALT", "ALKPHOS", "BILITOT", "PROT", "ALBUMIN" in the last 168 hours. No results for input(s): "LIPASE", "AMYLASE" in the last 168  hours. No results for input(s): "AMMONIA" in the last 168 hours. Coagulation Profile: No results for input(s): "INR", "PROTIME" in the last 168 hours. Cardiac Enzymes: No results for input(s): "CKTOTAL", "CKMB", "CKMBINDEX", "TROPONINI" in the last 168 hours. BNP (last 3 results) No results for input(s): "PROBNP" in the last 8760 hours. HbA1C: No results for input(s): "HGBA1C" in the last 72 hours. CBG: No results for input(s): "GLUCAP" in the last 168 hours. Lipid Profile: No results for input(s): "CHOL", "HDL", "LDLCALC", "TRIG", "CHOLHDL", "LDLDIRECT" in the last 72 hours. Thyroid Function Tests: No results for input(s): "TSH", "T4TOTAL", "FREET4", "T3FREE", "THYROIDAB" in the last 72 hours. Anemia Panel: No results for input(s): "VITAMINB12", "FOLATE", "FERRITIN", "TIBC", "IRON", "RETICCTPCT" in the last 72 hours. Urine analysis:    Component Value Date/Time   COLORURINE AMBER (A) 05/01/2019 1534   APPEARANCEUR CLOUDY (A) 05/01/2019 1534   LABSPEC 1.021 05/01/2019 1534   PHURINE 5.0 05/01/2019 1534   GLUCOSEU NEGATIVE 05/01/2019 1534   HGBUR SMALL (A) 05/01/2019 1534   BILIRUBINUR  NEGATIVE 05/01/2019 1534   KETONESUR NEGATIVE 05/01/2019 1534   PROTEINUR 100 (A) 05/01/2019 1534   NITRITE NEGATIVE 05/01/2019 1534   LEUKOCYTESUR NEGATIVE 05/01/2019 1534    Radiological Exams on Admission: DG Femur Min 2 Views Right Result Date: 03/19/2024 CLINICAL DATA:  Status post fall. EXAM: RIGHT FEMUR 2 VIEWS COMPARISON:  03/03/2024 FINDINGS: Three screws from previous ORIF of right femoral neck fracture are again noted. There is a new, acute fracture involving the proximal right femur at the level of the lesser trochanter. Slight impaction and medial angulation of the distal fracture fragments noted. The distal right femur appears intact. IMPRESSION: Acute fracture involving the proximal right femur at the level of the lesser trochanter. Electronically Signed   By: Kimberley Penman M.D.   On: 03/19/2024 07:20   DG Hip Unilat W or Wo Pelvis 2-3 Views Right Result Date: 03/19/2024 CLINICAL DATA:  Hip pain EXAM: DG HIP (WITH OR WITHOUT PELVIS) 2-3V RIGHT COMPARISON:  03/03/2024 FINDINGS: There are 3 cannulated screws are noted traversing the right femoral neck fracture. There is a new, acute fracture involving the proximal right femur just below the level of the lesser trochanter. Medial angulation of the distal fracture fragments identified. Status post left total hip arthroplasty. IMPRESSION: Acute fracture involving the proximal right femur just below the level of the lesser trochanter. Electronically Signed   By: Kimberley Penman M.D.   On: 03/19/2024 07:19    Assessment/Plan Principal Problem:   Hip fracture (HCC) Active Problems:   Hyperlipidemia   TOBACCO ABUSE   Coronary atherosclerosis   GERD   Hypertension   Closed right hip fracture (HCC)   Hyponatremia    Acute right hip fracture-spontaneous - Recent discharge on 4/6 for hip fracture to right side noted then as well - She was discharged to home at that time with home health PT/OT - Keep n.p.o. except sips with  medications - Pain management - Appreciate orthopedics evaluation and management  Severe hyponatremia - Appears to be asymptomatic from this - Likely related to HCTZ use - Plan to check serum and urine osmolarity as well as TSH levels - Continue normal saline and monitor repeat labs  CAD/hypertension - Continue antihypertensives with the exception of hydrochlorothiazide  Hypothyroidism - Check TSH - Continue levothyroxine   History of tobacco abuse - Counseled on cessation   DVT prophylaxis: SCDs Code Status: Full Family Communication: None at bedside Disposition Plan: Admit for hip fracture Consults called: Orthopedics  Admission status: Inpatient, telemetry  Severity of Illness: The appropriate patient status for this patient is INPATIENT. Inpatient status is judged to be reasonable and necessary in order to provide the required intensity of service to ensure the patient's safety. The patient's presenting symptoms, physical exam findings, and initial radiographic and laboratory data in the context of their chronic comorbidities is felt to place them at high risk for further clinical deterioration. Furthermore, it is not anticipated that the patient will be medically stable for discharge from the hospital within 2 midnights of admission.   * I certify that at the point of admission it is my clinical judgment that the patient will require inpatient hospital care spanning beyond 2 midnights from the point of admission due to high intensity of service, high risk for further deterioration and high frequency of surveillance required.*   Miyah Hampshire D Birtha Hatler DO [facility]  If 7PM-7AM, please contact night-coverage [email or web address]  03/19/2024, 9:30 AM

## 2024-03-19 NOTE — Op Note (Signed)
 Orthopaedic Surgery Operative Note (CSN: [id number])  [name]  [birth year] Date of Surgery: 03/19/2024   Diagnoses:  Right subtrochanteric femur fracture  Procedure: Cephalomedullary nail for Right subtrochanteric femur fracture Removal of 3 cannulated screws with washers   Operative Finding Successful completion of the planned procedure.  Removal of 3 cannulated screws with washers for a recent CRPP of a femoral neck fracture.  Femoral neck fracture was stabilized with a pin, and a cephalomedullary nail was inserted to secure the subtrochanteric femur fracture.   Post-Op Diagnosis: Same Surgeons:Primary: Tonita Frater, MD Assisting: Darrin Emerald, MD Assistants: Elsa Halls, MD -assistance was needed throughout the case for positioning, retraction and appropriate positioning of the hardware Location: [facility] OR ROOM 3 Anesthesia: General with local anesthesia Antibiotics: Ancef  2 g Tourniquet time: N/A Estimated Blood Loss: 250 cc Complications: None Specimens: None  Implants: Implant Name Type Inv. Item Serial No. Manufacturer Lot No. LRB No. Used Action  NAIL RIGHT [id number] TR[employer]ANTE - [id number] Nail NAIL RIGHT [id number] [employer] [id number] Right 1 Implanted  SCREW TELESCOPING LAG 10.5X90 - [id number] Screw SCREW TELESCOPING LAG 10.5X90  [employer] [id number] Right 1 Implanted  SCREW CORT CAPT FT 5.0X38 - [id number] Screw SCREW CORT CAPT FT 5.0X38  [employer] [id number] Right 1 Implanted    Indications for Surgery:   [name] is a 68 y.o. female who recently had a fall, and sustained a nondisplaced fracture of the right femoral neck.  She underwent CRPP of the right femoral neck fracture, 2-3 weeks ago.  Her immediate postoperative course was uneventful.  She was discharged home.  She is walking well with a walker.  Unfortunately, earlier today, she stumbled, and twisted her right hip.  She had immediate pain.  She did not fall.  Nonetheless, she  sustained a Right subtrochanteric femur fracture.  Given the unstable nature of the injury, I recommended operative fixation to restore stability and allow the patient to ambulate immediately postop.  In addition to fixation of the current fracture, we plan to stabilize the femoral neck fracture with a cephalomedullary lag screw.  We also have to consider removal of the existing hardware.  Benefits and risks of operative and nonoperative management were discussed prior to surgery with the patient and informed consent form was completed.  Specific risks including infection, need for additional surgery, persistent pain, bleeding, malunion, nonunion, blood clots and more severe complications associated with anesthesia.  The patient elected to proceed and surgical consent was obtained.    Procedure:   The patient was identified properly. Informed consent was obtained and the surgical site was marked. The patient was taken to the OR where general anesthesia was induced.  The patient was placed supine on a fracture table and appropriate reduction was obtained and visualized on fluoroscopy prior to the beginning of the procedure.  Timeout was performed before the beginning of the case.  Ancef  2 g dosing was confirmed prior to making incision.  The patient received TXA prior to the start of surgery.   The existing incision was identified.  We incised through skin, then bluntly dissected through the subcutaneous tissue until the IT band was visualized.  Previous sutures were removed.  At this point, the lateral aspect of the proximal femur was visible.  We started by identifying the most inferior screw.  A guidewire pin was introduced into the cannulated screw.  This was carefully advanced in retrograde fashion.  Fluoroscopy confirmed the positioning of the pin.  We then remove the cannulated screw.  Next, we proceeded to find the most superior and anterior screw head.  A guidewire was introduced in retrograde fashion.   This was secured within good bone within the femoral head, with the intent of leaving this in place to secure the femoral neck fracture.  This cannulated screw was removed.  The same procedure was then completed in order to remove the superior and posterior screw.  All 3 washers were removed.  The femoral neck fracture was critically evaluated.  It was determined that there was no motion, or displacement.  A single guidewire pin remained in place, to prevent displacement or rotation through the femoral neck fracture.  At this point, we turned our attention to the subtrochanteric femur fracture.  We confirmed that we had a reasonable reduction under fluoroscopy.  We made an incision proximal to the greater trochanter and dissected down through the fascia.  We then used an awl in the greater trochanter, in order to gain access to the femoral canal.  Once satisfied with the starting point, the entry reamer was used to gain entry into the intramedullary canal.  A ball tip guidewire was then introduced.  At this point, we paid careful attention to the fracture.  A bone hook was placed over the anterior cortex, and a Cobb was placed in the posterior cortex, in order to achieve adequate reduction.  We were then able to pass the ball-tipped guidewire across the fracture and into the distal femur to an appropriate level at the physeal scar of the distal femur and measurement was obtained proximally using fluoroscopy.  We selected the appropriate length of nail, as noted above.  Entry reamer was used.  The decision to ream, in order to allow for easier passage of the nail, and to reduce the possibility of displaced and the femoral neck fracture.  We started with an 8 reamer, and advanced to an 11.  There was minimal chatter, but once again this allowed for easier passage of our nail.  Using reduction aids, we were able to maintain the overall reduction of the subtrochanteric femur fracture.  At this point we placed our  nail localizing under fluoroscopy, and confirmed that it was at the appropriate level.  Given the level of the fracture, and the existing incision, we were able to visualize the fracture well.  Under direct visualization, the fracture was manipulated using a large reduction clamp.  In addition, we manipulated the traction, as well as the rotation of the operative leg, in order to achieve an appropriate reduction.  Once again, we confirmed that the nail was at an appropriate level.   Next we used the outrigger device to pass a wire into the femoral neck, and then the cephalomedullary lag screw.  The screw was locked proximally to avoid over collapse.  We then turned our attention to the distal interlocking screw.  Once again, we used the outrigger device to place a single interlocking screw in the midshaft area.  We removed the proximal guidepin into the femoral head, as well as a reduction clamp at the level of the subtrochanteric femur fracture.  The outrigger device was removed and final fluoroscopic images were obtained.  The wounds were thoroughly irrigated closed in a multilayer fashion with 0 strata fix, 2-0 monocryl and staples.  Sterile dressings were placed.  The patient was awoken from general anesthesia and taken to the PACU in stable condition without complication.     Post-operative plan:  Weightbearing: The patient will be WBAT on the operative extremity.   DVT prophylaxis per primary team, no orthopedic contraindications.  Recommend 81 mg Aspirin  BID, unless patient cannot tolerate or was previously on anticoagulation.  Prefer to start Ppx POD#1 Pain control with PRN pain medication preferring oral medicines.   Dressing can be reinforced as needed, will change on POD#2/3 if needed.  Patient does not need to remain hospitalized for dressing change Follow up plan: approximately 2 weeks postop for incision check and XR.  If the patient will be returning to a nursing facility, staples can be  removed around this time and a follow up appointment can be scheduled for 6 weeks after surgery. XR at next visit:  please obtain [facility] pelvis, and 2 views of the Right femur

## 2024-03-19 NOTE — ED Triage Notes (Signed)
 Pt bib [facility] from home c/o right hip pain, had hip surgery on 03/03/24.

## 2024-03-19 NOTE — ED Provider Notes (Signed)
 Care of the patient assumed at signout.  Patient awake, alert.  She is aware of x-ray which I have reviewed.  I discussed with Dr. Phyllis Breeze who will discuss with the patient's primary orthopedic surgeon.  Patient will be admitted to the hospitalist team for anticipated surgical repair.   Dorenda Gandy, MD 03/19/24 [phone]

## 2024-03-19 NOTE — ED Provider Notes (Signed)
 Patient is admission labs notable for sodium 122, IV fluids are running.  She remains in no distress.   Dorenda Gandy, MD 03/19/24 [phone]

## 2024-03-19 NOTE — ED Provider Notes (Signed)
 [place] EMERGENCY DEPARTMENT AT [facility] Provider Note   CSN: [id number] Arrival date & time: 03/19/24  4098     History  Chief Complaint  Patient presents with   Hip Pain    [name] is a 68 y.o. female.  Presents to the emergency department by ambulance from home.  Patient reports that she suffered a hip fracture 2 weeks ago and underwent surgery.  Patient was at home tonight, had a near fall.  She was able to catch herself but felt immediate pain in the area of the right hip.  She reports that the pain is in the groin area and goes straight through.  She took an oxycodone  and drink a couple of beers but it did not help the pain.       Home Medications Prior to Admission medications   Medication Sig Start Date End Date Taking? Authorizing Provider  albuterol  (PROVENTIL ) (2.5 MG/3ML) 0.083% nebulizer solution Take 2.5 mg by nebulization 3 (three) times daily as needed for wheezing or shortness of breath. 01/04/24   [provider]  albuterol  (VENTOLIN  HFA) 108 (90 Base) MCG/ACT inhaler Inhale 1-2 puffs into the lungs every 6 (six) hours as needed for wheezing or shortness of breath. 02/11/24   [provider]  amLODipine  (NORVASC ) 5 MG tablet Take 1 tablet (5 mg total) by mouth daily. 01/09/20   Luane Rumps, PA-C  aspirin  81 MG chewable tablet Chew 1 tablet (81 mg total) by mouth 2 (two) times daily. 03/04/24 04/03/24  Mason Sole, Pratik D, DO  atorvastatin  (LIPITOR) 20 MG tablet Take 1 tablet (20 mg total) by mouth daily. 03/04/24   Mason Sole, Pratik D, DO  atorvastatin  (LIPITOR) 40 MG tablet Take 40 mg by mouth daily. 01/04/24   [provider]  benzonatate  (TESSALON ) 100 MG capsule Take 100 mg by mouth daily. 02/14/24   [provider]  cyanocobalamin (VITAMIN B12) 500 MCG tablet Take 500 mcg by mouth daily.    [provider]  ezetimibe (ZETIA) 10 MG tablet Take 10 mg by mouth daily. 02/11/24   [provider]  fluticasone   (FLONASE ) 50 MCG/ACT nasal spray Place 1-2 sprays into both nostrils daily. 01/24/24   [provider]  hydrochlorothiazide (HYDRODIURIL) 12.5 MG tablet Take 12.5 mg by mouth daily. 01/04/24   [provider]  levothyroxine  (SYNTHROID ) 25 MCG tablet Take 25 mcg by mouth every morning. 12/30/23   [provider]  lisinopril  (ZESTRIL ) 20 MG tablet Take 1 tablet (20 mg total) by mouth daily. 01/09/20   Luane Rumps, PA-C  montelukast  (SINGULAIR ) 10 MG tablet Take 10 mg by mouth daily. 01/24/24   [provider]  niacin (VITAMIN B3) 500 MG tablet Take 500 mg by mouth at bedtime.    [provider]  Omega-3 Fatty Acids (FISH OIL) 1000 MG CAPS Take 1 capsule by mouth daily.    [provider]  oxyCODONE  (ROXICODONE ) 5 MG immediate release tablet Take 1 tablet (5 mg total) by mouth every 8 (eight) hours as needed for breakthrough pain or severe pain (pain score 7-10). 03/04/24 03/04/25  Doreene Gammon D, DO      Allergies    Cortisone    Review of Systems   Review of Systems  Physical Exam Updated Vital Signs BP (!) 156/94   Pulse 78   Temp 98.3 F (36.8 C) (Oral)   Resp 20   SpO2 94%  Physical Exam Vitals and nursing note reviewed.  Constitutional:  General: She is not in acute distress.    Appearance: She is well-developed.  HENT:     Head: Normocephalic and atraumatic.     Mouth/Throat:     Mouth: Mucous membranes are moist.  Eyes:     General: Vision grossly intact. Gaze aligned appropriately.     Extraocular Movements: Extraocular movements intact.     Conjunctiva/sclera: Conjunctivae normal.  Cardiovascular:     Rate and Rhythm: Normal rate and regular rhythm.     Pulses: Normal pulses.     Heart sounds: Normal heart sounds, S1 normal and S2 normal. No murmur heard.    No friction rub. No gallop.  Pulmonary:     Effort: Pulmonary effort is normal. No respiratory distress.     Breath sounds: Normal breath sounds.  Abdominal:      General: Bowel sounds are normal.     Palpations: Abdomen is soft.     Tenderness: There is no abdominal tenderness. There is no guarding or rebound.     Hernia: No hernia is present.  Musculoskeletal:        General: No swelling.     Cervical back: Full passive range of motion without pain, normal range of motion and neck supple. No spinous process tenderness or muscular tenderness. Normal range of motion.     Right hip: Tenderness present. Decreased range of motion.     Right lower leg: No edema.     Left lower leg: No edema.  Skin:    General: Skin is warm and dry.     Capillary Refill: Capillary refill takes less than 2 seconds.     Findings: No ecchymosis, erythema, rash or wound.  Neurological:     General: No focal deficit present.     Mental Status: She is alert and oriented to person, place, and time.     GCS: GCS eye subscore is 4. GCS verbal subscore is 5. GCS motor subscore is 6.     Cranial Nerves: Cranial nerves 2-12 are intact.     Sensory: Sensation is intact.     Motor: Motor function is intact.     Coordination: Coordination is intact.  Psychiatric:        Attention and Perception: Attention normal.        Mood and Affect: Mood normal.        Speech: Speech normal.        Behavior: Behavior normal.     ED Results / Procedures / Treatments   Labs (all labs ordered are listed, but only abnormal results are displayed) Labs Reviewed - No data to display  EKG None  Radiology No results found.  Procedures Procedures    Medications Ordered in ED Medications - No data to display  ED Course/ Medical Decision Making/ A&P                                 Medical Decision Making Amount and/or Complexity of Data Reviewed Radiology: ordered.   Presents with sudden right hip pain after a near fall.  She did not fall to the ground.  No head injury.  Patient complaining of pain in the right inguinal and hip area.  There is severe pain with movement at the  hip.  Will obtain x-rays.  Case will be signed out to oncoming ER physician.        Final Clinical Impression(s) / ED Diagnoses Final diagnoses:  Acute pain of right hip    Rx / DC Orders ED Discharge Orders     None         Laqueshia Cihlar, Marine Sia, MD 03/19/24 [phone]

## 2024-03-20 ENCOUNTER — Encounter: Admitting: Orthopedic Surgery

## 2024-03-20 DIAGNOSIS — S72001A Fracture of unspecified part of neck of right femur, initial encounter for closed fracture: Secondary | ICD-10-CM | POA: Diagnosis not present

## 2024-03-20 LAB — CBC
HCT: 28.8 % — ABNORMAL LOW (ref 36.0–46.0)
Hemoglobin: 9.6 g/dL — ABNORMAL LOW (ref 12.0–15.0)
MCH: 32.3 pg (ref 26.0–34.0)
MCHC: 33.3 g/dL (ref 30.0–36.0)
MCV: 97 fL (ref 80.0–100.0)
Platelets: 332 10*3/uL (ref 150–400)
RBC: 2.97 MIL/uL — ABNORMAL LOW (ref 3.87–5.11)
RDW: 12 % (ref 11.5–15.5)
WBC: 7.6 10*3/uL (ref 4.0–10.5)
nRBC: 0 % (ref 0.0–0.2)

## 2024-03-20 LAB — BASIC METABOLIC PANEL WITH GFR
Anion gap: 10 (ref 5–15)
BUN: 8 mg/dL (ref 8–23)
CO2: 24 mmol/L (ref 22–32)
Calcium: 8.1 mg/dL — ABNORMAL LOW (ref 8.9–10.3)
Chloride: 98 mmol/L (ref 98–111)
Creatinine, Ser: 0.75 mg/dL (ref 0.44–1.00)
GFR, Estimated: >60 mL/min (ref >60)
Glucose, Bld: 108 mg/dL — ABNORMAL HIGH (ref 70–99)
Potassium: 4.3 mmol/L (ref 3.5–5.1)
Sodium: 132 mmol/L — ABNORMAL LOW (ref 135–145)

## 2024-03-20 LAB — MAGNESIUM: Magnesium: 1.7 mg/dL (ref 1.7–2.4)

## 2024-03-20 MED ORDER — CHLORHEXIDINE GLUCONATE CLOTH 2 % EX PADS
6.0000 | MEDICATED_PAD | Freq: Every day | CUTANEOUS | Status: DC
  Administered 2024-03-20: 6 via TOPICAL

## 2024-03-20 MED ORDER — ASPIRIN 81 MG PO TBEC
81.0000 mg | DELAYED_RELEASE_TABLET | Freq: Two times a day (BID) | ORAL | Status: DC
  Administered 2024-03-20 – 2024-03-21 (×3): 81 mg via ORAL
  Filled 2024-03-20 (×3): qty 1

## 2024-03-20 NOTE — NC FL2 (Signed)
 [place]  MEDICAID FL2 LEVEL OF CARE FORM     IDENTIFICATION  Patient Name: [name] Birthdate: December 24, 1955 Sex: female Admission Date (Current Location): 03/19/2024  [facility] and [place] Number:  [employer] and Address:  [facility],  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   Cataract 05/16/2019   Screening for breast cancer 05/16/2019   CAP (community acquired pneumonia) 05/02/2019   Lobar pneumonia (HCC) 05/02/2019   AKI (acute kidney injury) (HCC)    Headache around the eyes    Hypertension 05/28/2016   Hypokalemia 05/28/2016   Closed left hip fracture (HCC) 05/28/2016   CARCINOMA, SKIN, SQUAMOUS CELL 06/05/2009   RESTLESS LEG SYNDROME 06/06/2007   GERD 01/27/2007   Hyperlipidemia 10/19/2006   TOBACCO ABUSE 10/19/2006   Coronary atherosclerosis 10/19/2006   BRONCHITIS, CHRONIC NOS 10/19/2006   Asthma 10/19/2006   LOW BACK PAIN 10/19/2006   HYPERGLYCEMIA 10/19/2006    Orientation RESPIRATION BLADDER Height & Weight     Self, Time, Situation, Place  Normal Continent Weight:   Height:     BEHAVIORAL SYMPTOMS/MOOD NEUROLOGICAL BOWEL NUTRITION STATUS      Continent Diet (See d/c summary)  AMBULATORY STATUS COMMUNICATION OF NEEDS Skin   Limited Assist Verbally Surgical wounds, Bruising                       Personal Care Assistance Level of  Assistance  Bathing, Feeding, Dressing Bathing Assistance: Limited assistance Feeding assistance: Limited assistance Dressing Assistance: Limited assistance     Functional Limitations Info  Sight, Hearing, Speech Sight Info: Adequate Hearing Info: Adequate Speech Info: Adequate    SPECIAL CARE FACTORS FREQUENCY  PT (By licensed PT)     PT Frequency: 5x weekly              Contractures      Additional Factors Info  Code Status, Allergies Code Status Info: Full code Allergies Info: Cortisone           Current Medications (03/20/2024):  This is the current hospital active medication list Current Facility-Administered Medications  Medication Dose Route Frequency Provider Last Rate Last Admin   acetaminophen  (TYLENOL ) tablet 650 mg  650 mg Oral Q6H PRN Tonita Frater, MD   650 mg at 03/20/24 5621   aspirin  EC tablet 81 mg  81 mg Oral BID Shah, Pratik D, DO   81 mg at 03/20/24 0941   ceFAZolin  (ANCEF ) IVPB 2g/100 mL premix  2 g Intravenous Q8H Tonita Frater, MD 200 mL/hr at 03/20/24 0528 2 g at 03/20/24 0528   Chlorhexidine  Gluconate Cloth 2 % PADS 6 each  6 each Topical Q0600 Shah, Pratik D, DO   6 each at 03/20/24 1147   HYDROmorphone  (DILAUDID ) injection 0.5 mg  0.5 mg Intravenous Q4H PRN Tonita Frater, MD   0.5 mg  at 03/20/24 1309   levothyroxine  (SYNTHROID ) tablet 25 mcg  25 mcg Oral Q0600 Tonita Frater, MD   25 mcg at 03/20/24 3244   ondansetron  (ZOFRAN ) tablet 4 mg  4 mg Oral Q6H PRN Tonita Frater, MD       Or   ondansetron  (ZOFRAN ) injection 4 mg  4 mg Intravenous Q6H PRN Tonita Frater, MD   4 mg at 03/19/24 1746     Discharge Medications: Please see discharge summary for a list of discharge medications.  Relevant Imaging Results:  Relevant Lab Results:   Additional Information SSN: [id number]  Ander Katos, LCSW

## 2024-03-20 NOTE — Evaluation (Signed)
 Occupational Therapy Evaluation Patient Details Name: [name] MRN: [id number] DOB: 11-Feb-1956 Today's Date: 03/20/2024   History of Present Illness   [name] is a 68 y.o. female with medical history significant for CAD, hypertension, dyslipidemia, and recent right hip fracture repair with discharge on 4/6 who presents back to the ED with sudden onset right hip pain that occurred as she was trying to go to her bathroom.  She nearly fell, but states that her husband was nearby and was able to catch her.  She denies any other injury.  She denies any other symptoms of nausea, vomiting, or abdominal pain.  No lightheadedness or dizziness noted.  She has been eating and drinking as usual and continues to take her home medications as well. (per DO)     Clinical Impressions Pt agreeable to OT and PT co-evaluation. Pt reports living at home with her husband who can be present 24/7. Pt reports husband has a history of seizures. Pt is requiring CGA for short distance ambulation with RW. Pt able to completed bed mobility with mod I level of assist due to time and effort. Good B  UE strength and functional use. Min to mod A for lower body ADL's for safety due to recent procedure. Pt is a high fall risk at this time. Pt left in the chair with call bell within reach and chair alarm set. Pt will benefit from continued OT in the hospital and recommended venue below to increase strength, balance, and endurance for safe ADL's.        If plan is discharge home, recommend the following:   A little help with walking and/or transfers;A little help with bathing/dressing/bathroom;Assist for transportation;Assistance with cooking/housework;Help with stairs or ramp for entrance     Functional Status Assessment   Patient has had a recent decline in their functional status and demonstrates the ability to make significant improvements in function in a reasonable and predictable amount of time.     Equipment  Recommendations   None recommended by OT             Precautions/Restrictions   Precautions Precautions: Fall Recall of Precautions/Restrictions: Intact Restrictions Weight Bearing Restrictions Per Provider Order: No RLE Weight Bearing Per Provider Order: Weight bearing as tolerated     Mobility Bed Mobility Overal bed mobility: Modified Independent             General bed mobility comments: Mild labored movement; using B UE to move R UE to EOB.    Transfers Overall transfer level: Needs assistance Equipment used: Rolling walker (2 wheels) Transfers: Sit to/from Stand, Bed to chair/wheelchair/BSC Sit to Stand: Contact guard assist     Step pivot transfers: Contact guard assist     General transfer comment: CGA for safety; labored movement; extended time and planning by pt for sit to stand from EOB and chair.      Balance Overall balance assessment: Needs assistance Sitting-balance support: No upper extremity supported, Feet supported Sitting balance-Leahy Scale: Good Sitting balance - Comments: seated at EOB   Standing balance support: Bilateral upper extremity supported, During functional activity, Reliant on assistive device for balance Standing balance-Leahy Scale: Fair Standing balance comment: using RW                           ADL either performed or assessed with clinical judgement   ADL Overall ADL's : Needs assistance/impaired     Grooming: Set up;Sitting  Upper Body Bathing: Set up;Sitting   Lower Body Bathing: Moderate assistance;Sitting/lateral leans;Minimal assistance   Upper Body Dressing : Set up;Sitting   Lower Body Dressing: Minimal assistance;Moderate assistance;Sitting/lateral leans Lower Body Dressing Details (indicate cue type and reason): Assited to don R LE sock for safety given recent procedure. Able to don L LE sock independently seated at EOB. Toilet Transfer: Contact guard  assist;Stand-pivot;Ambulation;Rolling walker (2 wheels) Toilet Transfer Details (indicate cue type and reason): Simulated via EOB to chair and ambulation in the. Toileting- Clothing Manipulation and Hygiene: Moderate assistance;Minimal assistance;Sitting/lateral lean       Functional mobility during ADLs: Contact guard assist;Rolling walker (2 wheels) General ADL Comments: Able to ambulate to the door and back with RW.     Vision Baseline Vision/History: 1 Wears glasses Ability to See in Adequate Light: 1 Impaired Patient Visual Report: No change from baseline Vision Assessment?: Wears glasses for reading (has a film developing over eyes and is planning to see the eye doctor)     Perception Perception: Not tested       Praxis Praxis: Not tested       Pertinent Vitals/Pain Pain Assessment Pain Assessment: No/denies pain     Extremity/Trunk Assessment Upper Extremity Assessment Upper Extremity Assessment: Overall WFL for tasks assessed   Lower Extremity Assessment Lower Extremity Assessment: Defer to PT evaluation   Cervical / Trunk Assessment Cervical / Trunk Assessment: Normal   Communication Communication Communication: No apparent difficulties   Cognition Arousal: Alert Behavior During Therapy: WFL for tasks assessed/performed Cognition: No apparent impairments                               Following commands: Intact       Cueing  General Comments   Cueing Techniques: Verbal cues                 Home Living Family/patient expects to be discharged to:: Private residence Living Arrangements: Spouse/significant other Available Help at Discharge: Family Type of Home: Mobile home Home Access: Stairs to enter Secretary/administrator of Steps: 4 Entrance Stairs-Rails: Can reach both Home Layout: One level     Bathroom Shower/Tub: Estate manager/land agent Accessibility: Yes   Home Equipment: Agricultural consultant (2 wheels)   Additional  Comments: Pt reports same living history.      Prior Functioning/Environment Prior Level of Function : Needs assist       Physical Assist : Mobility (physical);ADLs (physical) Mobility (physical): Transfers ADLs (physical): Bathing Mobility Comments: Pt reports use of RW and supervision assist from hisband for shower transfer since being home. ADLs Comments: Pt reports independence with ADL's since being home from recent hospitalization.    OT Problem List: Decreased strength;Decreased activity tolerance;Impaired balance (sitting and/or standing)   OT Treatment/Interventions: Self-care/ADL training;Therapeutic exercise;DME and/or AE instruction;Therapeutic activities;Patient/family education;Balance training      OT Goals(Current goals can be found in the care plan section)   Acute Rehab OT Goals Patient Stated Goal: return home OT Goal Formulation: With patient Time For Goal Achievement: 04/03/24 Potential to Achieve Goals: Good   OT Frequency:  Min 2X/week    Co-evaluation PT/OT/SLP Co-Evaluation/Treatment: Yes Reason for Co-Treatment: To address functional/ADL transfers   OT goals addressed during session: ADL's and self-care                       End of Session Equipment Utilized During Treatment: Rolling  walker (2 wheels);Gait belt Nurse Communication: Other (comment) (notified pt was in the chair)  Activity Tolerance: Patient tolerated treatment well Patient left: in chair;with call bell/phone within reach;with chair alarm set  OT Visit Diagnosis: Unsteadiness on feet (R26.81);Other abnormalities of gait and mobility (R26.89);Muscle weakness (generalized) (M62.81)                Time: 1610-9604 OT Time Calculation (min): 26 min Charges:  OT General Charges $OT Visit: 1 Visit OT Evaluation $OT Eval Low Complexity: 1 Low  Eulon Allnutt OT, MOT  Thurnell Floss 03/20/2024, 9:29 AM

## 2024-03-20 NOTE — Plan of Care (Signed)
  Problem: Acute Rehab PT Goals(only PT should resolve) Goal: Pt Will Go Supine/Side To Sit Outcome: Progressing Flowsheets (Taken 03/20/2024 1023) Pt will go Supine/Side to Sit: Independently Goal: Patient Will Transfer Sit To/From Stand Outcome: Progressing Flowsheets (Taken 03/20/2024 1023) Patient will transfer sit to/from stand: with supervision Goal: Pt Will Transfer Bed To Chair/Chair To Bed Outcome: Progressing Flowsheets (Taken 03/20/2024 1023) Pt will Transfer Bed to Chair/Chair to Bed: with supervision Goal: Pt Will Ambulate Outcome: Progressing Flowsheets (Taken 03/20/2024 1023) Pt will Ambulate:  25 feet  with supervision  with rolling walker   10:24 AM, 03/20/24 Marysue Sola, PT, DPT [place] with [facility]

## 2024-03-20 NOTE — TOC Initial Note (Signed)
 Transition of Care [facility]) - Initial/Assessment Note    Patient Details  Name: [name] MRN: [id number] Date of Birth: 1955-12-14  [facility] CM/SW Contact:    Ander Katos, LCSW Phone Number: 03/20/2024, 11:13 AM  Clinical Narrative:  Pt admitted due to acute right hip fracture. Pt recently discharged home following right hip fracture and was set up with [facility]. PT evaluated pt and recommend SNF. LCSW discussed with pt who is unsure and wants to discuss with her husband. She indicates her husband is with her around the clock and she is leaning towards returning home with home health. LCSW agreed to follow up this afternoon for decision.                  Expected Discharge Plan:  (unsure)     Patient Goals and CMS Choice Patient states their goals for this hospitalization and ongoing recovery are:: to be determined   Choice offered to / list presented to : Patient      Expected Discharge Plan and Services In-house Referral: Clinical Social Work     Living arrangements for the past 2 months: Single Family Home                           HH Arranged: PT HH Agency: [facility] Date [facility] Agency Contacted: 03/20/24 Time HH Agency Contacted: 1113 Representative spoke with at [facility] Agency: Louanna Rouse  Prior Living Arrangements/Services Living arrangements for the past 2 months: Single Family Home Lives with:: Spouse Patient language and need for interpreter reviewed:: Yes Do you feel safe going back to the place where you live?: Yes      Need for Family Participation in Patient Care: No (Comment) Care giver support system in place?: Yes (comment) Current home services: Home PT, DME (walker, shower chair, cane) Criminal Activity/Legal Involvement Pertinent to Current Situation/Hospitalization: No - Comment as needed  Activities of Daily Living   ADL Screening (condition at time of admission) Independently performs ADLs?: No Does the  patient have a NEW difficulty with bathing/dressing/toileting/self-feeding that is expected to last >3 days?: No Does the patient have a NEW difficulty with getting in/out of bed, walking, or climbing stairs that is expected to last >3 days?: No Does the patient have a NEW difficulty with communication that is expected to last >3 days?: No Is the patient deaf or have difficulty hearing?: No Does the patient have difficulty seeing, even when wearing glasses/contacts?: No Does the patient have difficulty concentrating, remembering, or making decisions?: No  Permission Sought/Granted   Permission granted to share information with : Yes, Verbal Permission Granted     Permission granted to share info w AGENCY: [phone]  Permission granted to share info w Relationship: Home health     Emotional Assessment     Affect (typically observed): Appropriate Orientation: : Oriented to Self, Oriented to Place, Oriented to  Time, Oriented to Situation Alcohol / Substance Use: Not Applicable Psych Involvement: No (comment)  Admission diagnosis:  Hip fracture (HCC) [S72.009A] Acute pain of right hip [M25.551] Patient Active Problem List   Diagnosis Date Noted   Hip fracture (HCC) 03/19/2024   Hyponatremia 03/19/2024   Closed right hip fracture (HCC) 03/02/2024   Abnormal mammogram 06/17/2019   Abnormal CXR 05/16/2019   Tobacco abuse, 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   Cataract 05/16/2019   Screening for breast cancer 05/16/2019   CAP (community acquired pneumonia) 05/02/2019   Lobar  pneumonia (HCC) 05/02/2019   AKI (acute kidney injury) (HCC)    Headache around the eyes    Hypertension 05/28/2016   Hypokalemia 05/28/2016   Closed left hip fracture (HCC) 05/28/2016   CARCINOMA, SKIN, SQUAMOUS CELL 06/05/2009   RESTLESS LEG SYNDROME 06/06/2007   GERD 01/27/2007   Hyperlipidemia 10/19/2006   TOBACCO ABUSE 10/19/2006   Coronary atherosclerosis 10/19/2006   BRONCHITIS, CHRONIC NOS 10/19/2006   Asthma  10/19/2006   LOW BACK PAIN 10/19/2006   HYPERGLYCEMIA 10/19/2006   PCP:  Brantley Caldwell, NP Pharmacy:   [facility] [phone] - [place], [place] - [address] AT [facility] [address] [place] [address] Phone: [phone] Fax: [phone]  [facility] [place], [place] - [address] [place] [address] Phone: [phone] Fax: [phone]     Social Drivers of Health (SDOH) Social History: SDOH Screenings   Food Insecurity: No Food Insecurity (03/19/2024)  Housing: Low Risk  (03/19/2024)  Transportation Needs: No Transportation Needs (03/19/2024)  Utilities: Not At Risk (03/19/2024)  Social Connections: Moderately Isolated (03/19/2024)  Tobacco Use: Medium Risk (03/02/2024)   SDOH Interventions:     Readmission Risk Interventions    03/19/2024    8:08 PM  Readmission Risk Prevention Plan  Post Dischage Appt Complete  Medication Screening Complete  Transportation Screening Complete

## 2024-03-20 NOTE — Progress Notes (Signed)
 PROGRESS NOTE    [name]  ZOX:[id number] DOB: 02/09/1956 DOA: 03/19/2024 PCP: Brantley Caldwell, NP   Brief Narrative:    [name] is a 67 y.o. female with medical history significant for CAD, hypertension, dyslipidemia, and recent right hip fracture repair with discharge on 4/6 who presents back to the ED with sudden onset right hip pain that occurred as she was trying to go to her bathroom.  She nearly fell, but states that her husband was nearby and was able to catch her.  She was admitted for repeat acute right hip fracture and has undergone cephalomedullary nail to right subtrochanteric femur fracture on 4/21.  Assessment & Plan:   Principal Problem:   Hip fracture (HCC) Active Problems:   Hyperlipidemia   TOBACCO ABUSE   Coronary atherosclerosis   GERD   Hypertension   Closed right hip fracture (HCC)   Hyponatremia  Assessment and Plan:   Acute right hip fracture-spontaneous - Recent discharge on 4/6 for hip fracture to right side noted then as well - She was discharged to home at that time with home health PT/OT - Now status post cephalomedullary nail for right subtrochanteric femur fracture on 4/21   Severe hyponatremia-improved - Appears to be asymptomatic from this - Likely related to HCTZ use - Plan to check serum and urine osmolarity as well as TSH levels - Continue normal saline and monitor repeat labs   CAD/hypertension - Continue antihypertensives with the exception of hydrochlorothiazide   Hypothyroidism - TSH 0.96 - Continue levothyroxine    History of tobacco abuse - Counseled on cessation    DVT prophylaxis: SCDs Code Status: Full Family Communication: None at bedside Disposition Plan:  Status is: Inpatient Remains inpatient appropriate because: Need for inpatient rehab placement, IV meds.   Consultants:  Orthopedics  Procedures:  Cephalomedullary nail for right subtrochanteric femur fracture 4/21  Antimicrobials:   Anti-infectives (From admission, onward)    Start     Dose/Rate Route Frequency Ordered Stop   03/19/24 2200  ceFAZolin  (ANCEF ) IVPB 2g/100 mL premix        2 g 200 mL/hr over 30 Minutes Intravenous Every 8 hours 03/19/24 1721 03/20/24 2159   03/19/24 1215  ceFAZolin  (ANCEF ) IVPB 2g/100 mL premix        2 g 200 mL/hr over 30 Minutes Intravenous On call to O.R. 03/19/24 1243 03/19/24 1430      Subjective: Patient seen and evaluated today with no new acute complaints or concerns. No acute concerns or events noted overnight.  Objective: Vitals:   03/19/24 1642 03/19/24 1700 03/19/24 2012 03/20/24 0516  BP: (!) 106/94 (!) 110/59 (!) 107/59 (!) 108/47  Pulse: 78 80 66 76  Resp: 16 18 17 17   Temp: 98.7 F (37.1 C) 98.7 F (37.1 C) 98 F (36.7 C) 99.5 F (37.5 C)  TempSrc:  Oral Oral Oral  SpO2: 94% 97% 96% 92%    Intake/Output Summary (Last 24 hours) at 03/20/2024 1353 Last data filed at 03/20/2024 0900 Gross per 24 hour  Intake 1190 ml  Output 2230 ml  Net -1040 ml   There were no vitals filed for this visit.  Examination:  General exam: Appears calm and comfortable  Respiratory system: Clear to auscultation. Respiratory effort normal. Cardiovascular system: S1 & S2 heard, RRR.  Gastrointestinal system: Abdomen is soft Central nervous system: Alert and awake Extremities: No edema Skin: No significant lesions noted Psychiatry: Flat affect.    Data Reviewed: I have personally reviewed  following labs and imaging studies  CBC: Recent Labs  Lab 03/19/24 0839 03/19/24 1337 03/20/24 0438  WBC 10.6*  --  7.6  NEUTROABS 9.1*  --   --   HGB 11.6* 11.9* 9.6*  HCT 32.6* 35.0* 28.8*  MCV 92.1  --  97.0  PLT 393  --  332   Basic Metabolic Panel: Recent Labs  Lab 03/19/24 0839 03/19/24 1337 03/19/24 1734 03/20/24 0438  NA 122* 125* 126* 132*  K 3.2* 3.5  --  4.3  CL 86* 90*  --  98  CO2 22  --   --  24  GLUCOSE 128* 133*  --  108*  BUN 6* 5*  --  8   CREATININE 0.75 0.70  --  0.75  CALCIUM  8.8*  --   --  8.1*  MG  --   --   --  1.7   GFR: CrCl cannot be calculated (Unknown ideal weight.). Liver Function Tests: No results for input(s): "AST", "ALT", "ALKPHOS", "BILITOT", "PROT", "ALBUMIN" in the last 168 hours. No results for input(s): "LIPASE", "AMYLASE" in the last 168 hours. No results for input(s): "AMMONIA" in the last 168 hours. Coagulation Profile: No results for input(s): "INR", "PROTIME" in the last 168 hours. Cardiac Enzymes: No results for input(s): "CKTOTAL", "CKMB", "CKMBINDEX", "TROPONINI" in the last 168 hours. BNP (last 3 results) No results for input(s): "PROBNP" in the last 8760 hours. HbA1C: No results for input(s): "HGBA1C" in the last 72 hours. CBG: No results for input(s): "GLUCAP" in the last 168 hours. Lipid Profile: No results for input(s): "CHOL", "HDL", "LDLCALC", "TRIG", "CHOLHDL", "LDLDIRECT" in the last 72 hours. Thyroid Function Tests: Recent Labs    03/19/24 1012  TSH 0.964   Anemia Panel: No results for input(s): "VITAMINB12", "FOLATE", "FERRITIN", "TIBC", "IRON", "RETICCTPCT" in the last 72 hours. Sepsis Labs: No results for input(s): "PROCALCITON", "LATICACIDVEN" in the last 168 hours.  No results found for this or any previous visit (from the past 240 hours).       Radiology Studies: DG FEMUR PORT, MIN 2 VIEWS RIGHT Result Date: 03/19/2024 CLINICAL DATA:  Postop EXAM: RIGHT FEMUR PORTABLE 2 VIEW COMPARISON:  03/19/2024 FINDINGS: Removal of threaded screws from the proximal femur. Interval intramedullary rod and screw fixation of proximal subtrochanteric fracture with decreased angulation and displacement. Gas in the soft tissues consistent with recent surgery IMPRESSION: Interval ORIF of proximal femoral fracture. Electronically Signed   By: Esmeralda Hedge M.D.   On: 03/19/2024 20:27   DG Pelvis Portable Result Date: 03/19/2024 CLINICAL DATA:  Right femoral fracture postop. EXAM:  PORTABLE PELVIS 1 VIEWS COMPARISON:  05/29/2016. FINDINGS: Status post left total hip arthroplasty. Postop ORIF right femoral neck. Pelvic ring intact. No osteolytic or osteoblastic changes. IMPRESSION: Postoperative changes. No acute osseous abnormalities. Electronically Signed   By: Sydell Eva M.D.   On: 03/19/2024 20:26   DG HIP UNILAT WITH PELVIS 2-3 VIEWS RIGHT Result Date: 03/19/2024 CLINICAL DATA:  Inter trochanteric right femur fracture. EXAM: DG HIP (WITH OR WITHOUT PELVIS) 2-3V RIGHT COMPARISON:  Preoperative imaging FINDINGS: Eight fluoroscopic spot views of the right hip submitted from the operating room. Femoral intramedullary nail with trans trochanteric and distal locking screw fixation traverse proximal femur fracture. The previous screws traversing the femoral neck fracture have been removed. Fluoroscopy time 2 minute 7 seconds. Dose 31.71 mGy IMPRESSION: Intraoperative fluoroscopy during right femur fracture fixation. Electronically Signed   By: Chadwick Colonel M.D.   On: 03/19/2024 17:07  DG C-Arm 1-60 Min-No Report Result Date: 03/19/2024 Fluoroscopy was utilized by the requesting physician.  No radiographic interpretation.   DG C-Arm 1-60 Min-No Report Result Date: 03/19/2024 Fluoroscopy was utilized by the requesting physician.  No radiographic interpretation.   DG Femur Min 2 Views Right Result Date: 03/19/2024 CLINICAL DATA:  Status post fall. EXAM: RIGHT FEMUR 2 VIEWS COMPARISON:  03/03/2024 FINDINGS: Three screws from previous ORIF of right femoral neck fracture are again noted. There is a new, acute fracture involving the proximal right femur at the level of the lesser trochanter. Slight impaction and medial angulation of the distal fracture fragments noted. The distal right femur appears intact. IMPRESSION: Acute fracture involving the proximal right femur at the level of the lesser trochanter. Electronically Signed   By: Kimberley Penman M.D.   On: 03/19/2024 07:20    DG Hip Unilat W or Wo Pelvis 2-3 Views Right Result Date: 03/19/2024 CLINICAL DATA:  Hip pain EXAM: DG HIP (WITH OR WITHOUT PELVIS) 2-3V RIGHT COMPARISON:  03/03/2024 FINDINGS: There are 3 cannulated screws are noted traversing the right femoral neck fracture. There is a new, acute fracture involving the proximal right femur just below the level of the lesser trochanter. Medial angulation of the distal fracture fragments identified. Status post left total hip arthroplasty. IMPRESSION: Acute fracture involving the proximal right femur just below the level of the lesser trochanter. Electronically Signed   By: Kimberley Penman M.D.   On: 03/19/2024 07:19        Scheduled Meds:  aspirin  EC  81 mg Oral BID   Chlorhexidine  Gluconate Cloth  6 each Topical Q0600   levothyroxine   25 mcg Oral Q0600   Continuous Infusions:   ceFAZolin  (ANCEF ) IV 2 g (03/20/24 0528)     LOS: 1 day    Time spent: 35 minutes    Reena Borromeo Loran Rock, DO [facility]  If 7PM-7AM, please contact night-coverage [email or web address] 03/20/2024, 1:53 PM

## 2024-03-20 NOTE — Plan of Care (Signed)
  Problem: Acute Rehab OT Goals (only OT should resolve) Goal: Pt. Will Perform Grooming Flowsheets (Taken 03/20/2024 0931) Pt Will Perform Grooming:  with modified independence  standing Goal: Pt. Will Perform Lower Body Bathing Flowsheets (Taken 03/20/2024 0931) Pt Will Perform Lower Body Bathing:  with modified independence  sitting/lateral leans Goal: Pt. Will Perform Lower Body Dressing Flowsheets (Taken 03/20/2024 0931) Pt Will Perform Lower Body Dressing:  with modified independence  sitting/lateral leans Goal: Pt. Will Transfer To Toilet Flowsheets (Taken 03/20/2024 [phone]) Pt Will Transfer to Toilet:  with modified independence  ambulating Goal: Pt. Will Perform Toileting-Clothing Manipulation Flowsheets (Taken 03/20/2024 0931) Pt Will Perform Toileting - Clothing Manipulation and hygiene:  with modified independence  sitting/lateral leans  Zuha Dejonge OT, MOT

## 2024-03-20 NOTE — Progress Notes (Signed)
   ORTHOPAEDIC PROGRESS NOTE  s/p Procedure(s): Removal of cannulated screws CMN for a right subtrochanteric femur fracture   DOS: 03/19/24  SUBJECTIVE: No issues over night.  Pain is better.  She is starting to get sore from laying in bed  OBJECTIVE: PE:  Alert and oriented. No acute distress  Lateral thigh dressings are clean, dry and intact Some redness and bruising around the dressings Sensation intact to the dorsum of the right foot Active motion intact EHL/TA Toes are warm and well perfused    Vitals:   03/19/24 2012 03/20/24 0516  BP: (!) 107/59 (!) 108/47  Pulse: 66 76  Resp: 17 17  Temp: 98 F (36.7 C) 99.5 F (37.5 C)  SpO2: 96% 92%      Latest Ref Rng & Units 03/20/2024    4:38 AM 03/19/2024    1:37 PM 03/19/2024    8:39 AM  CBC  WBC 4.0 - 10.5 K/uL 7.6   10.6   Hemoglobin 12.0 - 15.0 g/dL 9.6  04.5  40.9   Hematocrit 36.0 - 46.0 % 28.8  35.0  32.6   Platelets 150 - 400 K/uL 332   393      ASSESSMENT: [name] is a 68 y.o. female doing well postoperatively.  PLAN: Weightbearing: WBAT RLE Incisional and dressing care: Reinforce dressings as needed Orthopedic device(s): None VTE prophylaxis: Recommend 81 mg Aspirin  BID x 6 weeks Pain control: As needed.  Judicious use of narcotics Follow - up plan: 2 weeks for staple removal and XR   Contact information:     Kazandra Forstrom A. Ernesta Heading, MD MS [facility] [address] [place],  [place]  [address] Phone: [phone] Fax: [phone]

## 2024-03-20 NOTE — Evaluation (Signed)
 Physical Therapy Evaluation Patient Details Name: [name] MRN: [id number] DOB: Apr 22, 1956 Today's Date: 03/20/2024  History of Present Illness  [name] is a 68 y.o. female with medical history significant for CAD, hypertension, dyslipidemia, and recent right hip fracture repair with discharge on 4/6 who presents back to the ED with sudden onset right hip pain that occurred as she was trying to go to her bathroom.  She nearly fell, but states that her husband was nearby and was able to catch her.  She denies any other injury.  She denies any other symptoms of nausea, vomiting, or abdominal pain.  No lightheadedness or dizziness noted.  She has been eating and drinking as usual and continues to take her home medications as well.   Clinical Impression  Patient was limited during PT/OT evaluation secondary to fatigue, requiring breaks t/o transfers and ambulation trial. At baseline, patient was (I) with all mobility and ADL/iADLs. Since most recent hospitalization from initial surgery, patient has required assist with all. On this date, patient was mod(I) with bed mobility, requiring increased time to complete and use of UE to move RLE, and CGA for transfers and ambulation. Patient demonstrates general weakness but decreased strength and ROM on RLE since recent surgeries, increasing her fall risk. Patient reports husband has seizure disorder and just the two of them being home.   Patient will benefit from continued skilled physical therapy acutely and in recommended venue in order to address her deficits before returning home for her safety.         If plan is discharge home, recommend the following: A little help with walking and/or transfers;A little help with bathing/dressing/bathroom;Assistance with cooking/housework;Assist for transportation   Can travel by private vehicle   Yes    Equipment Recommendations None recommended by PT  Recommendations for Other Services       Functional  Status Assessment Patient has had a recent decline in their functional status and demonstrates the ability to make significant improvements in function in a reasonable and predictable amount of time.     Precautions / Restrictions Precautions Precautions: Fall Recall of Precautions/Restrictions: Intact Restrictions Weight Bearing Restrictions Per Provider Order: No RLE Weight Bearing Per Provider Order: Weight bearing as tolerated      Mobility  Bed Mobility Overal bed mobility: Modified Independent       General bed mobility comments: Mild labored movement; using B UE to move R UE to EOB. No railing use.    Transfers Overall transfer level: Needs assistance Equipment used: Rolling walker (2 wheels) Transfers: Sit to/from Stand, Bed to chair/wheelchair/BSC Sit to Stand: Contact guard assist   Step pivot transfers: Contact guard assist       General transfer comment: CGA for safety; labored movement; extended time and planning by pt for sit to stand from EOB and chair. v cues for extending RLE to dec pain during tfs    Ambulation/Gait Ambulation/Gait assistance: Contact guard assist Gait Distance (Feet): 15 Feet Assistive device: Rolling walker (2 wheels) Gait Pattern/deviations: Step-through pattern, Decreased stance time - right, Decreased weight shift to right Gait velocity: Decreased     General Gait Details: w/ RW and CGA for safety. pt limited d/t fatigue  Stairs      Wheelchair Mobility     Tilt Bed    Modified Rankin (Stroke Patients Only)       Balance Overall balance assessment: Needs assistance Sitting-balance support: No upper extremity supported, Feet supported Sitting balance-Leahy Scale: Good Sitting  balance - Comments: seated at EOB, pt able to donn L sock (I)   Standing balance support: Bilateral upper extremity supported, During functional activity, Reliant on assistive device for balance Standing balance-Leahy Scale: Fair Standing  balance comment: using RW           Pertinent Vitals/Pain Pain Assessment Pain Assessment: No/denies pain    Home Living Family/patient expects to be discharged to:: Private residence Living Arrangements: Spouse/significant other Available Help at Discharge: Family;Available 24 hours/day Type of Home: Mobile home Home Access: Stairs to enter Entrance Stairs-Rails: Can reach both Entrance Stairs-Number of Steps: 4   Home Layout: One level Home Equipment: Agricultural consultant (2 wheels) Additional Comments: Pt reports same living history.    Prior Function Prior Level of Function : Needs assist       Physical Assist : Mobility (physical);ADLs (physical) Mobility (physical): Transfers ADLs (physical): Bathing Mobility Comments: Pt reports use of RW and supervision assist from husband for shower transfer since being home. ADLs Comments: Pt reports independence with ADL's since being home from recent hospitalization.     Extremity/Trunk Assessment   Upper Extremity Assessment Upper Extremity Assessment: Defer to OT evaluation    Lower Extremity Assessment Lower Extremity Assessment: RLE deficits/detail;LLE deficits/detail RLE Deficits / Details: At least 3+/5 knee ext and hip flexion on RLE. ankle DF 4+/5. Did not apply resist 2/2 recent op. RLE: Unable to fully assess due to pain RLE Coordination: decreased gross motor;decreased fine motor LLE Deficits / Details: 4+/5 ankle DF and hip flex. LLE Coordination: decreased gross motor;decreased fine motor    Cervical / Trunk Assessment Cervical / Trunk Assessment: Normal  Communication   Communication Communication: No apparent difficulties    Cognition Arousal: Alert Behavior During Therapy: WFL for tasks assessed/performed   PT - Cognitive impairments: No apparent impairments       Following commands: Intact       Cueing Cueing Techniques: Verbal cues     General Comments      Exercises      Assessment/Plan    PT Assessment Patient needs continued PT services;All further PT needs can be met in the next venue of care  PT Problem List Decreased strength;Decreased range of motion;Decreased activity tolerance;Decreased balance;Decreased mobility       PT Treatment Interventions DME instruction;Gait training;Functional mobility training;Therapeutic activities;Therapeutic exercise;Balance training;Patient/family education    PT Goals (Current goals can be found in the Care Plan section)  Acute Rehab PT Goals Patient Stated Goal: Return home PT Goal Formulation: With patient Time For Goal Achievement: 03/27/24 Potential to Achieve Goals: Good    Frequency Min 3X/week     Co-evaluation PT/OT/SLP Co-Evaluation/Treatment: Yes Reason for Co-Treatment: To address functional/ADL transfers PT goals addressed during session: Mobility/safety with mobility;Proper use of DME OT goals addressed during session: ADL's and self-care       AM-PAC PT "6 Clicks" Mobility  Outcome Measure Help needed turning from your back to your side while in a flat bed without using bedrails?: None Help needed moving from lying on your back to sitting on the side of a flat bed without using bedrails?: A Little Help needed moving to and from a bed to a chair (including a wheelchair)?: A Little Help needed standing up from a chair using your arms (e.g., wheelchair or bedside chair)?: A Little Help needed to walk in hospital room?: A Little Help needed climbing 3-5 steps with a railing? : A Lot 6 Click Score: 18    End of Session  Equipment Utilized During Treatment: Gait belt Activity Tolerance: Patient tolerated treatment well;Patient limited by fatigue Patient left: in chair;with call bell/phone within reach;with chair alarm set   PT Visit Diagnosis: Muscle weakness (generalized) (M62.81);Difficulty in walking, not elsewhere classified (R26.2);Other abnormalities of gait and mobility (R26.89)     Time: 1610-9604 PT Time Calculation (min) (ACUTE ONLY): 15 min   Charges:   PT Evaluation $PT Eval Low Complexity: 1 Low PT Treatments $Therapeutic Activity: 8-22 mins PT General Charges $$ ACUTE PT VISIT: 1 Visit        10:22 AM, 03/20/24 Marysue Sola, PT, DPT [place] with [facility]

## 2024-03-20 NOTE — TOC Progression Note (Signed)
 Transition of Care [facility]) - Progression Note    Patient Details  Name: [name] MRN: [id number] Date of Birth: Dec 29, 1955  [facility] of [facility]) CM/SW Contact  Ander Katos, [place] Phone Number: 03/20/2024, 2:36 PM  Clinical Narrative:  Pt agreeable to SNF after discussing with family. Requests PNC. Facility has made bed offer. Pt notified and accepts. CMA to start authorization.      Expected Discharge Plan:  (unsure)    Expected Discharge Plan and Services In-house Referral: Clinical Social Work     Living arrangements for the past 2 months: Single Family Home                           HH Arranged: PT HH Agency: [facility] Date [facility] Agency Contacted: 03/20/24 Time HH Agency Contacted: 1113 Representative spoke with at [facility] Agency: Louanna Rouse   Social Determinants of Health (SDOH) Interventions SDOH Screenings   Food Insecurity: No Food Insecurity (03/19/2024)  Housing: Low Risk  (03/19/2024)  Transportation Needs: No Transportation Needs (03/19/2024)  Utilities: Not At Risk (03/19/2024)  Social Connections: Moderately Isolated (03/19/2024)  Tobacco Use: Medium Risk (03/02/2024)    Readmission Risk Interventions    03/19/2024    8:08 PM  Readmission Risk Prevention Plan  Post Dischage Appt Complete  Medication Screening Complete  Transportation Screening Complete

## 2024-03-21 ENCOUNTER — Encounter (HOSPITAL_COMMUNITY): Payer: Self-pay | Admitting: Orthopedic Surgery

## 2024-03-21 DIAGNOSIS — S72001A Fracture of unspecified part of neck of right femur, initial encounter for closed fracture: Secondary | ICD-10-CM | POA: Diagnosis not present

## 2024-03-21 LAB — CBC
HCT: 29.1 % — ABNORMAL LOW (ref 36.0–46.0)
Hemoglobin: 9 g/dL — ABNORMAL LOW (ref 12.0–15.0)
MCH: 31.9 pg (ref 26.0–34.0)
MCHC: 30.9 g/dL (ref 30.0–36.0)
MCV: 103.2 fL — ABNORMAL HIGH (ref 80.0–100.0)
Platelets: 273 10*3/uL (ref 150–400)
RBC: 2.82 MIL/uL — ABNORMAL LOW (ref 3.87–5.11)
RDW: 12.2 % (ref 11.5–15.5)
WBC: 8.2 10*3/uL (ref 4.0–10.5)
nRBC: 0 % (ref 0.0–0.2)

## 2024-03-21 LAB — BASIC METABOLIC PANEL WITH GFR
Anion gap: 7 (ref 5–15)
BUN: 8 mg/dL (ref 8–23)
CO2: 26 mmol/L (ref 22–32)
Calcium: 8.2 mg/dL — ABNORMAL LOW (ref 8.9–10.3)
Chloride: 98 mmol/L (ref 98–111)
Creatinine, Ser: 0.85 mg/dL (ref 0.44–1.00)
GFR, Estimated: >60 mL/min (ref >60)
Glucose, Bld: 124 mg/dL — ABNORMAL HIGH (ref 70–99)
Potassium: 4.3 mmol/L (ref 3.5–5.1)
Sodium: 131 mmol/L — ABNORMAL LOW (ref 135–145)

## 2024-03-21 LAB — MAGNESIUM: Magnesium: 1.8 mg/dL (ref 1.7–2.4)

## 2024-03-21 MED ORDER — ASPIRIN 81 MG PO TBEC: 81.0000 mg | DELAYED_RELEASE_TABLET | Freq: Two times a day (BID) | ORAL | 0 refills | Status: AC

## 2024-03-21 MED ORDER — OXYCODONE HCL 5 MG PO TABS: 5.0000 mg | ORAL_TABLET | Freq: Three times a day (TID) | ORAL | 0 refills | Status: DC | PRN

## 2024-03-21 MED ORDER — ALBUTEROL SULFATE (2.5 MG/3ML) 0.083% IN NEBU: 2.5000 mg | INHALATION_SOLUTION | Freq: Three times a day (TID) | RESPIRATORY_TRACT | Status: DC | PRN

## 2024-03-21 NOTE — TOC Transition Note (Signed)
 Transition of Care [facility]) - Discharge Note   Patient Details  Name: [name] MRN: [id number] Date of Birth: 1956-04-09  Transition of Care [facility]) CM/SW Contact:  Ander Katos, LCSW Phone Number: 03/21/2024, 10:24 AM   Clinical Narrative:  Pt d/c today to [facility]. Pt and facility aware and agreeable. Authorization received. D/C summary sent to SNF. Pt will transfer with staff. RN given number to call report.      Final next level of care: Skilled Nursing Facility Barriers to Discharge: Barriers Resolved   Patient Goals and CMS Choice Patient states their goals for this hospitalization and ongoing recovery are:: to be determined   Choice offered to / list presented to : Patient      Discharge Placement              Patient chooses bed at: [facility] Patient to be transferred to facility by: staff   Patient and family notified of of transfer: 03/21/24  Discharge Plan and Services Additional resources added to the After Visit Summary for   In-house Referral: Clinical Social Work                        HH Arranged: PT [facility] Agency: [facility] Date [facility] Agency Contacted: 03/20/24 Time HH Agency Contacted: 1113 Representative spoke with at [facility] Agency: Louanna Rouse  Social Drivers of Health (SDOH) Interventions SDOH Screenings   Food Insecurity: No Food Insecurity (03/19/2024)  Housing: Low Risk  (03/19/2024)  Transportation Needs: No Transportation Needs (03/19/2024)  Utilities: Not At Risk (03/19/2024)  Social Connections: Moderately Isolated (03/19/2024)  Tobacco Use: Medium Risk (03/02/2024)     Readmission Risk Interventions    03/19/2024    8:08 PM  Readmission Risk Prevention Plan  Post Dischage Appt Complete  Medication Screening Complete  Transportation Screening Complete

## 2024-03-21 NOTE — Progress Notes (Signed)
 Mobility Specialist Progress Note:    03/21/24 1130  Mobility  Activity Ambulated with assistance in room;Stood at bedside  Level of Assistance Contact guard assist, steadying assist  Assistive Device Front wheel walker  Distance Ambulated (ft) 30 ft  Range of Motion/Exercises Active;All extremities  RLE Weight Bearing Per Provider Order WBAT  Activity Response Tolerated well  Mobility visit 1 Mobility  Mobility Specialist Start Time (ACUTE ONLY) 1100  Mobility Specialist Stop Time (ACUTE ONLY) 1125  Mobility Specialist Time Calculation (min) (ACUTE ONLY) 25 min   Pt received requesting assistance to [facility] and ambulate in room. Required CGA to stand and ambulate with RW. Tolerated well, RLE WBAT. Returned pt to chair, alarm on. Call bell in reach, all needs met.  Glinda Lapping Mobility Specialist Please contact via Special educational needs teacher or  Rehab office at [phone]

## 2024-03-21 NOTE — Progress Notes (Signed)
 Pt A&O x4. Pt assisted up to [facility] to void and then to recliner. PT able to move and stand with minimal assistance, using FWW and small steps. Pt's only complaint at present is back pain due to lying in the bed. Chair alarm on for safety. Call bell within reach, advised to call for needs.

## 2024-03-21 NOTE — Plan of Care (Signed)

## 2024-03-21 NOTE — Care Management Important Message (Signed)
 Important Message  Patient Details  Name: MARKIYA KEEFE MRN: [id number] Date of Birth: 12/22/1955   Important Message Given:  N/A - LOS <3 / Initial given by admissions     Neila Bally 03/21/2024, 11:05 AM

## 2024-03-21 NOTE — Progress Notes (Signed)
 Pt updated on assigned room at [facility] and plans to discharge to rehab after lunch. Pt states understanding.

## 2024-03-21 NOTE — Anesthesia Postprocedure Evaluation (Signed)
 Anesthesia Post Note  Patient: [name]  Procedure(s) Performed: REMOVAL, HARDWARE (Right: Hip) FIXATION, FRACTURE, INTERTROCHANTERIC, WITH INTRAMEDULLARY ROD (Right: Hip)  Patient location during evaluation: Phase II Anesthesia Type: General Level of consciousness: awake Pain management: pain level controlled Vital Signs Assessment: post-procedure vital signs reviewed and stable Respiratory status: spontaneous breathing and respiratory function stable Cardiovascular status: blood pressure returned to baseline and stable Postop Assessment: no headache and no apparent nausea or vomiting Anesthetic complications: no Comments: Late entry   No notable events documented.   Last Vitals:  Vitals:   03/20/24 1945 03/21/24 0335  BP: 120/67 101/60  Pulse: 88 82  Resp: 16 16  Temp: 37.5 C 37.1 C  SpO2: 96% 95%    Last Pain:  Vitals:   03/21/24 0836  TempSrc:   PainSc: 4                  Coretha Dew

## 2024-03-21 NOTE — Discharge Summary (Signed)
 Physician Discharge Summary  [name] BJY:[id number] DOB: 05-Apr-1956 DOA: 03/19/2024  PCP: Rebecca Caldwell, NP  Admit date: 03/19/2024  Discharge date: 03/21/2024  Admitted From:Home  Disposition:  SNF  Recommendations for Outpatient Follow-up:  Follow up with PCP in 1-2 weeks Follow-up with Dr. Ernesta Heading with orthopedics in 2 weeks as recommended for staple removal and imaging follow-up Continue to remain on aspirin  81 mg twice daily for DVT prophylaxis for 6 weeks as prescribed Pain medications prescribed as needed Hold home HCTZ and continue to monitor blood pressure and recheck BMP in 1 week to ensure sodium levels are stable Continue other home medications as prior  Home Health: None  Equipment/Devices: None  Discharge Condition:Stable  CODE STATUS: Full  Diet recommendation: Heart Healthy  Brief/Interim Summary: [name] is a 68 y.o. female with medical history significant for CAD, hypertension, dyslipidemia, and recent right hip fracture repair with discharge on 4/6 who presents back to the ED with sudden onset right hip pain that occurred as she was trying to go to her bathroom.  She nearly fell, but states that her husband was nearby and was able to catch her.  She was admitted for repeat acute right hip fracture and has undergone cephalomedullary nail to right subtrochanteric femur fracture on 4/21.  She was seen by PT/OT with recommendations for SNF and now will discharge to facility today.  No other acute events or concerns noted.  Discharge Diagnoses:  Principal Problem:   Hip fracture (HCC) Active Problems:   Hyperlipidemia   TOBACCO ABUSE   Coronary atherosclerosis   GERD   Hypertension   Closed right hip fracture (HCC)   Hyponatremia  Principal discharge diagnosis: Acute right hip fracture-recurrent along with severe hyponatremia related to HCTZ use.  Discharge Instructions  Discharge Instructions     Diet - low sodium heart healthy    Complete by: As directed    Increase activity slowly   Complete by: As directed       Allergies as of 03/21/2024       Reactions   Cortisone Other (See Comments)   Fluid retention and hives        Medication List     STOP taking these medications    aspirin  81 MG chewable tablet Replaced by: aspirin  EC 81 MG tablet   hydrochlorothiazide 12.5 MG tablet Commonly known as: HYDRODIURIL       TAKE these medications    albuterol  (2.5 MG/3ML) 0.083% nebulizer solution Commonly known as: PROVENTIL  Take 2.5 mg by nebulization 3 (three) times daily as needed for wheezing or shortness of breath.   albuterol  108 (90 Base) MCG/ACT inhaler Commonly known as: VENTOLIN  HFA Inhale 1-2 puffs into the lungs every 6 (six) hours as needed for wheezing or shortness of breath.   aspirin  EC 81 MG tablet Take 1 tablet (81 mg total) by mouth 2 (two) times daily. Swallow whole. Replaces: aspirin  81 MG chewable tablet   atorvastatin  20 MG tablet Commonly known as: LIPITOR Take 1 tablet (20 mg total) by mouth daily.   benzonatate  100 MG capsule Commonly known as: TESSALON  Take 100 mg by mouth daily.   cyanocobalamin 500 MCG tablet Commonly known as: VITAMIN B12 Take 500 mcg by mouth daily.   ezetimibe 10 MG tablet Commonly known as: ZETIA Take 10 mg by mouth daily.   Fish Oil 1000 MG Caps Take 1 capsule by mouth daily.   fluticasone  50 MCG/ACT nasal spray Commonly known as: FLONASE  Place 1-2  sprays into both nostrils daily.   levothyroxine  25 MCG tablet Commonly known as: SYNTHROID  Take 25 mcg by mouth every morning.   montelukast  10 MG tablet Commonly known as: SINGULAIR  Take 10 mg by mouth daily.   niacin 500 MG tablet Commonly known as: VITAMIN B3 Take 500 mg by mouth at bedtime.   oxyCODONE  5 MG immediate release tablet Commonly known as: Roxicodone  Take 1 tablet (5 mg total) by mouth every 8 (eight) hours as needed for breakthrough pain or severe pain (pain  score 7-10).        Contact information for follow-up providers     Tonita Frater, MD Follow up in 2 week(s).   Specialties: Orthopedic Surgery, Sports Medicine Contact information: [address] [place] [address] [phone]              Contact information for after-discharge care     Destination     [facility] .   Service: Skilled Nursing Contact information: [address] [place]  [address] [phone]                    Allergies  Allergen Reactions   Cortisone Other (See Comments)    Fluid retention and hives    Consultations: Orthopedics   Procedures/Studies: DG FEMUR PORT, MIN 2 VIEWS RIGHT Result Date: 03/19/2024 CLINICAL DATA:  Postop EXAM: RIGHT FEMUR PORTABLE 2 VIEW COMPARISON:  03/19/2024 FINDINGS: Removal of threaded screws from the proximal femur. Interval intramedullary rod and screw fixation of proximal subtrochanteric fracture with decreased angulation and displacement. Gas in the soft tissues consistent with recent surgery IMPRESSION: Interval ORIF of proximal femoral fracture. Electronically Signed   By: Esmeralda Hedge M.D.   On: 03/19/2024 20:27   DG Pelvis Portable Result Date: 03/19/2024 CLINICAL DATA:  Right femoral fracture postop. EXAM: PORTABLE PELVIS 1 VIEWS COMPARISON:  05/29/2016. FINDINGS: Status post left total hip arthroplasty. Postop ORIF right femoral neck. Pelvic ring intact. No osteolytic or osteoblastic changes. IMPRESSION: Postoperative changes. No acute osseous abnormalities. Electronically Signed   By: Sydell Eva M.D.   On: 03/19/2024 20:26   DG HIP UNILAT WITH PELVIS 2-3 VIEWS RIGHT Result Date: 03/19/2024 CLINICAL DATA:  Inter trochanteric right femur fracture. EXAM: DG HIP (WITH OR WITHOUT PELVIS) 2-3V RIGHT COMPARISON:  Preoperative imaging FINDINGS: Eight fluoroscopic spot views of the right hip submitted from the operating room. Femoral intramedullary  nail with trans trochanteric and distal locking screw fixation traverse proximal femur fracture. The previous screws traversing the femoral neck fracture have been removed. Fluoroscopy time 2 minute 7 seconds. Dose 31.71 mGy IMPRESSION: Intraoperative fluoroscopy during right femur fracture fixation. Electronically Signed   By: Chadwick Colonel M.D.   On: 03/19/2024 17:07   DG C-Arm 1-60 Min-No Report Result Date: 03/19/2024 Fluoroscopy was utilized by the requesting physician.  No radiographic interpretation.   DG C-Arm 1-60 Min-No Report Result Date: 03/19/2024 Fluoroscopy was utilized by the requesting physician.  No radiographic interpretation.   DG Femur Min 2 Views Right Result Date: 03/19/2024 CLINICAL DATA:  Status post fall. EXAM: RIGHT FEMUR 2 VIEWS COMPARISON:  03/03/2024 FINDINGS: Three screws from previous ORIF of right femoral neck fracture are again noted. There is a new, acute fracture involving the proximal right femur at the level of the lesser trochanter. Slight impaction and medial angulation of the distal fracture fragments noted. The distal right femur appears intact. IMPRESSION: Acute fracture involving the proximal right femur at the  level of the lesser trochanter. Electronically Signed   By: Kimberley Penman M.D.   On: 03/19/2024 07:20   DG Hip Unilat W or Wo Pelvis 2-3 Views Right Result Date: 03/19/2024 CLINICAL DATA:  Hip pain EXAM: DG HIP (WITH OR WITHOUT PELVIS) 2-3V RIGHT COMPARISON:  03/03/2024 FINDINGS: There are 3 cannulated screws are noted traversing the right femoral neck fracture. There is a new, acute fracture involving the proximal right femur just below the level of the lesser trochanter. Medial angulation of the distal fracture fragments identified. Status post left total hip arthroplasty. IMPRESSION: Acute fracture involving the proximal right femur just below the level of the lesser trochanter. Electronically Signed   By: Kimberley Penman M.D.   On: 03/19/2024  07:19   DG HIP UNILAT WITH PELVIS 2-3 VIEWS RIGHT Result Date: 03/03/2024 CLINICAL DATA:  Postop. EXAM: DG HIP (WITH OR WITHOUT PELVIS) 2-3V RIGHT COMPARISON:  Preoperative imaging FINDINGS: Three screws traverse the right femoral neck fracture. Unchanged fracture alignment. No new fracture. Recent postsurgical change includes edema in the soft tissues. Previous left hip arthroplasty is partially included. IMPRESSION: ORIF of right femoral neck fracture. Electronically Signed   By: Chadwick Colonel M.D.   On: 03/03/2024 15:14   DG HIP UNILAT WITH PELVIS 2-3 VIEWS RIGHT Result Date: 03/03/2024 CLINICAL DATA:  Right hip fracture. EXAM: DG HIP (WITH OR WITHOUT PELVIS) 2-3V RIGHT COMPARISON:  Preoperative imaging FINDINGS: Four fluoroscopic spot views of the right hip submitted from the operating room. Three screws traverse femoral neck fracture. Fluoroscopy time 1 minutes 14 seconds. Dose 19.51 mGy. IMPRESSION: Intraoperative fluoroscopy during right femoral neck fracture fixation. Electronically Signed   By: Chadwick Colonel M.D.   On: 03/03/2024 14:53   DG C-Arm 1-60 Min-No Report Result Date: 03/03/2024 Fluoroscopy was utilized by the requesting physician.  No radiographic interpretation.   DG C-Arm 1-60 Min-No Report Result Date: 03/03/2024 Fluoroscopy was utilized by the requesting physician.  No radiographic interpretation.   CT Hip Right Wo Contrast Result Date: 03/02/2024 CLINICAL DATA:  Right hip pain after fall. EXAM: CT OF THE RIGHT HIP WITHOUT CONTRAST TECHNIQUE: Multidetector CT imaging of the right hip was performed according to the standard protocol. Multiplanar CT image reconstructions were also generated. RADIATION DOSE REDUCTION: This exam was performed according to the departmental dose-optimization program which includes automated exposure control, adjustment of the mA and/or kV according to patient size and/or use of iterative reconstruction technique. COMPARISON:  Right hip radiographs  dated 03/02/2024. FINDINGS: Bones/Joint/Cartilage There is a nondisplaced mildly impacted fracture of the right femoral head and neck junction, predominantly laterally (series 6, images 65-74 and series 3, images 56 and 64). The remainder of the visualized bones are intact. No dislocation. Sacroiliac joints and pubic symphysis are intact. Muscles and Tendons No intramuscular fluid collection. Soft tissue No fluid collection or hematoma. Visualized intrapelvic contents are unremarkable. Atherosclerotic vascular calcification. IMPRESSION: Nondisplaced mildly impacted fracture of the right femoral head and neck junction, most predominant laterally. Electronically Signed   By: Mannie Seek M.D.   On: 03/02/2024 13:17   DG Hip Unilat  With Pelvis 2-3 Views Right Result Date: 03/02/2024 CLINICAL DATA:  Patient fell last night.  Right hip pain. EXAM: DG HIP (WITH OR WITHOUT PELVIS) 2-3V RIGHT COMPARISON:  12/06/2022 FINDINGS: Bones are diffusely demineralized. SI joints and symphysis pubis unremarkable. No evidence for pubic ramus fracture. Status post left total hip replacement, incompletely visualized. AP and frog-leg lateral views of the right hip show evidence of  impaction at the junction of the femoral neck and femoral head laterally, new in the interval since prior study. No discrete fracture line is evident by x-ray. IMPRESSION: Evidence for impaction at the junction of the femoral neck and femoral head laterally, suspicious for fracture. No discrete fracture line is evident by x-ray. CT may be able to definitively characterize this although in the setting of incomplete fracture, MRI would be a more sensitive study with which to confirm. Electronically Signed   By: Donnal Fusi M.D.   On: 03/02/2024 11:07     Discharge Exam: Vitals:   03/20/24 1945 03/21/24 0335  BP: 120/67 101/60  Pulse: 88 82  Resp: 16 16  Temp: 99.5 F (37.5 C) 98.8 F (37.1 C)  SpO2: 96% 95%   Vitals:   03/20/24 0516  03/20/24 1417 03/20/24 1945 03/21/24 0335  BP: (!) 108/47 112/63 120/67 101/60  Pulse: 76 78 88 82  Resp: 17 15 16 16   Temp: 99.5 F (37.5 C) 99.3 F (37.4 C) 99.5 F (37.5 C) 98.8 F (37.1 C)  TempSrc: Oral Oral Oral Oral  SpO2: 92% 94% 96% 95%    General: Pt is alert, awake, not in acute distress Cardiovascular: RRR, S1/S2 +, no rubs, no gallops Respiratory: CTA bilaterally, no wheezing, no rhonchi Abdominal: Soft, NT, ND, bowel sounds + Extremities: no edema, no cyanosis    The results of significant diagnostics from this hospitalization (including imaging, microbiology, ancillary and laboratory) are listed below for reference.     Microbiology: No results found for this or any previous visit (from the past 240 hours).   Labs: BNP (last 3 results) No results for input(s): "BNP" in the last 8760 hours. Basic Metabolic Panel: Recent Labs  Lab 03/19/24 0839 03/19/24 1337 03/19/24 1734 03/20/24 0438 03/21/24 0455  NA 122* 125* 126* 132* 131*  K 3.2* 3.5  --  4.3 4.3  CL 86* 90*  --  98 98  CO2 22  --   --  24 26  GLUCOSE 128* 133*  --  108* 124*  BUN 6* 5*  --  8 8  CREATININE 0.75 0.70  --  0.75 0.85  CALCIUM  8.8*  --   --  8.1* 8.2*  MG  --   --   --  1.7 1.8   Liver Function Tests: No results for input(s): "AST", "ALT", "ALKPHOS", "BILITOT", "PROT", "ALBUMIN" in the last 168 hours. No results for input(s): "LIPASE", "AMYLASE" in the last 168 hours. No results for input(s): "AMMONIA" in the last 168 hours. CBC: Recent Labs  Lab 03/19/24 0839 03/19/24 1337 03/20/24 0438 03/21/24 0455  WBC 10.6*  --  7.6 8.2  NEUTROABS 9.1*  --   --   --   HGB 11.6* 11.9* 9.6* 9.0*  HCT 32.6* 35.0* 28.8* 29.1*  MCV 92.1  --  97.0 103.2*  PLT 393  --  332 273   Cardiac Enzymes: No results for input(s): "CKTOTAL", "CKMB", "CKMBINDEX", "TROPONINI" in the last 168 hours. BNP: Invalid input(s): "POCBNP" CBG: No results for input(s): "GLUCAP" in the last 168  hours. D-Dimer No results for input(s): "DDIMER" in the last 72 hours. Hgb A1c No results for input(s): "HGBA1C" in the last 72 hours. Lipid Profile No results for input(s): "CHOL", "HDL", "LDLCALC", "TRIG", "CHOLHDL", "LDLDIRECT" in the last 72 hours. Thyroid function studies Recent Labs    03/19/24 1012  TSH 0.964   Anemia work up No results for input(s): "VITAMINB12", "FOLATE", "FERRITIN", "TIBC", "IRON", "RETICCTPCT" in  the last 72 hours. Urinalysis    Component Value Date/Time   COLORURINE AMBER (A) 05/01/2019 1534   APPEARANCEUR CLOUDY (A) 05/01/2019 1534   LABSPEC 1.021 05/01/2019 1534   PHURINE 5.0 05/01/2019 1534   GLUCOSEU NEGATIVE 05/01/2019 1534   HGBUR SMALL (A) 05/01/2019 1534   BILIRUBINUR NEGATIVE 05/01/2019 1534   KETONESUR NEGATIVE 05/01/2019 1534   PROTEINUR 100 (A) 05/01/2019 1534   NITRITE NEGATIVE 05/01/2019 1534   LEUKOCYTESUR NEGATIVE 05/01/2019 1534   Sepsis Labs Recent Labs  Lab 03/19/24 0839 03/20/24 0438 03/21/24 0455  WBC 10.6* 7.6 8.2   Microbiology No results found for this or any previous visit (from the past 240 hours).   Time coordinating discharge: 35 minutes  SIGNED:   Cornelius Dill, DO [facility] 03/21/2024, 10:22 AM  If 7PM-7AM, please contact night-coverage [email or web address]

## 2024-03-22 ENCOUNTER — Non-Acute Institutional Stay (SKILLED_NURSING_FACILITY): Payer: Self-pay | Admitting: Adult Health

## 2024-03-22 ENCOUNTER — Encounter: Payer: Self-pay | Admitting: Adult Health

## 2024-03-22 DIAGNOSIS — S72001S Fracture of unspecified part of neck of right femur, sequela: Secondary | ICD-10-CM

## 2024-03-22 DIAGNOSIS — E785 Hyperlipidemia, unspecified: Secondary | ICD-10-CM

## 2024-03-22 DIAGNOSIS — J410 Simple chronic bronchitis: Secondary | ICD-10-CM

## 2024-03-22 DIAGNOSIS — I10 Essential (primary) hypertension: Secondary | ICD-10-CM

## 2024-03-22 DIAGNOSIS — D62 Acute posthemorrhagic anemia: Secondary | ICD-10-CM

## 2024-03-22 DIAGNOSIS — S72001D Fracture of unspecified part of neck of right femur, subsequent encounter for closed fracture with routine healing: Secondary | ICD-10-CM

## 2024-03-22 DIAGNOSIS — E871 Hypo-osmolality and hyponatremia: Secondary | ICD-10-CM

## 2024-03-22 DIAGNOSIS — I700 Atherosclerosis of aorta: Secondary | ICD-10-CM

## 2024-03-22 DIAGNOSIS — E876 Hypokalemia: Secondary | ICD-10-CM

## 2024-03-22 DIAGNOSIS — E039 Hypothyroidism, unspecified: Secondary | ICD-10-CM

## 2024-03-22 NOTE — Progress Notes (Signed)
 Location:  [facility] Room Number: 109 Place of Service:  SNF (31)   CODE STATUS: full  Allergies  Allergen Reactions   Cortisone Other (See Comments)    Fluid retention and hives    Chief Complaint  Patient presents with   Hospitalization Follow-up    HPI:  She is  68 year old woman who has been hospitalized from 03-19-24 through 03-21-24. Her past medical history includes: CAD: hypertension; hyperlipidemia and a recent right hip fracture with repair. She was discharged originally on 03-04-24. She presented to the ED with right hip pain while trying to go to the bathroom. She nearly fell; but her spouse did catch her. She was found to have a repeat acuate right hip fracture. She underwent a cephalo medullary nail to the right subtrochanteric femur on 03-19-24. It was recommended that she go to SNF for further rehab. She is here for short term rehab with her goal to return back home. She will continue to be followed for her chronic illnesses including:  Primary hypertension: Hyponatremia: Simple chronic bronchitis:Hypokalemia    Past Medical History:  Diagnosis Date   Coronary artery disease    Endometriosis    Hyperlipidemia    Hypertension    Hypotension    Ruptured cervical disc    Skin cancer     Past Surgical History:  Procedure Laterality Date   ABDOMINAL HYSTERECTOMY     ANTERIOR APPROACH HEMI HIP ARTHROPLASTY Left 05/29/2016   Procedure: ANTERIOR APPROACH TOTAL HIP ARTHROPLASTY ;  Surgeon: Arnie Lao, MD;  Location: [facility];  Service: Orthopedics;  Laterality: Left;   ARTERY BIOPSY Right 05/03/2019   Procedure: MINOR BIOPSY TEMPORAL ARTERY;  Surgeon: Awilda Bogus, MD;  Location: [facility];  Service: General;  Laterality: Right;   CARDIAC CATHETERIZATION     CATARACT EXTRACTION W/PHACO Right 06/18/2022   Procedure: CATARACT EXTRACTION PHACO AND INTRAOCULAR LENS PLACEMENT (IOC);  Surgeon: Tarri Farm, MD;  Location: [facility];  Service:  Ophthalmology;  Laterality: Right;  CDE 115.90   CATARACT EXTRACTION W/PHACO Left 07/02/2022   Procedure: CATARACT EXTRACTION PHACO AND INTRAOCULAR LENS PLACEMENT (IOC);  Surgeon: Tarri Farm, MD;  Location: [facility];  Service: Ophthalmology;  Laterality: Left;  CDE: 5.85   CHOLECYSTECTOMY     HARDWARE REMOVAL Right 03/19/2024   Procedure: REMOVAL, HARDWARE;  Surgeon: Tonita Frater, MD;  Location: [facility];  Service: Orthopedics;  Laterality: Right;  Removal of 3 cannulated screws   HIP PINNING,CANNULATED Right 03/03/2024   Procedure: FIXATION, FEMUR, NECK, PERCUTANEOUS, USING SCREW;  Surgeon: Tonita Frater, MD;  Location: [facility];  Service: Orthopedics;  Laterality: Right;   INTRAMEDULLARY (IM) NAIL INTERTROCHANTERIC Right 03/19/2024   Procedure: FIXATION, FRACTURE, INTERTROCHANTERIC, WITH INTRAMEDULLARY ROD;  Surgeon: Tonita Frater, MD;  Location: [facility];  Service: Orthopedics;  Laterality: Right;   TONSILLECTOMY      Social History   Socioeconomic History   Marital status: Married    Spouse name: Not on file   Number of children: Not on file   Years of education: Not on file   Highest education level: Not on file  Occupational History   Not on file  Tobacco Use   Smoking status: Former    Current packs/day: 0.00    Types: Cigarettes    Quit date: 05/01/2019    Years since quitting: 4.8   Smokeless tobacco: Never  Vaping Use   Vaping status: Never Used  Substance and Sexual Activity  Alcohol use: Yes    Comment: occassional   Drug use: Not Currently   Sexual activity: Not on file  Other Topics Concern   Not on file  Social History Narrative   Not on file   Social Drivers of Health   Financial Resource Strain: Not on file  Food Insecurity: No Food Insecurity (03/19/2024)   Hunger Vital Sign    Worried About Running Out of Food in the Last Year: Never true    Ran Out of Food in the Last Year: Never true  Transportation Needs: No Transportation Needs (03/19/2024)   PRAPARE -  Administrator, Civil Service (Medical): No    Lack of Transportation (Non-Medical): No  Physical Activity: Not on file  Stress: Not on file  Social Connections: Moderately Isolated (03/19/2024)   Social Connection and Isolation Panel [NHANES]    Frequency of Communication with Friends and Family: More than three times a week    Frequency of Social Gatherings with Friends and Family: More than three times a week    Attends Religious Services: Never    Database administrator or Organizations: No    Attends Banker Meetings: Never    Marital Status: Married  Catering manager Violence: Not At Risk (03/19/2024)   Humiliation, Afraid, Rape, and Kick questionnaire    Fear of Current or Ex-Partner: No    Emotionally Abused: No    Physically Abused: No    Sexually Abused: No   Family History  Problem Relation Age of Onset   Cancer Mother    Cancer Father       VITAL SIGNS BP 118/74   Pulse 72   Temp (!) 97.2 F (36.2 C)   Resp 20   Ht 5\' 4"  (1.626 m)   Wt 167 lb 3.2 oz (75.8 kg)   SpO2 98%   BMI 28.70 kg/m   Outpatient Encounter Medications as of 03/22/2024  Medication Sig   cyanocobalamin 1000 MCG tablet Take 1,000 mcg by mouth daily.   albuterol  (PROVENTIL ) (2.5 MG/3ML) 0.083% nebulizer solution Take 2.5 mg by nebulization 3 (three) times daily as needed for wheezing or shortness of breath.   albuterol  (VENTOLIN  HFA) 108 (90 Base) MCG/ACT inhaler Inhale 2 puffs into the lungs every 6 (six) hours as needed for wheezing or shortness of breath.   aspirin  EC 81 MG tablet Take 1 tablet (81 mg total) by mouth 2 (two) times daily. Swallow whole.   atorvastatin  (LIPITOR) 20 MG tablet Take 1 tablet (20 mg total) by mouth daily.   benzonatate  (TESSALON ) 100 MG capsule Take 100 mg by mouth daily.   ezetimibe (ZETIA) 10 MG tablet Take 10 mg by mouth daily.   fluticasone  (FLONASE ) 50 MCG/ACT nasal spray Place 1 spray into both nostrils daily.   levothyroxine   (SYNTHROID ) 25 MCG tablet Take 25 mcg by mouth every morning.   montelukast  (SINGULAIR ) 10 MG tablet Take 10 mg by mouth daily.   oxyCODONE  (ROXICODONE ) 5 MG immediate release tablet Take 1 tablet (5 mg total) by mouth every 8 (eight) hours as needed for breakthrough pain or severe pain (pain score 7-10).   [DISCONTINUED] cyanocobalamin (VITAMIN B12) 500 MCG tablet Take 500 mcg by mouth daily.   [DISCONTINUED] niacin (VITAMIN B3) 500 MG tablet Take 500 mg by mouth at bedtime.   [DISCONTINUED] Omega-3 Fatty Acids (FISH OIL) 1000 MG CAPS Take 1 capsule by mouth daily.   No facility-administered encounter medications on file as of 03/22/2024.  SIGNIFICANT DIAGNOSTIC EXAMS  TODAY  03-19-24: wbc 10.6; hgb 11.6; hct 32.6; mcv 92.1 plt 393; glucose 128; bun 6; creat 0.75; k+ 3.2; na++ 122; ca 8.8; gfr >60; tsh 0.964 03-21-24: wbc 8.2; hgb 9.0; hct 29.1; mcv 103.2 plt 273; glucose 124; bun 8; creat 0.85; k+ 4.3; na++ 131; ca 8.2; gfr >60; mag 1.8   Review of Systems  Constitutional:  Negative for malaise/fatigue.  Respiratory:  Negative for cough and shortness of breath.   Cardiovascular:  Negative for chest pain, palpitations and leg swelling.  Gastrointestinal:  Negative for abdominal pain, constipation and heartburn.  Musculoskeletal:  Negative for back pain, joint pain and myalgias.  Skin: Negative.   Neurological:  Negative for dizziness.  Psychiatric/Behavioral:  The patient is not nervous/anxious.    Physical Exam Constitutional:      General: She is not in acute distress.    Appearance: She is well-developed. She is not diaphoretic.  Neck:     Thyroid: No thyromegaly.     Comments:   Cardiovascular:     Rate and Rhythm: Normal rate and regular rhythm.     Pulses: Normal pulses.     Heart sounds: Normal heart sounds.  Pulmonary:     Effort: Pulmonary effort is normal. No respiratory distress.     Breath sounds: Normal breath sounds.  Abdominal:     General: Bowel sounds are  normal. There is no distension.     Palpations: Abdomen is soft.     Tenderness: There is no abdominal tenderness.  Musculoskeletal:        General: Normal range of motion.     Cervical back: Neck supple.     Right lower leg: No edema.     Left lower leg: No edema.     Comments: Is status post second right hip fracture   Lymphadenopathy:     Cervical: No cervical adenopathy.  Skin:    General: Skin is warm and dry.     Comments: Incision line without signs of infection present   Neurological:     Mental Status: She is alert and oriented to person, place, and time.  Psychiatric:        Mood and Affect: Mood normal.   ASSESSMENT/ PLAN:  TODAY  Closed right hip fracture sequela: is status post nail. Will continue therapy as directed to improve upon her level of independence with her adls. She has oxycodone  5 mg every 8 hours as needed through 03-28-24. Will continue asa 81 mg twice daily for 6 weeks then revert back to daily   2. Primary hypertension: b/p 118/74: will monitor  3. Hyponatremia: upon admission 122; upon discharge 131; her hydrochlorothiazide has been stopped  4. Simple chronic bronchitis: will continue singular 10 mg daily; flonase  daily; tessalon  100 mg daily; albuterol  neb or inhaler every 6 hours as needed  5. Hypokalemia: k+ 4.3 will monitor   6. Hyperlipidemia unspecified hyperlipidemia type: will continue lipitor 20 mg daily and zetia 10 mg daily   7. Acquired hypothyroidism: tsh 0.964 will continue 25 mcg daily   8. Aortic atherosclerosis: (ct 05-01-19) is on statin   9. Acute blood loss anemia: hgb 9.0 will monitor   Will repeat bmp and hgb/hct    Britt Candle NP [facility]   call [phone]

## 2024-03-23 ENCOUNTER — Non-Acute Institutional Stay (SKILLED_NURSING_FACILITY): Payer: Self-pay | Admitting: Internal Medicine

## 2024-03-23 ENCOUNTER — Encounter: Payer: Self-pay | Admitting: Internal Medicine

## 2024-03-23 DIAGNOSIS — D62 Acute posthemorrhagic anemia: Secondary | ICD-10-CM

## 2024-03-23 DIAGNOSIS — S72001D Fracture of unspecified part of neck of right femur, subsequent encounter for closed fracture with routine healing: Secondary | ICD-10-CM

## 2024-03-23 DIAGNOSIS — E039 Hypothyroidism, unspecified: Secondary | ICD-10-CM

## 2024-03-23 DIAGNOSIS — I10 Essential (primary) hypertension: Secondary | ICD-10-CM

## 2024-03-23 DIAGNOSIS — E871 Hypo-osmolality and hyponatremia: Secondary | ICD-10-CM | POA: Diagnosis not present

## 2024-03-23 NOTE — Progress Notes (Unsigned)
 NURSING HOME LOCATION:  [facility] ROOM NUMBER: 109 D   CODE STATUS:  Full Code  PCP:  Brantley Caldwell MD  This is a comprehensive admission note to this SNFperformed on this date less than 30 days from date of admission. Included are preadmission medical/surgical history; reconciled medication list; family history; social history and comprehensive review of systems.  Corrections and additions to the records were documented. Comprehensive physical exam was also performed. Additionally a clinical summary was entered for each active diagnosis pertinent to this admission in the Problem List to enhance continuity of care.  HPI: She was hospitalized 4/21 - 03/21/2024 with repeat acute right hip fracture.  She had sustained a repeat fracture while ambulating; her husband was able to support her and prevent a fall.  There was no cardiac or neurologic prodrome.   This is in context of prior surgical repair of closed right hip fracture sustained a mechanical fall for which she was hospitalized 4/4 - 4/6.   Dr Ernesta Heading completed cephalomedullary nailing to the right subtrochanteric femur fracture 1/21.  Aspirin  81 mg twice daily was prescribed for DVT prophylaxis for 6 weeks postop while hospitalized hyponatremia was documented with nadir sodium of 126. Final sodium was 131.Her home HCTZ was held pending repeat BMP in 1 week. While hospitalized glucoses ranged from 108 up to 133.  The only A1c on record was 5.6% on 06/05/2019.  Macrocytic anemia was present with an MCV of 103.2.  Nadir H/H was 9.0/29.1. PT/OT consulted and recommended SNF placement for rehab.  Past medical and surgical history: Includes CAD, history of endometriosis, dyslipidemia, essential hypertension, and history of skin cancer. Surgeries and procedures include abdominal hysterectomy cardiac catheterization, and cholecystectomy.  Family history: reviewed, both parents have malignancy.  Social  history:occasional alcohol intake; former smoker.   Review of systems: She states that she is "feeling great" and only "a little sore."  She was unaware of the hyponatremia and the recommendation to temporarily hold the HCTZ until sodium could be rechecked.  Constitutional: No fever, significant weight change, fatigue  Eyes: No redness, discharge, pain, vision change ENT/mouth: No nasal congestion, purulent discharge, earache, change in hearing, sore throat  Cardiovascular: No chest pain, palpitations, paroxysmal nocturnal dyspnea, claudication, edema  Respiratory: No cough, sputum production, hemoptysis, DOE, significant snoring, apnea Gastrointestinal: No heartburn, dysphagia, abdominal pain, nausea /vomiting, rectal bleeding, melena, change in bowels Genitourinary: No dysuria, hematuria, pyuria, incontinence, nocturia Dermatologic: No rash, pruritus, change in appearance of skin Neurologic: No dizziness, headache, syncope, seizures, numbness, tingling Psychiatric: No significant anxiety, depression, insomnia, anorexia Endocrine: No change in hair/skin/nails, excessive thirst, excessive hunger, excessive urination  Hematologic/lymphatic: No significant bruising, lymphadenopathy, abnormal bleeding Allergy/immunology: No itchy/watery eyes, significant sneezing, urticaria, angioedema  Physical exam:  Pertinent or positive findings: Facies are somewhat weathered.  She is edentulous.  Breath sounds are decreased.  There is slight increase in both 1st and 2nd heart sounds.  Pedal pulses are decreased.  She has trace edema at the sock line.  She has multiple tattoos of the thorax and extremities.  General appearance:  no acute distress, increased work of breathing is present.   Lymphatic: No lymphadenopathy about the head, neck, axilla. Eyes: No conjunctival inflammation or lid edema is present. There is no scleral icterus. Ears:  External ear exam shows no significant lesions or deformities.    Nose:  External nasal examination shows no deformity or inflammation. Nasal mucosa are pink and moist without lesions, exudates Oral exam:  Lips and gums are healthy appearing.There is no oropharyngeal erythema or exudate. Neck:  No thyromegaly, masses, tenderness noted.    Heart:  Normal rate and regular rhythm. S1 and S2 normal without gallop, murmur, click, rub.  Lungs: Chest clear to auscultation without wheezes, rhonchi, rales, rubs. Abdomen: Bowel sounds are normal.  Abdomen is soft and nontender with no organomegaly, hernias, masses. GU: Deferred  Extremities:  No cyanosis, clubbing, edema. Neurologic exam:  Strength equal  in upper & lower extremities. Balance, Rhomberg, finger to nose testing could not be completed due to clinical state Deep tendon reflexes are equal Skin: Warm & dry w/o tenting. No significant lesions or rash.  See clinical summary under each active problem in the Problem List with associated updated therapeutic plan

## 2024-03-23 NOTE — Patient Instructions (Addendum)
 See assessment and plan under each diagnosis in the problem list and acutely for this visit:  Acute blood loss anemia Nadir H/H 9.0/29.1 during hospitalization 4/21 - 03/21/2024 for repeat hip fracture.  Macrocytosis present with an MCV of 103.2.  She is on B12 supplementation.  B12 level can be monitored in 6-8 weeks to verify adequate absorption.  Acquired hypothyroidism TSH is low normal at 0.964 on low-dose L-thyroxine.  Recheck in 3 months to verify stability. Any pre-existing osteoporosis could be exacerbated by TSH suppression.  Hyponatremia While hospitalized with repeat hip fracture  4/21 - 03/21/2024 Nadir sodium 126.  Final sodium 131.  HCTZ held with repeat BMP in 1 week. I informed her of the hyponatremia and recommended that she discuss alternative antihypertensive to HCTZ with her PCP.  Hip fracture (HCC) PT/OT and SNF as tolerated.  Orthopedic follow-up as scheduled.

## 2024-03-24 NOTE — Assessment & Plan Note (Signed)
 TSH is low normal at 0.964 on low-dose L-thyroxine.  Recheck in 3 months to verify stability.

## 2024-03-24 NOTE — Assessment & Plan Note (Signed)
 PT/OT and SNF as tolerated.  Orthopedic follow-up as scheduled.

## 2024-03-24 NOTE — Assessment & Plan Note (Signed)
 While hospitalized with repeat hip fracture in 4/21 - 03/21/2024 Nadir sodium 126.  Final sodium 131.  HCTZ held with repeat BMP in 1 week. I informed her of the hyponatremia and recommended that she discuss it with her PCP.

## 2024-03-24 NOTE — Assessment & Plan Note (Signed)
 Nadir H/H 9.0/29.1 during hospitalization 4/21 - 03/21/2024 for repeat hip fracture.  Macrocytosis present with an MCV of 103.2.  She is on B12 supplementation.  B12 level can be monitored in 6-8 weeks to verify adequate absorption.

## 2024-03-25 NOTE — Assessment & Plan Note (Signed)
 Systolic slightly elevated; BP average will be determined & antihypertensive regimen adjusted based on that value. Repeat BMET will determine whether hyponatremia related to hydrochlorothiazide & alternative therapy clinically appropriate.[name]

## 2024-03-26 ENCOUNTER — Encounter: Payer: Self-pay | Admitting: Adult Health

## 2024-03-26 ENCOUNTER — Non-Acute Institutional Stay (SKILLED_NURSING_FACILITY): Payer: Self-pay | Admitting: Adult Health

## 2024-03-26 ENCOUNTER — Other Ambulatory Visit (HOSPITAL_COMMUNITY)
Admission: RE | Admit: 2024-03-26 | Discharge: 2024-03-26 | Disposition: A | Discharge status: Home / Self Care | Source: Skilled Nursing Facility | Attending: Adult Health | Admitting: Adult Health

## 2024-03-26 DIAGNOSIS — S7221XD Displaced subtrochanteric fracture of right femur, subsequent encounter for closed fracture with routine healing: Secondary | ICD-10-CM | POA: Insufficient documentation

## 2024-03-26 DIAGNOSIS — D62 Acute posthemorrhagic anemia: Secondary | ICD-10-CM | POA: Diagnosis not present

## 2024-03-26 LAB — BASIC METABOLIC PANEL WITH GFR
Anion gap: 9 (ref 5–15)
BUN: 6 mg/dL — ABNORMAL LOW (ref 8–23)
CO2: 26 mmol/L (ref 22–32)
Calcium: 8.8 mg/dL — ABNORMAL LOW (ref 8.9–10.3)
Chloride: 100 mmol/L (ref 98–111)
Creatinine, Ser: 0.82 mg/dL (ref 0.44–1.00)
GFR, Estimated: >60 mL/min (ref >60)
Glucose, Bld: 94 mg/dL (ref 70–99)
Potassium: 4.1 mmol/L (ref 3.5–5.1)
Sodium: 135 mmol/L (ref 135–145)

## 2024-03-26 LAB — HEMOGLOBIN AND HEMATOCRIT, BLOOD
HCT: 26.2 % — ABNORMAL LOW (ref 36.0–46.0)
Hemoglobin: 8.5 g/dL — ABNORMAL LOW (ref 12.0–15.0)

## 2024-03-26 NOTE — Progress Notes (Signed)
 Location:  [facility] Room Number: 109 Place of Service:  SNF (31)   CODE STATUS: full   Allergies  Allergen Reactions   Cortisone Other (See Comments)    Fluid retention and hives    Chief Complaint  Patient presents with   Acute Visit    Follow up lab results     HPI:  Her hgb is 8.5.  She denies any blood in her stools. She has had 2 right hip fractures in the past month. She denies worsening weakness.   Past Medical History:  Diagnosis Date   Coronary artery disease    Endometriosis    Hyperlipidemia    Hypertension    Hypotension    Ruptured cervical disc    Skin cancer     Past Surgical History:  Procedure Laterality Date   ABDOMINAL HYSTERECTOMY     ANTERIOR APPROACH HEMI HIP ARTHROPLASTY Left 05/29/2016   Procedure: ANTERIOR APPROACH TOTAL HIP ARTHROPLASTY ;  Surgeon: Arnie Lao, MD;  Location: [facility] OR;  Service: Orthopedics;  Laterality: Left;   ARTERY BIOPSY Right 05/03/2019   Procedure: MINOR BIOPSY TEMPORAL ARTERY;  Surgeon: Awilda Bogus, MD;  Location: [facility];  Service: General;  Laterality: Right;   CARDIAC CATHETERIZATION     CATARACT EXTRACTION W/PHACO Right 06/18/2022   Procedure: CATARACT EXTRACTION PHACO AND INTRAOCULAR LENS PLACEMENT (IOC);  Surgeon: Tarri Farm, MD;  Location: [facility];  Service: Ophthalmology;  Laterality: Right;  CDE 115.90   CATARACT EXTRACTION W/PHACO Left 07/02/2022   Procedure: CATARACT EXTRACTION PHACO AND INTRAOCULAR LENS PLACEMENT (IOC);  Surgeon: Tarri Farm, MD;  Location: [facility];  Service: Ophthalmology;  Laterality: Left;  CDE: 5.85   CHOLECYSTECTOMY     HARDWARE REMOVAL Right 03/19/2024   Procedure: REMOVAL, HARDWARE;  Surgeon: Tonita Frater, MD;  Location: [facility];  Service: Orthopedics;  Laterality: Right;  Removal of 3 cannulated screws   HIP PINNING,CANNULATED Right 03/03/2024   Procedure: FIXATION, FEMUR, NECK, PERCUTANEOUS, USING SCREW;  Surgeon: Tonita Frater, MD;  Location:  [facility];  Service: Orthopedics;  Laterality: Right;   INTRAMEDULLARY (IM) NAIL INTERTROCHANTERIC Right 03/19/2024   Procedure: FIXATION, FRACTURE, INTERTROCHANTERIC, WITH INTRAMEDULLARY ROD;  Surgeon: Tonita Frater, MD;  Location: [facility];  Service: Orthopedics;  Laterality: Right;   TONSILLECTOMY      Social History   Socioeconomic History   Marital status: Married    Spouse name: Not on file   Number of children: Not on file   Years of education: Not on file   Highest education level: Not on file  Occupational History   Not on file  Tobacco Use   Smoking status: Former    Current packs/day: 0.00    Types: Cigarettes    Quit date: 05/01/2019    Years since quitting: 4.9   Smokeless tobacco: Never  Vaping Use   Vaping status: Never Used  Substance and Sexual Activity   Alcohol use: Yes    Comment: occassional   Drug use: Not Currently   Sexual activity: Not on file  Other Topics Concern   Not on file  Social History Narrative   Not on file   Social Drivers of Health   Financial Resource Strain: Not on file  Food Insecurity: No Food Insecurity (03/19/2024)   Hunger Vital Sign    Worried About Running Out of Food in the Last Year: Never true    Ran Out of Food in the Last Year: Never true  Transportation Needs: No Transportation Needs (03/19/2024)   PRAPARE - Administrator, Civil Service (Medical): No    Lack of Transportation (Non-Medical): No  Physical Activity: Not on file  Stress: Not on file  Social Connections: Moderately Isolated (03/19/2024)   Social Connection and Isolation Panel [NHANES]    Frequency of Communication with Friends and Family: More than three times a week    Frequency of Social Gatherings with Friends and Family: More than three times a week    Attends Religious Services: Never    Database administrator or Organizations: No    Attends Banker Meetings: Never    Marital Status: Married  Catering manager Violence: Not At  Risk (03/19/2024)   Humiliation, Afraid, Rape, and Kick questionnaire    Fear of Current or Ex-Partner: No    Emotionally Abused: No    Physically Abused: No    Sexually Abused: No   Family History  Problem Relation Age of Onset   Cancer Mother    Cancer Father       VITAL SIGNS BP 132/70   Pulse 74   Temp 98.2 F (36.8 C)   Resp 20   Ht 5\' 4"  (1.626 m)   Wt 169 lb 6.4 oz (76.8 kg)   SpO2 98%   BMI 29.08 kg/m   Outpatient Encounter Medications as of 03/26/2024  Medication Sig   albuterol  (PROVENTIL ) (2.5 MG/3ML) 0.083% nebulizer solution Take 2.5 mg by nebulization 3 (three) times daily as needed for wheezing or shortness of breath.   albuterol  (VENTOLIN  HFA) 108 (90 Base) MCG/ACT inhaler Inhale 2 puffs into the lungs every 6 (six) hours as needed for wheezing or shortness of breath.   aspirin  EC 81 MG tablet Take 1 tablet (81 mg total) by mouth 2 (two) times daily. Swallow whole.   atorvastatin  (LIPITOR) 20 MG tablet Take 1 tablet (20 mg total) by mouth daily.   benzonatate  (TESSALON ) 100 MG capsule Take 100 mg by mouth daily.   cyanocobalamin 1000 MCG tablet Take 1,000 mcg by mouth daily.   ezetimibe  (ZETIA ) 10 MG tablet Take 10 mg by mouth daily.   fluticasone  (FLONASE ) 50 MCG/ACT nasal spray Place 1 spray into both nostrils daily.   levothyroxine  (SYNTHROID ) 25 MCG tablet Take 25 mcg by mouth every morning.   montelukast  (SINGULAIR ) 10 MG tablet Take 10 mg by mouth daily.   oxyCODONE  (ROXICODONE ) 5 MG immediate release tablet Take 1 tablet (5 mg total) by mouth every 8 (eight) hours as needed for breakthrough pain or severe pain (pain score 7-10).   No facility-administered encounter medications on file as of 03/26/2024.     SIGNIFICANT DIAGNOSTIC EXAMS  PREVIOUS   03-19-24: wbc 10.6; hgb 11.6; hct 32.6; mcv 92.1 plt 393; glucose 128; bun 6; creat 0.75; k+ 3.2; na++ 122; ca 8.8; gfr >60; tsh 0.964 03-21-24: wbc 8.2; hgb 9.0; hct 29.1; mcv 103.2 plt 273; glucose 124;  bun 8; creat 0.85; k+ 4.3; na++ 131; ca 8.2; gfr >60; mag 1.8   TODAY  03-26-24: hgb 8.5/hct 26.2 glucose 94; bun 6; creat 0.82; k+ 4.1; na++ 135; ca 8.8; gfr >60; mag 1.8   Review of Systems  Constitutional:  Negative for malaise/fatigue.  Respiratory:  Negative for cough and shortness of breath.   Cardiovascular:  Negative for chest pain, palpitations and leg swelling.  Gastrointestinal:  Negative for abdominal pain, constipation and heartburn.  Musculoskeletal:  Negative for back pain, joint pain and myalgias.  Skin: Negative.   Neurological:  Negative for dizziness.  Psychiatric/Behavioral:  The patient is not nervous/anxious.    Physical Exam Constitutional:      General: She is not in acute distress.    Appearance: She is well-developed. She is not diaphoretic.  Neck:     Thyroid: No thyromegaly.  Cardiovascular:     Rate and Rhythm: Normal rate and regular rhythm.     Pulses: Normal pulses.     Heart sounds: Normal heart sounds.  Pulmonary:     Effort: Pulmonary effort is normal. No respiratory distress.     Breath sounds: Normal breath sounds.  Abdominal:     General: Bowel sounds are normal. There is no distension.     Palpations: Abdomen is soft.     Tenderness: There is no abdominal tenderness.  Musculoskeletal:        General: Normal range of motion.     Cervical back: Neck supple.     Right lower leg: No edema.     Left lower leg: No edema.     Comments:  Is status post second right hip fracture    Lymphadenopathy:     Cervical: No cervical adenopathy.  Skin:    General: Skin is warm and dry.     Comments: Incision line without signs of infection present  Neurological:     Mental Status: She is alert and oriented to person, place, and time.  Psychiatric:        Mood and Affect: Mood normal.      ASSESSMENT/ PLAN:  TODAY  Acute blood loss anemia: hgb 8.5; down from 9.0 will begin iron three days per week. Will repeat hgb/hct: 04-02-24   Britt Candle NP [facility]  call [phone]

## 2024-03-28 ENCOUNTER — Ambulatory Visit (INDEPENDENT_AMBULATORY_CARE_PROVIDER_SITE_OTHER): Admitting: Orthopedic Surgery

## 2024-03-28 ENCOUNTER — Other Ambulatory Visit (INDEPENDENT_AMBULATORY_CARE_PROVIDER_SITE_OTHER): Payer: Self-pay

## 2024-03-28 ENCOUNTER — Non-Acute Institutional Stay (SKILLED_NURSING_FACILITY): Payer: Self-pay | Admitting: Adult Health

## 2024-03-28 ENCOUNTER — Encounter: Payer: Self-pay | Admitting: Orthopedic Surgery

## 2024-03-28 ENCOUNTER — Other Ambulatory Visit: Payer: Self-pay | Admitting: Adult Health

## 2024-03-28 ENCOUNTER — Encounter: Payer: Self-pay | Admitting: Adult Health

## 2024-03-28 DIAGNOSIS — I700 Atherosclerosis of aorta: Secondary | ICD-10-CM | POA: Diagnosis not present

## 2024-03-28 DIAGNOSIS — I10 Essential (primary) hypertension: Secondary | ICD-10-CM

## 2024-03-28 DIAGNOSIS — D62 Acute posthemorrhagic anemia: Secondary | ICD-10-CM

## 2024-03-28 DIAGNOSIS — S72001S Fracture of unspecified part of neck of right femur, sequela: Secondary | ICD-10-CM | POA: Diagnosis not present

## 2024-03-28 DIAGNOSIS — S72001D Fracture of unspecified part of neck of right femur, subsequent encounter for closed fracture with routine healing: Secondary | ICD-10-CM

## 2024-03-28 DIAGNOSIS — S7221XA Displaced subtrochanteric fracture of right femur, initial encounter for closed fracture: Secondary | ICD-10-CM

## 2024-03-28 MED ORDER — EZETIMIBE 10 MG PO TABS: 10.0000 mg | ORAL_TABLET | Freq: Every day | ORAL | 0 refills | Status: AC

## 2024-03-28 MED ORDER — MONTELUKAST SODIUM 10 MG PO TABS: 10.0000 mg | ORAL_TABLET | Freq: Every day | ORAL | 0 refills | Status: AC

## 2024-03-28 MED ORDER — ALBUTEROL SULFATE (2.5 MG/3ML) 0.083% IN NEBU: 2.5000 mg | INHALATION_SOLUTION | Freq: Three times a day (TID) | RESPIRATORY_TRACT | 0 refills | Status: AC | PRN

## 2024-03-28 MED ORDER — BENZONATATE 100 MG PO CAPS: 100.0000 mg | ORAL_CAPSULE | Freq: Every day | ORAL | 0 refills | Status: AC

## 2024-03-28 MED ORDER — ALBUTEROL SULFATE HFA 108 (90 BASE) MCG/ACT IN AERS: 2.0000 | INHALATION_SPRAY | Freq: Four times a day (QID) | RESPIRATORY_TRACT | 0 refills | Status: AC | PRN

## 2024-03-28 MED ORDER — FERROUS SULFATE 325 (65 FE) MG PO TABS: 325.0000 mg | ORAL_TABLET | ORAL | 0 refills | Status: AC

## 2024-03-28 MED ORDER — ATORVASTATIN CALCIUM 20 MG PO TABS: 20.0000 mg | ORAL_TABLET | Freq: Every day | ORAL | 0 refills | Status: AC

## 2024-03-28 MED ORDER — LEVOTHYROXINE SODIUM 25 MCG PO TABS: 25.0000 ug | ORAL_TABLET | Freq: Every morning | ORAL | 0 refills | Status: AC

## 2024-03-28 NOTE — Progress Notes (Signed)
 Orthopaedic Postop Note  Assessment: [name] is a 68 y.o. female s/p cephalomedullary nail for Right subtrochanteric femur fracture (s/p CRPP R femoral neck fracture 03/03/24)  DOS: 03/19/2024  Plan: Len Quale removed, steri strips placed Continue with protective WBAT Continue with DVT prophylaxis for at least 6 weeks after surgery WBAT on the operative extremity Follow up in 4 weeks; call with any issues   Follow-up: Return in about 4 weeks (around 04/25/2024). XR at next visit: AP pelvis and Right femur  Subjective:  Chief Complaint  Patient presents with   Post-op Aneurysm Repair    03/19/24 right / improving     History of Present Illness: [name] is a 68 y.o. female who presents following the above stated procedure.  She is doing very well.  She was discharged to the [facility].  She states she is leaving Friday.  She is working well with therapy.  Pain is controlled.  No issues at this time.  Review of Systems: No fevers or chills No numbness or tingling No Chest Pain No shortness of breath   Objective: There were no vitals taken for this visit.  Physical Exam:  Alert and oriented.  No acute distress.  Seated in wheelchair.  She is able to stand without assistance.  Surgical incisions are healing well.  No surrounding erythema or drainage.  Able to maintain a straight leg raise.  Active motion intact in the TA/EHL.  Toes are warm and well-perfused.   IMAGING: I personally ordered and reviewed the following images:  XR of the Right femur and AP pelvis demonstrates a well positioned cephalomedullary nail.   The subtrochanteric femur fracture remains in stable position.  There is no evidence of implant subsidence.  No acute fractures are noted.  Screws are not backing out.  No concern for screw cutout.   Impression: Right intertrochanteric femur fracture in stable position without evidence of hardware failure or subsidence.   Tonita Frater,  MD 03/28/2024 3:02 PM

## 2024-03-28 NOTE — Progress Notes (Signed)
 Location:  [facility] Room Number: 109 Place of Service:  SNF ([id number])   CODE STATUS: full   Allergies  Allergen Reactions   Cortisone Other (See Comments)    Fluid retention and hives    Chief Complaint  Patient presents with   Discharge Note    HPI:  She is being discharged to home with home health for pt/ot. She will need a rolling walker. She will need her prescriptions written and will need to follow up with her medical provider. She had been hospitalized for her second hip fracture. She was admitted to this facility for short term rehab. Therapy: ambulate 150 feet with rolling walker and supervision; upper body mod assist lower body supervision;bpr: mod to supervision.   Past Medical History:  Diagnosis Date   Coronary artery disease    Endometriosis    Hyperlipidemia    Hypertension    Hypotension    Ruptured cervical disc    Skin cancer     Past Surgical History:  Procedure Laterality Date   ABDOMINAL HYSTERECTOMY     ANTERIOR APPROACH HEMI HIP ARTHROPLASTY Left 05/29/2016   Procedure: ANTERIOR APPROACH TOTAL HIP ARTHROPLASTY ;  Surgeon: Arnie Lao, MD;  Location: [facility] OR;  Service: Orthopedics;  Laterality: Left;   ARTERY BIOPSY Right 05/03/2019   Procedure: MINOR BIOPSY TEMPORAL ARTERY;  Surgeon: Awilda Bogus, MD;  Location: [facility] ORS;  Service: General;  Laterality: Right;   CARDIAC CATHETERIZATION     CATARACT EXTRACTION W/PHACO Right 06/18/2022   Procedure: CATARACT EXTRACTION PHACO AND INTRAOCULAR LENS PLACEMENT (IOC);  Surgeon: Tarri Farm, MD;  Location: [facility] ORS;  Service: Ophthalmology;  Laterality: Right;  CDE 115.90   CATARACT EXTRACTION W/PHACO Left 07/02/2022   Procedure: CATARACT EXTRACTION PHACO AND INTRAOCULAR LENS PLACEMENT (IOC);  Surgeon: Tarri Farm, MD;  Location: [facility] ORS;  Service: Ophthalmology;  Laterality: Left;  CDE: 5.85   CHOLECYSTECTOMY     HARDWARE REMOVAL Right 03/19/2024   Procedure: REMOVAL, HARDWARE;   Surgeon: Tonita Frater, MD;  Location: [facility] ORS;  Service: Orthopedics;  Laterality: Right;  Removal of 3 cannulated screws   HIP PINNING,CANNULATED Right 03/03/2024   Procedure: FIXATION, FEMUR, NECK, PERCUTANEOUS, USING SCREW;  Surgeon: Tonita Frater, MD;  Location: [facility];  Service: Orthopedics;  Laterality: Right;   INTRAMEDULLARY (IM) NAIL INTERTROCHANTERIC Right 03/19/2024   Procedure: FIXATION, FRACTURE, INTERTROCHANTERIC, WITH INTRAMEDULLARY ROD;  Surgeon: Tonita Frater, MD;  Location: [facility];  Service: Orthopedics;  Laterality: Right;   TONSILLECTOMY      Social History   Socioeconomic History   Marital status: Married    Spouse name: Not on file   Number of children: Not on file   Years of education: Not on file   Highest education level: Not on file  Occupational History   Not on file  Tobacco Use   Smoking status: Former    Current packs/day: 0.00    Types: Cigarettes    Quit date: 05/01/2019    Years since quitting: 4.9   Smokeless tobacco: Never  Vaping Use   Vaping status: Never Used  Substance and Sexual Activity   Alcohol use: Yes    Comment: occassional   Drug use: Not Currently   Sexual activity: Not on file  Other Topics Concern   Not on file  Social History Narrative   Not on file   Social Drivers of Health   Financial Resource Strain: Not on file  Food Insecurity: No Food  Insecurity (03/19/2024)   Hunger Vital Sign    Worried About Running Out of Food in the Last Year: Never true    Ran Out of Food in the Last Year: Never true  Transportation Needs: No Transportation Needs (03/19/2024)   PRAPARE - Administrator, Civil Service (Medical): No    Lack of Transportation (Non-Medical): No  Physical Activity: Not on file  Stress: Not on file  Social Connections: Moderately Isolated (03/19/2024)   Social Connection and Isolation Panel [NHANES]    Frequency of Communication with Friends and Family: More than three times a week    Frequency of  Social Gatherings with Friends and Family: More than three times a week    Attends Religious Services: Never    Database administrator or Organizations: No    Attends Banker Meetings: Never    Marital Status: Married  Catering manager Violence: Not At Risk (03/19/2024)   Humiliation, Afraid, Rape, and Kick questionnaire    Fear of Current or Ex-Partner: No    Emotionally Abused: No    Physically Abused: No    Sexually Abused: No   Family History  Problem Relation Age of Onset   Cancer Mother    Cancer Father       VITAL SIGNS BP 132/76   Pulse 74   Temp (!) 97.2 F (36.2 C)   Resp 20   Ht 5\' 4"  (1.626 m)   Wt 169 lb 6.4 oz (76.8 kg)   SpO2 98%   BMI 29.08 kg/m   Outpatient Encounter Medications as of 03/28/2024  Medication Sig   albuterol  (PROVENTIL ) (2.5 MG/3ML) 0.083% nebulizer solution Take 2.5 mg by nebulization 3 (three) times daily as needed for wheezing or shortness of breath.   albuterol  (VENTOLIN  HFA) 108 (90 Base) MCG/ACT inhaler Inhale 2 puffs into the lungs every 6 (six) hours as needed for wheezing or shortness of breath.   aspirin  EC 81 MG tablet Take 1 tablet (81 mg total) by mouth 2 (two) times daily. Swallow whole.   atorvastatin  (LIPITOR) 20 MG tablet Take 1 tablet (20 mg total) by mouth daily.   benzonatate  (TESSALON ) 100 MG capsule Take 100 mg by mouth daily.   cyanocobalamin 1000 MCG tablet Take 1,000 mcg by mouth daily.   ezetimibe  (ZETIA ) 10 MG tablet Take 10 mg by mouth daily.   ferrous sulfate  325 (65 FE) MG tablet Take 325 mg by mouth 3 (three) times a week. Monday Wednesday Friday   fluticasone  (FLONASE ) 50 MCG/ACT nasal spray Place 1 spray into both nostrils daily.   levothyroxine  (SYNTHROID ) 25 MCG tablet Take 25 mcg by mouth every morning.   montelukast  (SINGULAIR ) 10 MG tablet Take 10 mg by mouth daily.   oxyCODONE  (ROXICODONE ) 5 MG immediate release tablet Take 1 tablet (5 mg total) by mouth every 8 (eight) hours as needed for  breakthrough pain or severe pain (pain score 7-10).   No facility-administered encounter medications on file as of 03/28/2024.     SIGNIFICANT DIAGNOSTIC EXAMS  PREVIOUS   03-19-24: wbc 10.6; hgb 11.6; hct 32.6; mcv 92.1 plt 393; glucose 128; bun 6; creat 0.75; k+ 3.2; na++ 122; ca 8.8; gfr >60; tsh 0.964 03-21-24: wbc 8.2; hgb 9.0; hct 29.1; mcv 103.2 plt 273; glucose 124; bun 8; creat 0.85; k+ 4.3; na++ 131; ca 8.2; gfr >60; mag 1.8   TODAY  03-26-24: hgb 8.5/hct 26.2 glucose 94; bun 6; creat 0.82; k+ 4.1; na++ 135; ca 8.8;  gfr >60; mag 1.8   Review of Systems  Constitutional:  Negative for malaise/fatigue.  Respiratory:  Negative for cough and shortness of breath.   Cardiovascular:  Negative for chest pain, palpitations and leg swelling.  Gastrointestinal:  Negative for abdominal pain, constipation and heartburn.  Musculoskeletal:  Negative for back pain, joint pain and myalgias.  Skin: Negative.   Neurological:  Negative for dizziness.  Psychiatric/Behavioral:  The patient is not nervous/anxious.     Physical Exam Constitutional:      General: She is not in acute distress.    Appearance: She is well-developed. She is not diaphoretic.  Neck:     Thyroid: No thyromegaly.  Cardiovascular:     Rate and Rhythm: Normal rate and regular rhythm.     Pulses: Normal pulses.     Heart sounds: Normal heart sounds.  Pulmonary:     Effort: Pulmonary effort is normal. No respiratory distress.     Breath sounds: Normal breath sounds.  Abdominal:     General: Bowel sounds are normal. There is no distension.     Palpations: Abdomen is soft.     Tenderness: There is no abdominal tenderness.  Musculoskeletal:        General: Normal range of motion.     Cervical back: Neck supple.     Right lower leg: No edema.     Left lower leg: No edema.  Lymphadenopathy:     Cervical: No cervical adenopathy.  Skin:    General: Skin is warm and dry.  Neurological:     Mental Status: She is alert  and oriented to person, place, and time.  Psychiatric:        Mood and Affect: Mood normal.       ASSESSMENT/ PLAN:   Patient is being discharged with the following home health services:  pt/ot to evaluate and treat as indicated for gait balance strength adl training.   Patient is being discharged with the following durable medical equipment:  rolling walker   Patient has been advised to f/u with their PCP in 1-2 weeks to for a transitions of care visit.  Social services at their facility was responsible for arranging this appointment.  Pt was provided with adequate prescriptions of noncontrolled medications to reach the scheduled appointment .  For controlled substances, a limited supply was provided as appropriate for the individual patient.  If the pt normally receives these medications from a pain clinic or has a contract with another physician, these medications should be received from that clinic or physician only).    A 30 day supply of her prescription medications have been sent to [facility].   Time spent with patient: 40 minutes: home health; dme; medications.   Britt Candle NP [facility]   call [phone]

## 2024-04-24 ENCOUNTER — Other Ambulatory Visit (INDEPENDENT_AMBULATORY_CARE_PROVIDER_SITE_OTHER): Payer: Self-pay

## 2024-04-24 ENCOUNTER — Ambulatory Visit: Admitting: Orthopedic Surgery

## 2024-04-24 ENCOUNTER — Encounter: Payer: Self-pay | Admitting: Orthopedic Surgery

## 2024-04-24 DIAGNOSIS — S7221XD Displaced subtrochanteric fracture of right femur, subsequent encounter for closed fracture with routine healing: Secondary | ICD-10-CM

## 2024-04-24 DIAGNOSIS — S7221XA Displaced subtrochanteric fracture of right femur, initial encounter for closed fracture: Secondary | ICD-10-CM

## 2024-04-24 MED ORDER — HYDROCODONE-ACETAMINOPHEN 5-325 MG PO TABS: 1.0000 | ORAL_TABLET | Freq: Three times a day (TID) | ORAL | 0 refills | Status: AC | PRN

## 2024-04-24 NOTE — Progress Notes (Signed)
 Orthopaedic Postop Note  Assessment: [name] is a 68 y.o. female s/p cephalomedullary nail for Right subtrochanteric femur fracture (s/p CRPP R femoral neck fracture 03/03/24)  DOS: 03/19/2024  Plan: [name] is doing well.  Pain is improving.  She is working appropriately with physical therapy.  Radiographs are stable.  Anticipate continued improvement.  Walking is excellent exercise.  Weightbearing will help strengthen the bone.  Okay to transition from a walker to a cane.  Like see her back in 6 weeks.   Follow-up: Return in about 6 weeks (around 06/05/2024). XR at next visit: AP pelvis and Right femur  Subjective:  Chief Complaint  Patient presents with   Routine Post Op    R hip DOS 03/19/24    History of Present Illness: [name] is a 68 y.o. female who presents following the above stated procedure.  She is doing very well.  She is now at home.  She is doing well.  She is working with home health physical therapy.  She states that this causes some discomfort, but she is doing quite well.  She has been relying on ice, medicines at home.  No numbness or tingling.  She states the incisions are healing well.  Review of Systems: No fevers or chills No numbness or tingling No Chest Pain No shortness of breath   Objective: There were no vitals taken for this visit.  Physical Exam:  Alert and oriented.  No acute distress.    Steady gait while using a walker.  Surgical incisions healing well.  No surrounding erythema or drainage.  No tenderness.  She tolerates gentle range of motion of the right hip.  She is able to maintain a straight leg raise.  Toes warm and well-perfused.  Sensation intact to the dorsum of the foot.  IMAGING: I personally ordered and reviewed the following images:  AP pelvis and right femur x-rays were obtained in clinic today.  These are compared to prior x-rays.  Hardware remains intact.  There is no evidence of screws backing out.   Subtrochanteric femur fracture in stable alignment.  There is some callus formation.  No interval displacement.  No bony lesions.  Impression: Stable right subtrochanteric femur fracture without hardware failure.  Tonita Frater, MD 04/24/2024 2:36 PM

## 2024-04-25 ENCOUNTER — Encounter: Admitting: Orthopedic Surgery

## 2024-06-13 ENCOUNTER — Other Ambulatory Visit (INDEPENDENT_AMBULATORY_CARE_PROVIDER_SITE_OTHER): Payer: Self-pay

## 2024-06-13 ENCOUNTER — Encounter: Payer: Self-pay | Admitting: Orthopedic Surgery

## 2024-06-13 ENCOUNTER — Ambulatory Visit: Admitting: Orthopedic Surgery

## 2024-06-13 DIAGNOSIS — S7221XD Displaced subtrochanteric fracture of right femur, subsequent encounter for closed fracture with routine healing: Secondary | ICD-10-CM

## 2024-06-13 NOTE — Progress Notes (Unsigned)
 Orthopaedic Postop Note  Assessment: [name] is a 68 y.o. female s/p cephalomedullary nail for Right subtrochanteric femur fracture (s/p CRPP R femoral neck fracture 03/03/24)  DOS: 03/19/2024  Plan: [name] is doing well.  Pain is improving.  She is working appropriately with physical therapy.  Radiographs are stable.  Anticipate continued improvement.  Walking is excellent exercise.  Weightbearing will help strengthen the bone.  Okay to transition from a walker to a cane.  Like see her back in 6 weeks.   Follow-up: Return if symptoms worsen or fail to improve. XR at next visit: AP pelvis and Right femur  Subjective:  Chief Complaint  Patient presents with   Routine Post Op    R femur/hip DOS: 03/19/2024    History of Present Illness: [name] is a 68 y.o. female who presents following the above stated procedure.  She is doing very well.  She is now at home.  She is doing well.  She is working with home health physical therapy.  She states that this causes some discomfort, but she is doing quite well.  She has been relying on ice, medicines at home.  No numbness or tingling.  She states the incisions are healing well.  Review of Systems: No fevers or chills No numbness or tingling No Chest Pain No shortness of breath   Objective: There were no vitals taken for this visit.  Physical Exam:  Alert and oriented.  No acute distress.    Steady gait while using a walker.  Surgical incisions healing well.  No surrounding erythema or drainage.  No tenderness.  She tolerates gentle range of motion of the right hip.  She is able to maintain a straight leg raise.  Toes warm and well-perfused.  Sensation intact to the dorsum of the foot.  IMAGING: I personally ordered and reviewed the following images:  AP pelvis and right femur x-rays were obtained in clinic today.  These are compared to prior x-rays.  Hardware remains intact.  There is no evidence of screws backing out.   Subtrochanteric femur fracture in stable alignment.  There is some callus formation.  No interval displacement.  No bony lesions.  Impression: Stable right subtrochanteric femur fracture without hardware failure.  Oneil DELENA Horde, MD 06/13/2024 2:03 PM

## 2024-09-12 ENCOUNTER — Other Ambulatory Visit (HOSPITAL_COMMUNITY): Payer: Self-pay | Admitting: Adult Health

## 2024-09-12 DIAGNOSIS — M25512 Pain in left shoulder: Secondary | ICD-10-CM

## 2024-09-14 ENCOUNTER — Ambulatory Visit (HOSPITAL_COMMUNITY)
Admission: RE | Admit: 2024-09-14 | Discharge: 2024-09-14 | Disposition: A | Discharge status: Home / Self Care | Source: Ambulatory Visit | Attending: Adult Health | Admitting: Adult Health

## 2024-09-14 DIAGNOSIS — M25512 Pain in left shoulder: Secondary | ICD-10-CM | POA: Insufficient documentation

## 2024-09-19 ENCOUNTER — Other Ambulatory Visit (HOSPITAL_COMMUNITY): Payer: Self-pay

## 2024-09-19 ENCOUNTER — Ambulatory Visit (HOSPITAL_COMMUNITY)
Admission: RE | Admit: 2024-09-19 | Discharge: 2024-09-19 | Disposition: A | Discharge status: Home / Self Care | Source: Ambulatory Visit

## 2024-09-19 DIAGNOSIS — R053 Chronic cough: Secondary | ICD-10-CM | POA: Insufficient documentation

## 2024-10-02 ENCOUNTER — Encounter: Payer: Self-pay | Admitting: Orthopedic Surgery

## 2024-10-02 ENCOUNTER — Ambulatory Visit (INDEPENDENT_AMBULATORY_CARE_PROVIDER_SITE_OTHER): Admitting: Orthopedic Surgery

## 2024-10-02 VITALS — BP 135/78 | HR 80 | Ht 65.0 in | Wt 145.0 lb

## 2024-10-02 DIAGNOSIS — M19012 Primary osteoarthritis, left shoulder: Secondary | ICD-10-CM

## 2024-10-02 NOTE — Patient Instructions (Signed)

## 2024-10-02 NOTE — Progress Notes (Unsigned)
 Orthopaedic Clinic Return  Assessment: [name] is a 68 y.o. female with the following: Left glenohumeral arthritis  Plan: Mrs. Tyrell has advanced degenerative changes of the left shoulder joint.  Pain has gotten worse in recent months secondary to use of a walker.  Radiographs were reviewed with her in clinic today.  All questions have been answered.  I recommended a steroid injection, she elected to proceed.  Subacromial steroid injection was completed in clinic today.  If this is ineffective, or she continues to have issues, would recommend an ultrasound-guided injection.  All questions have been answered.  She is in agreement with this plan.  Procedure note injection Left shoulder    Verbal consent was obtained to inject the left shoulder, subacromial space Timeout was completed to confirm the site of injection.  The skin was prepped with alcohol and ethyl chloride was sprayed at the injection site.  A 21-gauge needle was used to inject 40 mg of Depo-Medrol and 1% lidocaine  (4 cc) into the subacromial space of the left shoulder using a posterolateral approach.  There were no complications. A sterile bandage was applied.   Follow-up: Return if symptoms worsen or fail to improve.   Subjective:  Chief Complaint  Patient presents with   Shoulder Pain    Left for a few months, therapy told her its from the walker and cane, they have adjusted the walker and cane but pain persists     History of Present Illness: [name] is a 68 y.o. female who returns to clinic for evaluation of left shoulder pain.  She is well-known to my clinic, having been treated for a right hip fracture.  She has been using a walker in recovery, states that this has contributed to her ongoing pain.  No prior history of an injury to the left shoulder.  Medications have been ineffective.  She has difficulty with overhead motion.  Review of Systems: No fevers or chills No numbness or tingling No chest  pain No shortness of breath No bowel or bladder dysfunction No GI distress No headaches   Objective: BP 135/78   Pulse 80   Ht 5' 5 (1.651 m)   Wt 145 lb (65.8 kg)   BMI 24.13 kg/m   Physical Exam:  Elderly female.  Alert and oriented.  No acute distress.  Left shoulder without deformity.  Sensation intact in the axillary nerve distribution.  Sensation intact throughout the left hand.  Limited external rotation at her side.  Forward flexion to 120 degrees with pain.  IMAGING: I personally ordered and reviewed the following images:  X-rays of the left shoulder were obtained in clinic today.  No acute injuries.  Complete loss of joint space within the glenohumeral joint.  There are inferior osteophytes.  There are small cysts within the humeral head, as well as the glenoid.  No bony lesions.  No dislocation.  Impression: Left glenohumeral joint arthritis  Oneil DELENA Horde, MD 10/02/2024 3:20 PM
